# Patient Record
Sex: Male | Born: 1935 | Race: White | Hispanic: No | Marital: Single | Smoking: Former smoker
Health system: Southern US, Community
[De-identification: ages and names within clinical notes are randomized; demographics above are authoritative.]

## PROBLEM LIST (undated history)

## (undated) DIAGNOSIS — C801 Malignant (primary) neoplasm, unspecified: Secondary | ICD-10-CM

## (undated) DIAGNOSIS — C649 Malignant neoplasm of unspecified kidney, except renal pelvis: Secondary | ICD-10-CM

## (undated) DIAGNOSIS — Z87442 Personal history of urinary calculi: Secondary | ICD-10-CM

## (undated) DIAGNOSIS — I639 Cerebral infarction, unspecified: Secondary | ICD-10-CM

## (undated) DIAGNOSIS — M1990 Unspecified osteoarthritis, unspecified site: Secondary | ICD-10-CM

## (undated) DIAGNOSIS — N400 Enlarged prostate without lower urinary tract symptoms: Secondary | ICD-10-CM

## (undated) DIAGNOSIS — K219 Gastro-esophageal reflux disease without esophagitis: Secondary | ICD-10-CM

## (undated) DIAGNOSIS — I10 Essential (primary) hypertension: Secondary | ICD-10-CM

## (undated) HISTORY — DX: Malignant (primary) neoplasm, unspecified: C80.1

## (undated) HISTORY — PX: JOINT REPLACEMENT: SHX530

## (undated) HISTORY — PX: CHOLECYSTECTOMY: SHX55

## (undated) HISTORY — DX: Benign prostatic hyperplasia without lower urinary tract symptoms: N40.0

## (undated) HISTORY — DX: Malignant neoplasm of unspecified kidney, except renal pelvis: C64.9

## (undated) HISTORY — DX: Essential (primary) hypertension: I10

## (undated) HISTORY — DX: Cerebral infarction, unspecified: I63.9

---

## 1971-07-21 HISTORY — PX: SKIN CANCER EXCISION: SHX779

## 2002-07-20 HISTORY — PX: LAPAROTOMY: SHX154

## 2007-08-15 ENCOUNTER — Inpatient Hospital Stay (HOSPITAL_COMMUNITY): Admission: RE | Admit: 2007-08-15 | Discharge: 2007-08-18 | Payer: Self-pay | Admitting: Orthopedic Surgery

## 2007-09-13 ENCOUNTER — Encounter: Payer: Self-pay | Admitting: Orthopedic Surgery

## 2007-09-18 ENCOUNTER — Encounter: Payer: Self-pay | Admitting: Orthopedic Surgery

## 2007-10-19 ENCOUNTER — Encounter: Payer: Self-pay | Admitting: Orthopedic Surgery

## 2010-12-02 NOTE — H&P (Signed)
NAMEYENG, PERZ NO.:  [id number]   MEDICAL RECORD NO.:  [id number]         PATIENT TYPE:  LINP   LOCATION:                               FACILITY:  [facility]   PHYSICIAN:  Madlyn Frankel. Charlann Boxer, M.D.  DATE OF BIRTH:  06-17-1932   DATE OF ADMISSION:  08/15/2007  DATE OF DISCHARGE:                              HISTORY & PHYSICAL   PROCEDURE:  Right total knee arthroplasty.   CHIEF COMPLAINT:  Right knee pain.   HISTORY OF PRESENT ILLNESS:  A 77 year old male with a history of right  knee pain secondary to osteoarthritis that has been refractory to all  conservative treatment.  He has been presurgically assessed by Dr. Diona Fanti.   PAST MEDICAL HISTORY:  Significant for:  1. Osteoarthritis.  2. Hypertension.  3. Tinnitus.  4. Hemorrhoids.  5. Kaposi's sarcoma.   PAST SURGICAL HISTORY:  Cancer, Kaposi's sarcoma surgery in 1972, right  knee surgery in 1953, cholecystectomy in 2005, right knee scope  approximately 15 years ago in 1993.   FAMILY MEDICAL HISTORY:  Coronary artery disease, kidney disease,  cancer.   SOCIAL HISTORY:  He is divorced, retired.  Primary caregiver after  surgery will be his sister, who is a retired Engineer, civil (consulting).   DRUG ALLERGIES:  None.   MEDICATIONS:  1. HCTZ 25 mg 1 p.o. daily.  2. Terazosin 10 mg p.o. daily.  3. Potassium chloride 20 mEq 2 tablets once daily.  4. Metoprolol 40 mg 1 tablet twice daily.  5. Ranitidine 150 mg p.r.n.  6. Lisinopril 40 mg 1 tablet daily.  7. Baby aspirin 81 mg p.o. daily.  8. Tylenol Extra Strength 2 daily p.r.n.  9. Beta carotene 25,000 units 1 a day.  10.Zinc 50 mg 1 daily.  11.Multivitamin 1 daily.   REVIEW OF SYSTEMS:  None other than in HPI.   PHYSICAL EXAMINATION:  VITAL SIGNS:  Pulse 72, respirations 18, blood  pressure 136/84.  GENERAL:  Awake, alert and oriented, well-developed, well-nourished, in  no acute distress.  NECK:  Supple.  No carotid bruits.  CHEST/LUNGS:  Clear to  auscultation bilaterally.  BREASTS:  Deferred.  HEART:  Regular rate and rhythm without gallops, clicks, rubs or  murmurs.  ABDOMEN:  Soft, nontender, nondistended.  Bowel sounds present.  GENITOURINARY:  Deferred.  EXTREMITIES:  Dorsalis pedis pulse positive of right lower extremity.  He had varus deformity.  He does flex back to 110 degrees.  SKIN:  No cellulitis.  NEUROLOGIC:  Intact distal sensibilities.   LABORATORIES:  UA negative for leukocyte esterase, rare squamous  epithelial with few bacteria.  Basic metabolic showed calcium 8.4,  creatinine 0.8 with GFR greater than 60.  PSA 1.26.  Others pending.   EKG normal sinus rhythm.   Chest x-ray pending.   IMPRESSION:  Right knee osteoarthritis.   PLAN OF ACTION:  Right total knee replacement August 15, 2007, at  [facility] by surgeon Dr. Durene Romans.  Risks and  complications were discussed.   Postoperative medications including Lovenox, Robaxin, iron, aspirin,  Colace, MiraLax provided  at time of history and physical.  Postoperative  pain medication will be provided at time of surgery.     ______________________________  Yetta Glassman [address], [place]      Madlyn Frankel. Charlann Boxer, M.D.  Electronically Signed    BLM/MEDQ  D:  08/03/2007  T:  08/03/2007  Job:  [id number]   cc:   Diona Fanti  Fax: [phone]

## 2010-12-02 NOTE — Op Note (Signed)
NAMENORWOOD, QUEZADA NO.:  [id number]   MEDICAL RECORD NO.:  [id number]          PATIENT TYPE:  INP   LOCATION:  0006                         FACILITY:  [facility]   PHYSICIAN:  Madlyn Frankel. Charlann Boxer, M.D.  DATE OF BIRTH:  07-01-1932   DATE OF PROCEDURE:  08/15/2007  DATE OF DISCHARGE:                               OPERATIVE REPORT   PREOPERATIVE DIAGNOSIS:  Right knee osteoarthritis.   POSTOPERATIVE DIAGNOSIS:  Right knee osteoarthritis.   PROCEDURE:  Right total knee replacement.   COMPONENTS USED:  DePuy rotating platform posterior stabilized knee  system size 5 femur, 4 tibia, 20 mm insert and 41 patellar button.   SURGEON:  Madlyn Frankel. Charlann Boxer, M.D.   ASSISTANT:  Yetta Glassman. Mann, PA.   ANESTHESIA:  General plus regional femoral block.   COMPLICATIONS:  None.   DRAINS:  One.   TOURNIQUET TIME:  62 minutes at 250 mmHg.   INDICATIONS FOR PROCEDURE:  Mr. Knight is a 77 year old gentleman who  presented with bilateral severe knee arthritis, right more symptomatic  that the left. He had failed conservative measures and wished to discuss  knee replacement surgery. Risks of infection, DVT, component failure,  need for revision, postop stiffness, limited preoperative range of  motion were all discussed and reviewed in hopes of anticipation of pain  relief.   PROCEDURE IN DETAIL:  The patient was brought to the operative theater.  Once adequate anesthesia, preoperative antibiotics, Ancef, administered,  the patient was positioned supine, proximal thigh tourniquet was placed.  The patient noted to have at least 10 degree flexion contracture on  examination with varus deformity due to his severe knee arthritis.  The  leg was prescrubbed then prepped and draped in sterile fashion.  Midline  incision was made, followed by median arthrotomy revealing severe  degenerative changes patellofemoral and tricompartmentally.  Following  initial exposure, I did attend to the  patella.  First precut measurement  was approximately 28 mm.  I resected down to about 15 mm and used a 41  patellar button.  I took time at this point to expose and  debride  synovium.  Attention was now directed to the femur which was noted to be  severely arthritic.  I used an osteotome to debride the notch  osteophytes, remove cruciate ligaments.   I used the starting drill to open up the femoral canal, irrigated to  prevent fat emboli and then passed the intramedullary rod.  At  5  degrees valgus, I resected 12 mm of bone due to the flexion contractures  present. At this point, I sized the femur to be a size 5.  I placed the  cutting block based off posterior condylar axis which matched  perpendicular to [employer].  At this point, the anterior, posterior  and chamfer cuts were all made.  There is no notching.  At this point, I  made the box cut based off the lateral aspect of the distal femur,  removed the remaining osteophytes off the distal, medial and lateral  aspects of the femur.  Once  finished with the femur, I attended to the  tibia.  Tibial exposure obtained.  The patient was noted to have severe  posterior medial wear.  Based on the deformity that was present, I made  a choice at this point and resected bone based the medial side.  I set  the cutting block at 2 off the posterior medial aspect of the knee base  off the central portion of medial side.  When I made this resection it  was evident that the extension gap would then easily meet 10 mm.   At this point, I removed the medial osteophytes from the medial and  posterior medial aspect of the tibia.  I removed osteophytes off the  backside of the knee.  I used an osteotome to debride posterior medial  and posterior lateral osteophytes off the femoral condyles.   I finished off the tibial preparation using a size 4 tray.  The cut was  perpendicular in both planes, checked with an alignment rod.  I drilled  a keel  punch and did a trial reduction.  In doing this trial reduction,  then I felt that I was going to need at least 17.5 versus 20 mm poly.  At this point, all trial components were removed, the synovial capsule  layer was injected with 60 mL of 25% Marcaine with epinephrine and 1 mL  of Toradol.  I irrigated the knee out copiously with pulse lavage.  Further debridements carried out as necessary as final components were  opened.  At this point, cement was mixed.  The components were cemented  in position with the tibia first, then femur and patella.  I brought the  knee out to extension with 17.5, made the decision at this point that I  would use a 20 mm poly.   Once cement had cured, then I debrided the remaining cement from the  posterior aspect of the knee and medially and laterally, the final 20 mm  insert was placed.   The knee was very stable from extension to flexion, came out to  extension removing the patient's previous flexion gap.  At this point,  the knee was reirrigated, a medium Hemovac drain was placed deep.  I  reapproximated the extensor mechanism using #1 Vicryl with the knee in  flexion.  The remainder of the wound was closed in layers with 2-0  Vicryl and 4-0 running Monocryl.  The knee was cleaned, dried and  dressed sterilely with Steri-Strips and a bulky sterile wrap.  He was  brought to the recovery room extubated in stable condition.      Madlyn Frankel Charlann Boxer, M.D.  Electronically Signed     MDO/MEDQ  D:  08/15/2007  T:  08/15/2007  Job:  [id number]

## 2010-12-05 NOTE — Discharge Summary (Signed)
[name], [name] NO.:  [id number]   MEDICAL RECORD NO.:  [id number]          PATIENT TYPE:  INP   LOCATION:  1611                         FACILITY:  [facility]   PHYSICIAN:  Madlyn Frankel. Charlann Boxer, M.D.  DATE OF BIRTH:  06-Dec-1931   DATE OF ADMISSION:  08/15/2007  DATE OF DISCHARGE:  08/18/2007                               DISCHARGE SUMMARY   ADMISSION DIAGNOSES:  1. Osteoarthritis.  2. Hypertension.  3. Tinnitus.  4. Hemorrhoids.  5. Kaposi's sarcoma.   DISCHARGE DIAGNOSES:  1. Osteoarthritis.  2. Hypertension.  3. Tinnitus.  4. Hemorrhoids.  5. Kaposi's sarcoma.  6. Postoperative hypokalemia.   HISTORY OF PRESENT ILLNESS:  A 77 year old male with a history of right  knee pain secondary to osteoarthritis that has been refractory to all  conservative treatment.   CONSULTATIONS:  None.   PROCEDURE:  Right total knee replacement by Madlyn Frankel. Charlann Boxer, M.D. and  assistant Yetta Glassman. Mann, PA.   LABORATORY DATA:  Admission CBC; hematocrit 45.3 and at discharge was  stable at 28.5.  White cell differential was normal.  Coags; PTT 33, INR  1.  Routine chemistry preadmission; sodium 141, potassium 3.4, glucose  97, creatinine 0.79.  He did have a slight bump up in his glucose to 147  at discharge.  Sodium 138, potassium 3.2, glucose 128, creatinine 0.92.  Kidney function; GFR remained greater than 60, his calcium was 7.6 at  discharge.  UA was negative.   RADIOLOGY:  Chest two view; no active disease.   CARDIOLOGY:  EKG; normal sinus rhythm.   HOSPITAL COURSE:  The patient underwent a right total knee replacement,  tolerated the procedure well, and was admitted to the orthopedic floor.  His pain was well controlled throughout his course of stay.  He was  afebrile.  On day #1 hematocrit was 32.1, Hemovac was discontinued.  He  was neurovascularly intact.  Quads would fire but no straight leg raise.  Weightbearing as tolerated.  Evaluation showed that he would  do well  with home health care.   Postoperative activity no complaints with good progress.  A little dip  down in the potassium was replenished with K-Dur.  Dressing was changed  with no active drainage.  Quads were firing well.  Still was unable to  do a straight leg raise.  Weightbearing as tolerated with plans for  discharge the next day with excellent progress.   Postoperative day #3 doing well, no events, dressing was changed.  No  active drainage.  Neurovascularly intact in the right lower extremity.  Good quad function.   DISPOSITION:  Discharged home with home health care PT.   DISCHARGE PHYSICAL THERAPY:  Weightbearing as tolerated with the use of  rolling walker.  Goals of range of motion; 0 to 100 at two weeks, 0 to  120 at six weeks.   DIET:  Regular as tolerated by the patient.   WOUND CARE:  Keep wound dry.   DISCHARGE MEDICATIONS:  1. Lovenox.  2. Robaxin.  3. Aspirin.  4. Iron.  5. Colace.  6. MiraLax.  7. Vicodin.   HOME MEDICATIONS:  1. Metoprolol 50 mg one p.o. b.i.d.  2. Lisinopril 40 mg one p.o. q.a.m.  3. Hydrochlorothiazide 25 mg one p.o. daily.  4. Terazosin 10 mg one p.o. q.h.s.  5. Potassium 20 mEq two p.o. q.a.m.  6. Betacarotene 25,000 one p.o. q.a.m.  7. Zinc 50 mg one p.o. q.a.m.  8. Vitamin B6 100 mg one p.o. q.a.m.  9. Vitamin A 200 units one p.o. q.a.m.  10.Multivitamin daily.  11.Celebrex 200 mg one p.o. q.a.m. and one p.o. q.p.m. x2 weeks.   FOLLOWUP:  Follow up with Dr. Charlann Boxer, phone number [phone] in two weeks.     ______________________________  Yetta Glassman [address], [place]      Madlyn Frankel. Charlann Boxer, M.D.  Electronically Signed    BLM/MEDQ  D:  09/20/2007  T:  09/20/2007  Job:  [id number]   cc:   Diona Fanti  Fax: [phone]

## 2011-04-10 LAB — CBC
HCT: 28.5 — ABNORMAL LOW
HCT: 32.1 — ABNORMAL LOW
Hemoglobin: 11.1 — ABNORMAL LOW
Hemoglobin: 15.6
MCHC: 34.6
MCV: 85.8
Platelets: 132 — ABNORMAL LOW
Platelets: 162
RBC: 5.28
RDW: 12.7
RDW: 12.9
WBC: 6.7

## 2011-04-10 LAB — BASIC METABOLIC PANEL
BUN: 19
BUN: 20
CO2: 33 — ABNORMAL HIGH
Calcium: 7.6 — ABNORMAL LOW
Calcium: 8.7
Chloride: 102
Creatinine, Ser: 0.79
Creatinine, Ser: 0.92
GFR calc Af Amer: >60
GFR calc non Af Amer: >60
GFR calc non Af Amer: >60
GFR calc non Af Amer: >60
Glucose, Bld: 128 — ABNORMAL HIGH
Glucose, Bld: 147 — ABNORMAL HIGH
Potassium: 3.6
Sodium: 139
Sodium: 141

## 2011-04-10 LAB — URINALYSIS, ROUTINE W REFLEX MICROSCOPIC
Bilirubin Urine: NEGATIVE
Hgb urine dipstick: NEGATIVE
Ketones, ur: NEGATIVE
Nitrite: NEGATIVE
Specific Gravity, Urine: 1.019
pH: 6

## 2011-04-10 LAB — DIFFERENTIAL
Basophils Absolute: 0.1
Lymphocytes Relative: 13
Monocytes Absolute: 0.7
Monocytes Relative: 8
Neutro Abs: 5.9
Neutrophils Relative %: 73

## 2011-04-10 LAB — TYPE AND SCREEN: ABO/RH(D): AB POS

## 2011-04-10 LAB — ABO/RH: ABO/RH(D): AB POS

## 2011-04-10 LAB — PROTIME-INR: INR: 1

## 2011-04-10 LAB — APTT: aPTT: 33

## 2011-09-11 ENCOUNTER — Emergency Department: Payer: Self-pay | Admitting: Emergency Medicine

## 2014-07-09 ENCOUNTER — Inpatient Hospital Stay: Payer: Self-pay | Admitting: Internal Medicine

## 2014-07-09 LAB — COMPREHENSIVE METABOLIC PANEL
ALK PHOS: 109 U/L
AST: 28 U/L (ref 15–37)
Albumin: 3.3 g/dL — ABNORMAL LOW (ref 3.4–5.0)
Anion Gap: 7 (ref 7–16)
BUN: 24 mg/dL — ABNORMAL HIGH (ref 7–18)
Bilirubin,Total: 0.4 mg/dL (ref 0.2–1.0)
CREATININE: 0.89 mg/dL (ref 0.60–1.30)
Calcium, Total: 8.2 mg/dL — ABNORMAL LOW (ref 8.5–10.1)
Chloride: 106 mmol/L (ref 98–107)
Co2: 29 mmol/L (ref 21–32)
EGFR (Non-African Amer.): >60
Glucose: 108 mg/dL — ABNORMAL HIGH (ref 65–99)
OSMOLALITY: 288 (ref 275–301)
POTASSIUM: 3.2 mmol/L — AB (ref 3.5–5.1)
SGPT (ALT): 25 U/L
Sodium: 142 mmol/L (ref 136–145)
TOTAL PROTEIN: 6.6 g/dL (ref 6.4–8.2)

## 2014-07-09 LAB — CBC WITH DIFFERENTIAL/PLATELET
BASOS PCT: 1.4 %
Basophil #: 0.1 10*3/uL (ref 0.0–0.1)
Eosinophil #: 0.4 10*3/uL (ref 0.0–0.7)
Eosinophil %: 5.1 %
HCT: 46.8 % (ref 40.0–52.0)
HGB: 15.6 g/dL (ref 13.0–18.0)
Lymphocyte #: 1.5 10*3/uL (ref 1.0–3.6)
Lymphocyte %: 20.6 %
MCH: 28.4 pg (ref 26.0–34.0)
MCHC: 33.4 g/dL (ref 32.0–36.0)
MCV: 85 fL (ref 80–100)
MONO ABS: 0.7 x10 3/mm (ref 0.2–1.0)
Monocyte %: 9 %
NEUTROS ABS: 4.7 10*3/uL (ref 1.4–6.5)
Neutrophil %: 63.9 %
Platelet: 211 10*3/uL (ref 150–440)
RBC: 5.51 10*6/uL (ref 4.40–5.90)
RDW: 13.4 % (ref 11.5–14.5)
WBC: 7.3 10*3/uL (ref 3.8–10.6)

## 2014-07-09 LAB — URINALYSIS, COMPLETE
Bacteria: NONE SEEN
Bilirubin,UR: NEGATIVE
Glucose,UR: NEGATIVE mg/dL (ref 0–75)
Ketone: NEGATIVE
Leukocyte Esterase: NEGATIVE
NITRITE: NEGATIVE
PH: 6 (ref 4.5–8.0)
Protein: 30
RBC,UR: 92 /HPF (ref 0–5)
SQUAMOUS EPITHELIAL: NONE SEEN
Specific Gravity: 1.021 (ref 1.003–1.030)
WBC UR: 2 /HPF (ref 0–5)

## 2014-07-09 LAB — PROTIME-INR
INR: 1
PROTHROMBIN TIME: 12.8 s (ref 11.5–14.7)

## 2014-07-09 LAB — APTT: Activated PTT: 27.7 secs (ref 23.6–35.9)

## 2014-07-09 LAB — TROPONIN I

## 2014-07-09 LAB — MAGNESIUM: MAGNESIUM: 2.2 mg/dL

## 2014-07-10 LAB — CBC WITH DIFFERENTIAL/PLATELET
BASOS ABS: 0.1 10*3/uL (ref 0.0–0.1)
Basophil %: 1.7 %
EOS PCT: 4.5 %
Eosinophil #: 0.3 10*3/uL (ref 0.0–0.7)
HCT: 42.7 % (ref 40.0–52.0)
HGB: 14.3 g/dL (ref 13.0–18.0)
LYMPHS PCT: 15.4 %
Lymphocyte #: 1.1 10*3/uL (ref 1.0–3.6)
MCH: 28.5 pg (ref 26.0–34.0)
MCHC: 33.4 g/dL (ref 32.0–36.0)
MCV: 85 fL (ref 80–100)
MONO ABS: 0.7 x10 3/mm (ref 0.2–1.0)
Monocyte %: 10.1 %
NEUTROS ABS: 5.1 10*3/uL (ref 1.4–6.5)
Neutrophil %: 68.3 %
Platelet: 170 10*3/uL (ref 150–440)
RBC: 5.01 10*6/uL (ref 4.40–5.90)
RDW: 13.7 % (ref 11.5–14.5)
WBC: 7.4 10*3/uL (ref 3.8–10.6)

## 2014-07-10 LAB — LIPID PANEL
Cholesterol: 85 mg/dL (ref 0–200)
HDL Cholesterol: 23 mg/dL — ABNORMAL LOW (ref 40–60)
Ldl Cholesterol, Calc: 34 mg/dL (ref 0–100)
Triglycerides: 141 mg/dL (ref 0–200)
VLDL CHOLESTEROL, CALC: 28 mg/dL (ref 5–40)

## 2014-07-10 LAB — BASIC METABOLIC PANEL
ANION GAP: 7 (ref 7–16)
BUN: 21 mg/dL — ABNORMAL HIGH (ref 7–18)
CO2: 28 mmol/L (ref 21–32)
Calcium, Total: 8 mg/dL — ABNORMAL LOW (ref 8.5–10.1)
Chloride: 106 mmol/L (ref 98–107)
Creatinine: 0.71 mg/dL (ref 0.60–1.30)
EGFR (African American): >60
EGFR (Non-African Amer.): >60
Glucose: 85 mg/dL (ref 65–99)
Osmolality: 283 (ref 275–301)
Potassium: 2.7 mmol/L — ABNORMAL LOW (ref 3.5–5.1)
SODIUM: 141 mmol/L (ref 136–145)

## 2014-07-11 LAB — BASIC METABOLIC PANEL
Anion Gap: 6 — ABNORMAL LOW (ref 7–16)
BUN: 14 mg/dL (ref 7–18)
CALCIUM: 8 mg/dL — AB (ref 8.5–10.1)
CHLORIDE: 109 mmol/L — AB (ref 98–107)
Co2: 27 mmol/L (ref 21–32)
Creatinine: 0.74 mg/dL (ref 0.60–1.30)
EGFR (African American): >60
EGFR (Non-African Amer.): >60
GLUCOSE: 107 mg/dL — AB (ref 65–99)
OSMOLALITY: 284 (ref 275–301)
Potassium: 3 mmol/L — ABNORMAL LOW (ref 3.5–5.1)
Sodium: 142 mmol/L (ref 136–145)

## 2014-07-26 DIAGNOSIS — R531 Weakness: Secondary | ICD-10-CM | POA: Insufficient documentation

## 2014-07-26 DIAGNOSIS — R4701 Aphasia: Secondary | ICD-10-CM | POA: Insufficient documentation

## 2014-07-26 DIAGNOSIS — Z8673 Personal history of transient ischemic attack (TIA), and cerebral infarction without residual deficits: Secondary | ICD-10-CM | POA: Insufficient documentation

## 2014-08-20 DIAGNOSIS — E7800 Pure hypercholesterolemia, unspecified: Secondary | ICD-10-CM | POA: Insufficient documentation

## 2014-09-13 ENCOUNTER — Encounter: Payer: Self-pay | Admitting: Family Medicine

## 2014-10-08 ENCOUNTER — Ambulatory Visit: Payer: Self-pay | Admitting: Urology

## 2014-10-08 ENCOUNTER — Encounter
Admit: 2014-10-08 | Disposition: A | Discharge status: Home / Self Care | Payer: Self-pay | Attending: Family Medicine | Admitting: Family Medicine

## 2014-10-19 ENCOUNTER — Encounter
Admit: 2014-10-19 | Disposition: A | Discharge status: Home / Self Care | Payer: Self-pay | Attending: Family Medicine | Admitting: Family Medicine

## 2014-11-10 NOTE — H&P (Signed)
PATIENT NAME:  [name] [name], [name] [name] MR#:  [id number] DATE OF BIRTH:  Sep 08, 1931  DATE OF ADMISSION:  07/09/2014  REFERRING PHYSICIAN: Brunilda Payor A. Edd Fabian, MD    PRIMARY CARE PHYSICIAN: Dr. [facility].   CHIEF COMPLAINT: Slurred speech.    HISTORY OF PRESENT ILLNESS: An 81 year old Caucasian gentleman with a history of essential hypertension presenting with acute onset of slurred speech, which occurred about 24 hours prior to arrival to the Emergency Department. It occurred while he was at church.  He noted no further symptoms including paresthesias or weakness, numbness.  His speech was still abnormal and at the assistance of his family he decided to present to the hospital for further workup and evaluation. On initial workup was found to have the stroke of the right caudate nucleus on a head CT. Denies of any further symptoms. States speech is still slurred but somewhat improved from original onset. No further complaints at this time.   REVIEW OF SYSTEMS:   CONSTITUTIONAL: Denies any fevers, chills, fatigue, weakness.  EYES: Denies blurred vision, double vision, or eye pain.  EARS, NOSE, THROAT: Denies tinnitus, ear pain, hearing loss.  RESPIRATORY:  Denies cough, wheeze, shortness of breath.    CARDIOVASCULAR: Denies chest pain, palpitation, edema.  GASTROINTESTINAL: Denies nausea, vomiting, diarrhea, abdominal pain.  GENITOURINARY: Denies any diarrhea or hematuria.  ENDOCRINE: Denies nocturia, thyroid problems. HEMATOLOGIC AND LYMPHATIC:  Denies easy bruising or bleeding.  SKIN: Denies rash or lesions.  MUSCULOSKELETAL: Denies pain in neck, back, shoulder, knees, hips or arthritic symptoms.  NEUROLOGIC: Denies paralysis, paresthesias.  PSYCHIATRIC: Denies any anxiety or depressive symptoms.  Otherwise full review of systems performed by me is negative.    PAST MEDICAL HISTORY: Indigestion for which she takes as needed medication as well as essential hypertension.   SOCIAL HISTORY:  Denies any alcohol, tobacco, or drug usage.   FAMILY HISTORY: Positive for coronary artery disease.   ALLERGIES: PROPOXYPHENE.   HOME MEDICATIONS: Include aspirin 81 mg p.o. daily, Tylenol 500 mg p.o. b.i.d., lisinopril 40 mg p.o. daily, Flomax 0.4 mg p.o. daily, metoprolol 100 mg p.o. b.i.d., hydrochlorothiazide 25 mg p.o. daily, ranitidine 150 mg p.o. b.i.d. as needed for indigestion, potassium 20 mEq p.o. b.i.d., gluconate 50 mg p.o. daily, beta carotene 25,000 unit capsules once daily.   PHYSICAL EXAMINATION: Sign  VITAL SIGNS:  Temperature 98.1, heart rate 62, respirations 22, blood pressure 194/93, saturating 96% on room air, weight 96.3 kg, BMI of 31.4.  GENERAL: Well-nourished, well-developed Caucasian gentleman currently in no acute distress.  HEAD: Normocephalic, atraumatic.  EYES: Pupils equal, round, reactive to light. Extraocular muscles intact. No scleral icterus.  MOUTH: Moist mucosal membrane. Dentition intact. No abscess noted.  EAR, NOSE, THROAT: Clear without exudates. No external lesions.  NECK: Supple. No thyromegaly. No nodules. No JVD.  PULMONARY: Clear to auscultation bilaterally without wheeze, rales, rhonchi. No use of accessory muscles. Good respiratory effort.  CHEST: Nontender to palpation.  CARDIOVASCULAR: S1, S2, regular rate, rhythm. No murmurs, rubs, gallops. No edema. Pedal pulses 2+ bilaterally. GASTROINTESTINAL: Soft, nontender, nondistended. No masses. Positive bowel sounds. No hepatosplenomegaly.  MUSCULOSKELETAL: No swelling, clubbing, or edema. Range of motion full in all extremities.  NEUROLOGIC: Cranial nerves II through XII are intact. Sensation intact. Reflexes intact. pronator drift within normal limits.  Strength 5/5 in all extremities including proximal and distal flexion and extension. He does however have dysarthria with slurred speech.   SKIN: No ulceration, lesions, rash, cyanosis. Skin warm and dry. Turgor intact.  PSYCHIATRIC: Mood,  affect  within normal limits.  alert and oriented x 3. Insight, judgment intact.   LABORATORY DATA: Sodium 142, potassium 3.2, chloride 106, bicarbonate 29, BUN 24, creatinine 0.89, glucose 108,LFTs: Albumin 3.3, otherwise, within normal limits. Troponin less than 0.02. WBC 7.3, hemoglobin is 15.6, platelets of 211. CT head performed, which reveals atrophy with supratentorial small vessel disease, age uncertain, focal infarct in the head of the caudate nucleus on the right. No appreciable hemorrhage or mass effect.   ASSESSMENT AND PLAN:  An 81 year old gentleman with a history of hypertension essential presenting with slurred speech. He was found to have a stroke on head CT.  1.  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 Initiate aspirin and statin therapy. Place on telemetry. Neuro checks q. 4 hours. For risk factor modification and searching for etiology, check a transthoracic echocardiogram, a lipid panel, carotid Doppler. We will get physical therapy and speech therapy involved.  2.  Essential hypertension. We will allow permissive hypertension in the first 24 to 48 hours.  Treat him only if blood pressure greater than 220/120 or symptomatic at that time. Hydralazine 10 mg IV.  3.  Hypokalemia. Replace to goal of 4. We will also check a magnesium level with goal magnesium being around 2. Ex  4.  Venous thromboembolism prophylaxis with sequential compression devices.  We will avoid heparin in the first 24 to 48 hours.    CODE STATUS: The patient is full code.   TIME SPENT: 45 minutes.   ____________________________ Aaron Mose. Hower, MD dkh:AT D: 07/09/2014 22:26:07 ET T: 07/09/2014 23:04:03 ET JOB#: [id number]  cc: Aaron Mose. Hower, MD, <Dictator> DAVID Woodfin Ganja MD ELECTRONICALLY SIGNED 07/10/2014 1:50

## 2014-11-10 NOTE — Consult Note (Signed)
Referring Physician:  Lytle Butte :   Reason for Consult: Admit Date: 09-Jul-2014  Chief Complaint: "I was at church and noticed that my voice was all slurred"  Reason for Consult: CVA   History of Present Illness: History of Present Illness:   Mr. Tigges is an 81 yo RIGHT-handed man with PMH significant only for hypertension who presented for evaluation of sudden onset dysarthria two days ago. He was in church and after the service noticed that his voice was slurred as though he had been drinking. He did not notice any problems with balance or coordination, vertigo, diplopia, weakness, or numbness. He has moderate baseline tinnitus that did not significantly change. He disregarded these symptoms until he spoke with his son on the phone the next day, who noticed the patient's dysarthria and insisted that his father present for medical evaluation. He has never before had any similar symptoms.  ROS:  General denies complaints   HEENT no complaints   Lungs no complaints   Cardiac no complaints   GI no complaints   GU no complaints   Musculoskeletal joint pain   Extremities no complaints   Skin no complaints   Neuro dysarthria   Endocrine no complaints   Psych no complaints   Past Medical/Surgical Hx:  hypertension:   Cholecystectomy:   knee surgery:   Home Medications: Medication Instructions Last Modified Date/Time  hydrochlorothiazide 25 mg oral tablet 1 tab(s) orally once a day (in the morning) 21-Dec-15 20:34  potassium chloride 20 mEq oral tablet, extended release 1 tab(s) orally 2 times a day 21-Dec-15 20:34  Metoprolol Tartrate 100 mg oral tablet 1 tab(s) orally 2 times a day 21-Dec-15 20:34  lisinopril 40 mg oral tablet 1 tab(s) orally once a day (in the morning) 21-Dec-15 20:34  ranitidine 150 mg oral tablet 1 tab(s) orally 2 times a day, As Needed - for Indigestion, Heartburn 21-Dec-15 20:34  aspirin 81 mg oral tablet 1 tab(s) orally once a day (in the  morning) 21-Dec-15 20:34  Tylenol 500 mg oral tablet 1 tab(s) orally 2 times a day 21-Dec-15 20:34  beta-carotene 25000 units oral capsule 1 cap(s) orally once a day (in the morning) 21-Dec-15 20:34  zinc gluconate 50 mg oral tablet 1 tab(s) orally once a day (in the morning) 21-Dec-15 20:34  tamsulosin 0.4 mg oral capsule 1 cap(s) orally once a day (in the evening) 21-Dec-15 20:34   Allergies:  Propoxyphene: GI Distress  Social/Family History: Social History: Denies smoking, EtOH, or illicit drug use.  Family History: No FH of stroke or early MI.   Vital Signs: **Vital Signs.:   22-Dec-15 12:00  Vital Signs Type Routine  Temperature Temperature (F) 97.3  Celsius 36.2  Temperature Source oral  Pulse Pulse 73  Respirations Respirations 18  Systolic BP Systolic BP 099  Diastolic BP (mmHg) Diastolic BP (mmHg) 83  Mean BP 110  Pulse Ox % Pulse Ox % 94  Pulse Ox Activity Level  At rest  Oxygen Delivery Room Air/ 21 %   EXAM: Well-developed, well-nourished, in NAD. No conjunctival injection or scleral edema. Oropharynx clear. No carotid bruits auscultated. Normal S1, S2 and regular cardiac rhythm on exam. Lungs clear to auscultation bilaterally. Abdomen soft and nontender. Peripheral pulses palpated. No clubbing, cyanosis, or edema in extremities.  MENTAL STATUS: Alert and oriented to person, place, and time. Language fluent and appropriate. Cognition and memory conversationally intact. CRANIAL NERVES: Visual fields full to confrontation. PERRL. EOMI. Facial sensation intact. Facial muscles full and  symmetric. Hearing intact to finger rub. Uvula midline with symmetric palatal elevation. Tongue midline without fasciculations. Speech is notable for scanning dysarthria. MOTOR: Normal bulk and tone. Strength 5/5 in deltoids, biceps, triceps, wrist flexors and extensors, and hand intrinsics bilaterally. Strength 5/5 in iliopsoas, glutes, hamstrings, quads, and tib ant bilaterally. REFLEXES:  1+ in biceps, triceps, absent patella, and 1+ achilles bilaterally. Flexor plantar responses bilaterally. SENSORY: Intact to vibration and pinprick throughout. COORDINATION: Mild dysmetria and ataxia on right finger-nose and heel-shin maneuvers, normal coordination on the left. GAIT: Not evaluated.  NIH Stroke Scale Score: 3 (2 for ataxia in 2 limbs, 1 for dysarthria).  Lab Results: LabObservation:  22-Dec-15 08:52   OBSERVATION Reason for Test  Hepatic:  21-Dec-15 17:36   Bilirubin, Total 0.4  Alkaline Phosphatase 109 (46-116 NOTE: New Reference Range 02/06/14)  SGPT (ALT) 25 (14-63 NOTE: New Reference Range 02/06/14)  SGOT (AST) 28  Total Protein, Serum 6.6  Albumin, Serum  3.3  Routine Chem:  21-Dec-15 17:36   Magnesium, Serum 2.2 (1.8-2.4 THERAPEUTIC RANGE: 4-7 mg/dL TOXIC: > 10 mg/dL  -----------------------)  22-Dec-15 04:45   Cholesterol, Serum 85  Triglycerides, Serum 141  HDL (INHOUSE)  23  VLDL Cholesterol Calculated 28  LDL Cholesterol Calculated 34 (Result(s) reported on 10 Jul 2014 at 05:33AM.)  Glucose, Serum 85  BUN  21  Creatinine (comp) 0.71  Sodium, Serum 141  Potassium, Serum  2.7  Chloride, Serum 106  CO2, Serum 28  Calcium (Total), Serum  8.0  Anion Gap 7  Osmolality (calc) 283  eGFR (African American) >60  eGFR (Non-African American) >60 (eGFR values <20m/min/1.73 m2 may be an indication of chronic kidney disease (CKD). Calculated eGFR, using the MRDR Study equation, is useful in  patients with stable renal function. The eGFR calculation will not be reliable in acutely ill patients when serum creatinine is changing rapidly. It is not useful in patients on dialysis. The eGFR calculation may not be applicable to patients at the low and high extremes of body sizes, pregnant women, and vegetarians.)  Cardiac:  21-Dec-15 17:36   Troponin I < 0.02 (0.00-0.05 0.05 ng/mL or less: NEGATIVE  Repeat testing in 3-6 hrs  if clinically  indicated. >0.05 ng/mL: POTENTIAL  MYOCARDIAL INJURY. Repeat  testing in 3-6 hrs if  clinically indicated. NOTE: An increase or decrease  of 30% or more on serial  testing suggests a  clinically important change)  Routine UA:  21-Dec-15 20:02   Color (UA) Yellow  Clarity (UA) Clear  Glucose (UA) Negative  Bilirubin (UA) Negative  Ketones (UA) Negative  Specific Gravity (UA) 1.021  Blood (UA) 2+  pH (UA) 6.0  Protein (UA) 30 mg/dL  Nitrite (UA) Negative  Leukocyte Esterase (UA) Negative (Result(s) reported on 09 Jul 2014 at 09:28PM.)  RBC (UA) 92 /HPF  WBC (UA) 2 /HPF  Bacteria (UA) NONE SEEN  Epithelial Cells (UA) NONE SEEN  Mucous (UA) PRESENT (Result(s) reported on 09 Jul 2014 at 09:28PM.)  Routine Coag:  21-Dec-15 17:36   Prothrombin 12.8  INR 1.0 (INR reference interval applies to patients on anticoagulant therapy. A single INR therapeutic range for coumarins is not optimal for all indications; however, the suggested range for most indications is 2.0 - 3.0. Exceptions to the INR Reference Range may include: Prosthetic heart valves, acute myocardial infarction, prevention of myocardial infarction, and combinations of aspirin and anticoagulant. The need for a higher or lower target INR must be assessed individually. Reference: The Pharmacology and Management of  the Vitamin K  antagonists: the seventh ACCP Conference on Antithrombotic and Thrombolytic Therapy. OEVOJ.5009 Sept:126 (3suppl): [id number]. A HCT value >55% may artifactually increase the PT.  In one study,  the increase was an average of 25%. Reference:  "Effect on Routine and Special Coagulation Testing Values of Citrate Anticoagulant Adjustment in Patients with High HCT Values." American Journal of Clinical Pathology 2006;126:400-405.)  Activated PTT (APTT) 27.7 (A HCT value >55% may artifactually increase the APTT. In one study, the increase was an average of 19%. Reference: "Effect on Routine and Special  Coagulation Testing Values of Citrate Anticoagulant Adjustment in Patients with High HCT Values." American Journal of Clinical Pathology 2006;126:400-405.)  Routine Hem:  22-Dec-15 04:45   WBC (CBC) 7.4  RBC (CBC) 5.01  Hemoglobin (CBC) 14.3  Hematocrit (CBC) 42.7  Platelet Count (CBC) 170  MCV 85  MCH 28.5  MCHC 33.4  RDW 13.7  Neutrophil % 68.3  Lymphocyte % 15.4  Monocyte % 10.1  Eosinophil % 4.5  Basophil % 1.7  Neutrophil # 5.1  Lymphocyte # 1.1  Monocyte # 0.7  Eosinophil # 0.3  Basophil # 0.1 (Result(s) reported on 10 Jul 2014 at 05:36AM.)   Radiology Results: [place]:    22-Dec-15 09:40, US Carotid Doppler Bilateral  US Carotid Doppler Bilateral   REASON FOR EXAM:    cva  COMMENTS:       PROCEDURE: [place]  - US CAROTID DOPPLER BILATERAL  - Jul 10 2014  9:40AM     CLINICAL DATA:  Hypertension, stroke symptoms, slurred speech    EXAM:  BILATERAL CAROTID DUPLEX ULTRASOUND    TECHNIQUE:  Pearline Cables scale imaging, color Doppler and duplex ultrasound were  performed of bilateral carotid and vertebral arteries in the neck.    COMPARISON:  None.  FINDINGS:  Criteria: Quantification of carotid stenosis is based on velocity  parameters that correlate the residual internal carotid diameter  with NASCET-based stenosis levels, using the diameter of the distal  internal carotid lumen as the denominator for stenosis measurement.    The following velocity measurements were obtained:    RIGHT    ICA:  88/24 cm/sec    CCA:  38/18 cm/sec    SYSTOLIC ICA/CCA RATIO:  1.0  DIASTOLIC ICA/CCA RATIO:  1.4    ECA:  81 cm/sec    LEFT    ICA:  68/19 cm/sec    CCA:  29/93 cm/sec    SYSTOLIC ICA/CCA RATIO:  0.8    DIASTOLIC ICA/CCA RATIO:  1.0    ECA:  79 cm/sec  RIGHT CAROTID ARTERY: Minor echogenic shadowing plaque formation. No  hemodynamically significant right ICA stenosis, velocity elevation,  or turbulent flow. Degree of narrowing less than 50%.    RIGHT VERTEBRAL  ARTERY:  Antegrade    LEFT CAROTID ARTERY: Similar scattered minor echogenic plaque  formation. No hemodynamically significant left ICA stenosis,  velocity elevation, or turbulent flow.    LEFT VERTEBRAL ARTERY:  Antegrade     IMPRESSION:  Minor carotid atherosclerosis. No hemodynamically significant ICA  stenosis by ultrasound. Degree of narrowing less than 50%  bilaterally.      Electronically Signed    By: Daryll Brod M.D.    On: 07/10/2014 09:52         Verified By: Earl Gala, M.D.,  CT:    21-Dec-15 17:48, CT Head Without Contrast  CT Head Without Contrast   REASON FOR EXAM:    slurred speech  COMMENTS:  PROCEDURE: CT  - CT HEAD WITHOUT CONTRAST  - Jul 09 2014  5:48PM     CLINICAL DATA:  Slurred speech and altered mental status    EXAM:  CT HEAD WITHOUT CONTRAST    TECHNIQUE:  Contiguous axial images were obtained from the base of the skull  through the vertex without intravenous contrast.    COMPARISON:  None.  FINDINGS:  There is moderate diffuse atrophy. There is no intracranial mass,  hemorrhage, extra-axial fluid collection, or midline shift. There is  patchy small vessel disease throughout the centra semiovale  bilaterally. There is decreased attenuation in the head of the  caudate nucleus on the right, age uncertain. Elsewhere gray-white  compartments are normal. The bony calvarium appears intact. The  mastoid air cells are clear.     IMPRESSION:  Atrophy with supratentorial small vessel disease. Age uncertain  focal infarct in the head of the caudate nucleus on the right. No  appreciable hemorrhage or mass effect.    Electronically Signed    By: Lowella Grip M.D.    On: 07/09/2014 18:18         Verified By: Leafy Kindle. WOODRUFF, M.D.,   Impression/Recommendations: Recommendations:   Mr. Nordahl is an 81 yo man with a history of HTN who presents for sudden onset dysarthria. His neurologic exam shows cerebellar dysarthria and  right arm and leg ataxia concerning for a right cerebellar infarct. Brain CT did not show an acute process, though I suspect he truly does have an acute right cerebellar stroke. He was not a candidate for IV thrombolytics due to minor symptoms and delayed time from onset at presentation. Brain MRI without contrast to evaluate for presence and pattern of ischemic strokesIntracranial vascular imaging with brain MRA or CTA to evaluate for vascular stenosis or occlusionTTE to evaluate for cardiac sources of emboliFasting lipid panel and HbA1cStart high-potency high-dose statin for secondary stroke risk reduction; recommend atorvastatin 71m dailyASA 867mfor secondary stroke risk reductionConsider prolonged cardiac monitoring to evaluate for paroxysmal cardiac dysrhythmiaPT, OT, and Speech swallowing evaluations you for the opportunity to participate in Mr. WaSiresare. Please page Neurology with further questions. HeMar DaringMD   Electronic Signatures: HeCarmin RichmondMD)  (Signed 22-Dec-15 13:16)  Authored: REFERRING PHYSICIAN, Consult, History of Present Illness, Review of Systems, PAST MEDICAL/SURGICAL HISTORY, HOME MEDICATIONS, ALLERGIES, Social/Family History, NURSING VITAL SIGNS, Physical Exam-, LAB RESULTS, RADIOLOGY RESULTS, Recommendations   Last Updated: 22-Dec-15 13:16 by HeCarmin RichmondMD)

## 2014-11-14 NOTE — Discharge Summary (Signed)
PATIENT NAME:  [name] [name], [name] [name] MR#:  [id number] DATE OF BIRTH:  09/16/1931  DATE OF ADMISSION:  07/09/2014 DATE OF DISCHARGE:  07/11/2014  DISCHARGE DIAGNOSES: 1.  Acute cerebrovascular accident with residual dysarthria.  2.  Hypokalemia, on potassium 3 on discharge. 3.  Hypertension.   DISCHARGE MEDICATIONS: 1.  Potassium chloride 20 mEq p.o. b.i.d.  2.  Metoprolol tartrate 100 mg p.o. b.i.d.  3.  Lisinopril 40 mg p.o. daily.  4.  Ranitidine 150 mg p.o. b.i.d. as needed for indigestion.  5.  Aspirin 81 mg p.o. daily.  6.  Tamsulosin 0.4 milligrams daily.  7.  Atorvastatin 20 mg p.o. at bedtime. This is a new medication.   CONSULTS: Neurology and speech therapy.   PERTINENT LABORATORIES ON DAY OF DISCHARGE: Sodium 142, potassium 3, creatinine 0.74.   PERTINENT STUDIES: MRI showed acute left pontine infarct, LDL of 34.   OTHER STUDIES: Carotid Dopplers showed no stenosis. Echo showed no acute abnormalities.   BRIEF HOSPITAL COURSE: Acute cerebral vascular accident. The patient initially came in with symptoms of acute cerebrovascular accident with dysarthria and mild facial droop. The initial CT was inconclusive. It did show a focal infarct of an indeterminate time frame of the caudate nucleus on the right.  MRI of the brain did show acute left pontine infarct consistent with his symptoms. He was evaluated by speech therapy because of dysarthria and was recommended to be placed on nectar-thick diet, low-cholesterol. He was placed on a statin and continued with aspirin therapy.  We slowly decrease his blood pressure. Resume his metoprolol and his lisinopril, but will hold off on  hydrochlorothiazide due to them low potassium and the hesitation to lower his blood pressure too quickly.   PLAN: To follow up as an outpatient to control blood pressure within 10 days. He will see speech therapy as an outpatient as well. The patient is in stable condition to be discharged to home with speech therapy.     ____________________________ Dion Body, MD kl:[id number] D: 07/11/2014 08:20:47 ET T: 07/11/2014 11:18:20 ET JOB#: [id number]  cc: Dion Body, MD, <Dictator> Dion Body MD ELECTRONICALLY SIGNED 08/02/2014 12:01

## 2014-11-18 NOTE — Discharge Summary (Signed)
PATIENT NAME:  [name] [name], [name] [name] MR#:  [id number] DATE OF BIRTH:  1931-12-11  DATE OF ADMISSION:  07/09/2014 DATE OF DISCHARGE:  07/12/2014  ADDENDUM:   Please see the discharge summary by Dr. Dion Body from 07/11/2014.  The patient was found to have acute stroke by MRI, as well as accelerated hypertension. Due to his stroke and difficulty with mobility, the decision was made to have him go to a skilled nursing facility. He was transferred to a skilled nursing facility in stable condition on 07/12/2014. Physical therapy and occupational therapy were to see the patient. He will follow up with the skilled nursing physician as needed. His diet was to be no added salt.   DISCHARGE MEDICATIONS:  1. Potassium 20 mEq p.o. b.i.d.  2. Metoprolol tartrate 100 mg p.o. b.i.d.  3. Lisinopril 40 mg p.o. daily.  4. Ranitidine 150 mg p.o. b.i.d.  5. Aspirin 81 mg p.o. daily.  6. Tamsulosin 0.4 mg p.o. daily.  7. Atorvastatin 20 mg p.o. at bedtime.     ____________________________ Adin Hector, MD bjk:jh D: 07/22/2014 11:34:43 ET T: 07/22/2014 16:14:36 ET JOB#: [id number]  cc: Adin Hector, MD, <Dictator> Ramonita Lab MD ELECTRONICALLY SIGNED 07/24/2014 13:17

## 2014-11-20 ENCOUNTER — Encounter: Payer: Self-pay | Admitting: Occupational Therapy

## 2014-11-20 ENCOUNTER — Ambulatory Visit: Payer: Self-pay | Admitting: Physical Therapy

## 2014-11-21 ENCOUNTER — Ambulatory Visit: Payer: Medicare Other | Admitting: Occupational Therapy

## 2014-11-21 ENCOUNTER — Ambulatory Visit: Payer: Medicare Other | Attending: Family Medicine

## 2014-11-21 ENCOUNTER — Encounter: Payer: Self-pay | Admitting: Occupational Therapy

## 2014-11-21 VITALS — BP 155/78 | HR 66

## 2014-11-21 DIAGNOSIS — R279 Unspecified lack of coordination: Secondary | ICD-10-CM | POA: Diagnosis not present

## 2014-11-21 DIAGNOSIS — N400 Enlarged prostate without lower urinary tract symptoms: Secondary | ICD-10-CM

## 2014-11-21 DIAGNOSIS — I69398 Other sequelae of cerebral infarction: Secondary | ICD-10-CM | POA: Insufficient documentation

## 2014-11-21 DIAGNOSIS — IMO0002 Reserved for concepts with insufficient information to code with codable children: Secondary | ICD-10-CM

## 2014-11-21 DIAGNOSIS — R531 Weakness: Secondary | ICD-10-CM

## 2014-11-21 HISTORY — DX: Benign prostatic hyperplasia without lower urinary tract symptoms: N40.0

## 2014-11-21 NOTE — Therapy (Signed)
[name] [name] [facility] SERVICES [address] [place], [place], [address] Phone: [phone]   Fax:  [phone]  Physical Therapy Treatment  Patient Details  Name: [name] [name] MRN: [id number] Date of Birth: 04-28-1932 Referring Provider:  Dion Body, MD  Encounter Date: 11/21/2014      PT End of Session - 11/21/14 1400    Visit Number 11   Number of Visits 16   Date for PT Re-Evaluation 12/12/14   Authorization Type visit Sep 25, 2022 of G codes   PT Start Time 9728  pt arrived 15 min late for apt   PT Stop Time 1350   PT Time Calculation (min) 35 min   Equipment Utilized During Treatment Gait belt   Behavior During Therapy [facility] for tasks assessed/performed      Past Medical History  Diagnosis Date  . Stroke [id number]  . Hypertension   . Cancer 45 years ago melanoma  . Enlarged prostate 11/21/14    Past Surgical History  Procedure Laterality Date  . Cholecystectomy N/A   . Joint replacement Bilateral 4-5 years ago    Filed Vitals:   11/21/14 1324  BP: 155/78  Pulse: 66  SpO2: 96%    Visit Diagnosis:  Weakness      Subjective Assessment - 11/21/14 1355    Subjective pt reports he is undergoing testing for enlarged prostate. pt reports no new fall history, however care giver claimed he fell today when PT arrived pt today. pt reports no pain.    Patient Stated Goals improve balance    Currently in Pain? No/denies            [facility] PT Assessment - 11/21/14 0001    Assessment   Medical Diagnosis PT- CVA   Onset Date 07/08/14   Precautions   Precautions Fall   Balance Screen   Has the patient fallen in the past 6 months No  denies   Has the patient had a decrease in activity level because of a fear of falling?  No   Is the patient reluctant to leave their home because of a fear of falling?  No   Home Environment   Living Enviornment Private residence   Prior Function   Level of Independence Independent with basic  ADLs;Independent with homemaking with ambulation   Cognition   Overall Cognitive Status Within Functional Limits for tasks assessed   6 Minute Walk- Baseline   6 Minute Walk- Baseline yes  89f                     [facility] Adult PT Treatment/Exercise - 11/21/14 0001    High Level Balance   High Level Balance Activities Backward walking and forward walking heel/toe no HHA       Standing resisted hip flexion, abduction ,adduction and extension with    red    Band x 10 each way 3 step SLS hold with min A for weight shifting x 3558m Toe taps on box- cues for weight shifting and CGA x 25 fwd and x 15 sideways bilaterally Large rocker board - L/R weight shifting no UE 5s hold x 20 Holding large rocker board static with horiz. Head turns x 58m36mHolding large rocker board static with eyes open/closed 15s x 10 with min A to CGA for safety             PT Education - 11/21/14 1359    Education provided Yes   Education Details Fall  precautions, side stepping at counter top   Person(s) Educated Patient   Methods Explanation   Comprehension Verbalized understanding       Outcome Measures from progress note preformed 11/05/14 Berg balance test: 49/56 5x sit to stand: 12s wihtout HHA Six min walk test: 984f       PT Long Term Goals - 11/21/14 1404    PT LONG TERM GOAL #1   Title Patient (> 648years old) will complete five times sit to stand test in < 15 seconds indicating an increased LE strength and improved balance    Time 8   Period Weeks   Status Achieved   PT LONG TERM GOAL #2   Title . Patient will increase six minute walk test distance to >1000 for progression to community ambulator and improve gait ability    Time 8   Period Weeks   Status Partially Met   PT LONG TERM GOAL #3   Title . Patient will increase Berg Balance score by > 6 points to demonstrate decreased fall risk during functional activities    Time 8   Period Weeks   Status Achieved   PT  LONG TERM GOAL #4   Title . Patient will be independent in gait tasks without assistive device to improve independence in home and community tasks   Time 8   Period Weeks   Status Achieved   PT LONG TERM GOAL #5   Title Pt. will tolerate 5 seconds of single leg stance without loss of balance to improve ability to get in and out of shower safely    Time 8   Period Weeks   Status Partially Met   Additional Long Term Goals   Additional Long Term Goals Yes   PT LONG TERM GOAL #6   Title . Patient will be independent in home exercise program to improve strength/mobility for better functional independence with ADLs    Time 8   Period Weeks   Status Achieved   PT LONG TERM GOAL #7   Title . Patient will increase BLE gross strength to 4+/5 as to improve functional strength for independent gait, increased standing tolerance and increased ADL ability   Time 8   Period Weeks   Status Partially Met               Plan - 11/21/14 1401    Clinical Impression Statement Conflicting subjective information today, pts cargiver/neighbor claimed he fell today, however pt denies. There were no signs of injury. focused session on higher level balance tasks, SLS and weight shifting, especially to the R side. pt demonstrates need for contact guard to min A needed for stability during balance exercises. pt demonstrates good motivation and endurnace thorugh treatment today.    Pt will benefit from skilled therapeutic intervention in order to improve on the following deficits Difficulty walking;Decreased activity tolerance;Decreased balance;Decreased strength;Decreased mobility;Decreased endurance;Decreased coordination   Rehab Potential Good   PT Frequency 2x / week   PT Duration 8 weeks   PT Treatment/Interventions Gait training;Neuromuscular re-education;Therapeutic exercise;Therapeutic activities;Balance training   Consulted and Agree with Plan of Care Patient        Problem List There are no  active problems to display for this patient.  AGorden [name] [name] [name], PT, DPT #[id number] [name] [name] 11/21/2014, 2:09 PM  CSt. PaulMAIN RAd[facility] ValleySERVICES [address] [address]Phone: [phone]  Fax:  [phone]

## 2014-11-21 NOTE — Therapy (Signed)
[place] MAIN [facility] SERVICES [address] [place], [place], [address] Phone: [phone]   Fax:  [phone]  Occupational Therapy Treatment  Patient Details  Name: [name] [name] MRN: [id number] Date of Birth: 04-18-1932 Referring Provider:  No ref. provider found  Encounter Date: 11/21/2014      OT End of Session - 11/21/14 1428    Visit Number 8   Number of Visits 24   Date for OT Re-Evaluation 12/14/14   Authorization Type 8   Authorization Time Period 2   Authorization - Visit Number 8   OT Start Time 1103   OT Stop Time 1436   OT Time Calculation (min) 44 min   Activity Tolerance Patient tolerated treatment well   Behavior During Therapy WFL for tasks assessed/performed      Past Medical History  Diagnosis Date  . Stroke [id number]  . Hypertension   . Cancer 45 years ago melanoma  . Enlarged prostate 11/21/14    Past Surgical History  Procedure Laterality Date  . Cholecystectomy N/A   . Joint replacement Bilateral 4-5 years ago    There were no vitals filed for this visit.  Visit Diagnosis:  Lack of coordination due to stroke      Subjective Assessment - 11/21/14 1419    Subjective  I am getting in the shower by myself now.                              OT Education - 11/21/14 1424    Education provided Yes   Person(s) Educated Patient   Methods Explanation;Demonstration   Comprehension Returned demonstration     For fine motor; Used Purdue peg board all three components and removed alternating fingers for pegs. Completed card flipping with fingers only. Picked up coins largest to smallest and placed in coin counter. Practiced hand writing with built up handle. Writing was very sloppy, but 60% legible. Cues needed for proper technique.        OT Long Term Goals - 11/21/14 1446    OT LONG TERM GOAL #1   Title Will reduce right hand edema to be with 20 mm of left hand as measured the way it  was in evaluation in 12 weeks.    Status Partially Met   OT LONG TERM GOAL #2   Title Will improve shoulder flextion and external rotation to resume chopping wood in 12 weeks   Status On-going   OT LONG TERM GOAL #3   Title Will improve grip strength to be able to wipe self after a BM with his right hand in 12 weeks   Status Partially Met   OT LONG TERM GOAL #4   Title Will improve grip in his hand to be able to eat with his right hand in 12 weeks.   Status Partially Met   OT LONG TERM GOAL #5   Title Will impove right hand function to be able to right legibley with his right hand with or with out assitive devices in 12 weeks    Status Partially Met   Long Term Additional Goals   Additional Long Term Goals Yes   OT LONG TERM GOAL #6   Title Will imporve right hand grip to be able to brush teeth with right hand in 12 weeks   Status Partially Met   OT LONG TERM GOAL #7   Title Patient will improve hand writing to  be able to write checks in 12 weeks   Status On-going               Plan - 11/21/14 1443    Clinical Impression Statement Patient continues to benefit from Occupational Therapy for fine motor and higher level ADL training.   Pt will benefit from skilled therapeutic intervention in order to improve on the following deficits (Retired) Decreased coordination   Rehab Potential Good   OT Frequency 2x / week   OT Duration 12 weeks   OT Treatment/Interventions Self-care/ADL training;Neuromuscular education   Plan Continue with fine motor tasks to aid in ADL independentc   Consulted and Agree with Plan of Care Patient        Problem List There are no active problems to display for this patient.   Sharon Mt 11/21/2014, 2:57 PM  [place] MAIN [facility] SERVICES [address] [place], [place], [address] Phone: [phone]   Fax:  [phone]

## 2014-11-21 NOTE — Patient Instructions (Signed)
Patient instructed in proper technique for fine motor.

## 2014-11-26 ENCOUNTER — Ambulatory Visit: Payer: Medicare Other | Admitting: Occupational Therapy

## 2014-11-26 ENCOUNTER — Ambulatory Visit: Payer: Medicare Other

## 2014-11-26 ENCOUNTER — Encounter: Payer: Self-pay | Admitting: Occupational Therapy

## 2014-11-26 DIAGNOSIS — R531 Weakness: Secondary | ICD-10-CM

## 2014-11-26 DIAGNOSIS — IMO0002 Reserved for concepts with insufficient information to code with codable children: Secondary | ICD-10-CM

## 2014-11-26 DIAGNOSIS — I69398 Other sequelae of cerebral infarction: Secondary | ICD-10-CM | POA: Diagnosis not present

## 2014-11-26 NOTE — Therapy (Signed)
[place] MAIN [facility] SERVICES [address] [place], [place], [address] Phone: [phone]   Fax:  [phone]  Physical Therapy Treatment  Patient Details  Name: NORMAL RECINOS MRN: [id number] Date of Birth: 04-05-1932 Referring Provider:  No ref. provider found  Encounter Date: 11/26/2014      PT End of Session - 11/26/14 1418    Visit Number 11   Number of Visits 16   Date for PT Re-Evaluation 12/12/14   Authorization Type visit 30-Aug-2022 of G codes   PT Start Time 2841   PT Stop Time 1410   PT Time Calculation (min) 25 min      Past Medical History  Diagnosis Date  . Stroke [id number]  . Hypertension   . Cancer 45 years ago melanoma  . Enlarged prostate 11/21/14    Past Surgical History  Procedure Laterality Date  . Cholecystectomy N/A   . Joint replacement Bilateral 4-5 years ago    There were no vitals filed for this visit.  Visit Diagnosis:  Lack of coordination due to stroke      Subjective Assessment - 11/26/14 1347    Subjective pt reports he thinks he is doing better. reports he can walk easier    Patient Stated Goals improve balance    Currently in Pain? No/denies                                      PT Long Term Goals - 11/21/14 1404    PT LONG TERM GOAL #1   Title Patient (> 79 years old) will complete five times sit to stand test in < 15 seconds indicating an increased LE strength and improved balance    Time 8   Period Weeks   Status Achieved   PT LONG TERM GOAL #2   Title . Patient will increase six minute walk test distance to >1000 for progression to community ambulator and improve gait ability    Time 8   Period Weeks   Status Partially Met   PT LONG TERM GOAL #3   Title . Patient will increase Berg Balance score by > 6 points to demonstrate decreased fall risk during functional activities    Time 8   Period Weeks   Status Achieved   PT LONG TERM GOAL #4   Title . Patient  will be independent in gait tasks without assistive device to improve independence in home and community tasks   Time 8   Period Weeks   Status Achieved   PT LONG TERM GOAL #5   Title Pt. will tolerate 5 seconds of single leg stance without loss of balance to improve ability to get in and out of shower safely    Time 8   Period Weeks   Status Partially Met   Additional Long Term Goals   Additional Long Term Goals Yes   PT LONG TERM GOAL #6   Title . Patient will be independent in home exercise program to improve strength/mobility for better functional independence with ADLs    Time 8   Period Weeks   Status Achieved   PT LONG TERM GOAL #7   Title . Patient will increase BLE gross strength to 4+/5 as to improve functional strength for independent gait, increased standing tolerance and increased ADL ability   Time 8   Period Weeks   Status Partially Met  Pt was seen- did a 5 min warm up- no charge on the Nustep L3. Pt Then asked to use the restroom, where he soiled himself. Pt was aided with cleaning with male associate PT. Pt decided to end session at that time and confirmed apt for next week.          Problem List There are no active problems to display for this patient. Gorden Harms. Halei Hanover, PT, DPT [phone]   Patirica Longshore 11/26/2014, 2:19 PM  [place] MAIN [facility] SERVICES [address] [place], [place], [address] Phone: [phone]   Fax:  [phone]

## 2014-11-26 NOTE — Therapy (Signed)
[place] MAIN [facility] SERVICES [address] [place], [place], [address] Phone: [phone]   Fax:  [phone]  Occupational Therapy Treatment  Patient Details  Name: [name] [name] MRN: [id number] Date of Birth: [birth year] Referring Provider:  No ref. provider found  Encounter Date: 11/26/2014      OT End of Session - 11/26/14 1433    OT Stop Time 1345   Activity Tolerance Patient tolerated treatment well   Behavior During Therapy [facility] for tasks assessed/performed      Past Medical History  Diagnosis Date  . Stroke [id number]  . Hypertension   . Cancer 45 years ago melanoma  . Enlarged prostate 11/21/14    Past Surgical History  Procedure Laterality Date  . Cholecystectomy N/A   . Joint replacement Bilateral 4-5 years ago    There were no vitals filed for this visit.  Visit Diagnosis:  Lack of coordination due to stroke  Weakness      Subjective Assessment - 11/26/14 1318    Subjective  I am eating with my right hand with out any assistive device all the time.   Currently in Pain? No/denies     For fine motor preparing for fine motor ADL:  Completed card flipping with fingers only, Used velcro checkers for opposition and pinch, Used Purdue peg board with pegs only, then removed with alternating fingers, Picked up coins largest to smallest and placed in coin counter. Patient completed hand witting with built up handle with 50% legibility (not neat at all) using built up handle. Is now eating with right hand exclusively, with out assistive device.                         OT Education - 11/26/14 1320    Education provided Yes   Education Details Instructed in safety when cooking. (patient burned his right small finger.   Person(s) Educated Patient   Methods Explanation   Comprehension Verbalized understanding             OT Long Term Goals - 11/21/14 1446    OT LONG TERM GOAL #1   Title Will reduce right  hand edema to be with 20 mm of left hand as measured the way it was in evaluation in 12 weeks.    Status Partially Met   OT LONG TERM GOAL #2   Title Will improve shoulder flextion and external rotation to resume chopping wood in 12 weeks   Status On-going   OT LONG TERM GOAL #3   Title Will improve grip strength to be able to wipe self after a BM with his right hand in 12 weeks   Status Partially Met   OT LONG TERM GOAL #4   Title Will improve grip in his hand to be able to eat with his right hand in 12 weeks.   Status Partially Met   OT LONG TERM GOAL #5   Title Will impove right hand function to be able to right legibley with his right hand with or with out assitive devices in 12 weeks    Status Partially Met   Long Term Additional Goals   Additional Long Term Goals Yes   OT LONG TERM GOAL #6   Title Will imporve right hand grip to be able to brush teeth with right hand in 12 weeks   Status Partially Met   OT LONG TERM GOAL #7   Title Patient will improve hand  writing to be able to write checks in 12 weeks   Status On-going               Plan - 11/26/14 1433    Clinical Impression Statement Patient doing better and is doing all basic ADL by himself and may higher level ADL. Son still needs to write bills secondary to not good handwritting.   Pt will benefit from skilled therapeutic intervention in order to improve on the following deficits (Retired) Decreased coordination   Rehab Potential Good   OT Frequency 2x / week   OT Duration 12 weeks   OT Treatment/Interventions Self-care/ADL training;Neuromuscular education        Problem List There are no active problems to display for this patient.   Sharon Mt 11/26/2014, 2:48 PM  [place] MAIN [facility] SERVICES [address] [place], [place], [address] Phone: [phone]   Fax:  [phone]

## 2014-12-03 ENCOUNTER — Encounter: Payer: Self-pay | Admitting: Physical Therapy

## 2014-12-03 ENCOUNTER — Ambulatory Visit: Payer: Medicare Other | Admitting: Physical Therapy

## 2014-12-03 ENCOUNTER — Ambulatory Visit: Payer: Medicare Other | Admitting: Occupational Therapy

## 2014-12-03 DIAGNOSIS — IMO0002 Reserved for concepts with insufficient information to code with codable children: Secondary | ICD-10-CM

## 2014-12-03 DIAGNOSIS — R531 Weakness: Secondary | ICD-10-CM

## 2014-12-03 DIAGNOSIS — I69398 Other sequelae of cerebral infarction: Secondary | ICD-10-CM | POA: Diagnosis not present

## 2014-12-03 NOTE — Therapy (Signed)
[place] MAIN [facility] SERVICES [address] [place], [place], [address] Phone: [phone]   Fax:  [phone]  Occupational Therapy Treatment  Patient Details  Name: [name] [name] MRN: [id number] Date of Birth: Oct 18, 1931 Referring Provider:  Dion Body, MD  Encounter Date: 12/03/2014      OT End of Session - 12/03/14 1345    Visit Number 10   Number of Visits 24   Date for OT Re-Evaluation 12/14/14   OT Start Time 1302   OT Stop Time 1345   OT Time Calculation (min) 43 min   Activity Tolerance Patient tolerated treatment well   Behavior During Therapy [facility] for tasks assessed/performed      Past Medical History  Diagnosis Date  . Stroke [id number]  . Hypertension   . Cancer 45 years ago melanoma  . Enlarged prostate 11/21/14    Past Surgical History  Procedure Laterality Date  . Cholecystectomy N/A   . Joint replacement Bilateral 4-5 years ago    There were no vitals filed for this visit.  Visit Diagnosis:  Lack of coordination due to stroke      Subjective Assessment - 12/03/14 1340    Subjective  I am still eating only with my right hand    Completed fine motor to aid in ADL : Used Judy/instructo board to flip pegs. Picked up coins largest to smallest and placed in coin counter. Used grooved peg board placing pegs in order and removed alternating fingers. Completed card flipping with wrist.                         OT Education - 12/03/14 1343    Education provided Yes   Education Details On the importance of using right hand.   Person(s) Educated Patient   Methods Explanation   Comprehension Verbalized understanding             OT Long Term Goals - 11/21/14 1446    OT LONG TERM GOAL #1   Title Will reduce right hand edema to be with 20 mm of left hand as measured the way it was in evaluation in 12 weeks.    Status Partially Met   OT LONG TERM GOAL #2   Title Will improve shoulder  flextion and external rotation to resume chopping wood in 12 weeks   Status On-going   OT LONG TERM GOAL #3   Title Will improve grip strength to be able to wipe self after a BM with his right hand in 12 weeks   Status Partially Met   OT LONG TERM GOAL #4   Title Will improve grip in his hand to be able to eat with his right hand in 12 weeks.   Status Partially Met   OT LONG TERM GOAL #5   Title Will impove right hand function to be able to right legibley with his right hand with or with out assitive devices in 12 weeks    Status Partially Met   Long Term Additional Goals   Additional Long Term Goals Yes   OT LONG TERM GOAL #6   Title Will imporve right hand grip to be able to brush teeth with right hand in 12 weeks   Status Partially Met   OT LONG TERM GOAL #7   Title Patient will improve hand writing to be able to write checks in 12 weeks   Status On-going  Plan - 12/04/14 1353    Pt will benefit from skilled therapeutic intervention in order to improve on the following deficits (Retired) Decreased coordination   Rehab Potential Good   OT Frequency 2x / week   OT Duration 12 weeks   OT Treatment/Interventions Self-care/ADL training;Neuromuscular education          G-Codes - Dec 04, 2014 1333    Functional Assessment Tool Used ADL status   Functional Limitation Self care   Self Care Current Status (M4037) At least 20 percent but less than 40 percent impaired, limited or restricted   Self Care Goal Status (V4360) At least 1 percent but less than 20 percent impaired, limited or restricted      Problem List There are no active problems to display for this patient.  Sharon Mt, MS/OTR/L Sharon Mt 04-Dec-2014, 2:02 PM  [place] MAIN [facility] SERVICES [address] [place], [place], [address] Phone: [phone]   Fax:  [phone]

## 2014-12-03 NOTE — Therapy (Signed)
[place] MAIN [facility] SERVICES [address] [place], [place], [address] Phone: [phone]   Fax:  [phone]  Physical Therapy Treatment/Discharge Summary  Patient Details  Name: [name] [name] MRN: [id number] Date of Birth: 1932-05-26 Referring Provider:  Dion Body, MD  Encounter Date: 12/03/2014      PT End of Session - 12/03/14 1442    Visit Number 12   Number of Visits 16   Date for PT Re-Evaluation 12/03/14   Authorization Type visit 10-04-22 of G codes   PT Start Time 23-Sep-1344   PT Stop Time 1429   PT Time Calculation (min) 43 min   Equipment Utilized During Treatment Gait belt   Behavior During Therapy [facility] for tasks assessed/performed      Past Medical History  Diagnosis Date  . Stroke [id number]  . Hypertension   . Cancer 45 years ago melanoma  . Enlarged prostate 11/21/14    Past Surgical History  Procedure Laterality Date  . Cholecystectomy N/A   . Joint replacement Bilateral 4-5 years ago    There were no vitals filed for this visit.  Visit Diagnosis:  Weakness      Subjective Assessment - 12/03/14 1353    Subjective Patient reports that he is doing well. He is still using SPC (all the time); He is not using walker anymore. Patient reports that he keeps walker by his bed at night just in case. Pt reports that he is driving some. He was able to drive to church. Reports no difficulty walking around church and was able to use the steps   Patient Stated Goals improve balance    Currently in Pain? No/denies            [facility] PT Assessment - 12/03/14 0001    Sit to Stand   Comments 5 times sit<>stand, 12 sec without HHA; no change from last reassessment on 11/07/14 which was 12 sec;   6 Minute Walk- Baseline   6 Minute Walk- Baseline yes  Ambulated 1040 feet without AD; improved from 935 ft on 4/20   Berg Balance Test   Sit to Stand Able to stand without using hands and stabilize independently   Standing Unsupported  Able to stand safely 2 minutes   Sitting with Back Unsupported but Feet Supported on Floor or Stool Able to sit safely and securely 2 minutes   Stand to Sit Sits safely with minimal use of hands   Transfers Able to transfer safely, minor use of hands   Standing Unsupported with Eyes Closed Able to stand 10 seconds safely   Standing Ubsupported with Feet Together Able to place feet together independently and stand 1 minute safely   From Standing, Reach Forward with Outstretched Arm Can reach confidently >25 cm (10")   From Standing Position, Pick up Object from Floor Able to pick up shoe safely and easily   From Standing Position, Turn to Look Behind Over each Shoulder Looks behind from both sides and weight shifts well   Turn 360 Degrees Able to turn 360 degrees safely but slowly   Standing Unsupported, Alternately Place Feet on Step/Stool Able to stand independently and safely and complete 8 steps in 20 seconds   Standing Unsupported, One Foot in Front Able to take small step independently and hold 30 seconds   Standing on One Leg Tries to lift leg/unable to hold 3 seconds but remains standing independently   Total Score 49       TREATMENT:  Warm up on Nustep level 3 x4 min (unbilled); Instructed patient in 5 times sit<>stand, 6 min walk, Berg Balance assessment; please see above outcome measures;  Reeducated patient in HEP with instruction to increase repetition of LE strengthening for increased endurance/strength in RLE;  Patient was also educated in gait safety with and without cane with instruction to use SPC when outside home walking by himself or when walking on uneven surfaces; instructed patient when his son is nearby to try walking outside without AD to return to PLOF. Patient does demonstrate increased RLE toe drag with prolonged ambulation.                       PT Education - 12/03/14 1442    Education provided Yes   Education Details HEP and safety with cane  use   Person(s) Educated Patient   Methods Explanation;Demonstration   Comprehension Verbalized understanding;Returned demonstration;Verbal cues required             PT Long Term Goals - 12/03/14 1418    PT LONG TERM GOAL #1   Title Patient (> 83 years old) will complete five times sit to stand test in < 15 seconds indicating an increased LE strength and improved balance    Time 8   Period Weeks   Status Achieved   PT LONG TERM GOAL #2   Title . Patient will increase six minute walk test distance to >1000 for progression to community ambulator and improve gait ability    Time 8   Period Weeks   Status Achieved   PT LONG TERM GOAL #3   Title . Patient will increase Berg Balance score by > 6 points to demonstrate decreased fall risk during functional activities    Time 8   Period Weeks   Status Achieved   PT LONG TERM GOAL #4   Title . Patient will be independent in gait tasks without assistive device to improve independence in home and community tasks   Time 8   Period Weeks   Status Achieved   PT LONG TERM GOAL #5   Title Pt. will tolerate 5 seconds of single leg stance without loss of balance to improve ability to get in and out of shower safely    Time 8   Period Weeks   Status Partially Met   PT LONG TERM GOAL #6   Title . Patient will be independent in home exercise program to improve strength/mobility for better functional independence with ADLs    Time 8   Period Weeks   Status Achieved   PT LONG TERM GOAL #7   Title . Patient will increase BLE gross strength to 4+/5 as to improve functional strength for independent gait, increased standing tolerance and increased ADL ability   Time 8   Period Weeks   Status Achieved               Plan - 12/03/14 1443    Clinical Impression Statement Instructed patient in Fraser Balance assessment, 10 meter walk and other outcome measures; please see attached objective measures. Patient demonstrates no significant change  with sit<>stand and Berg Balance test. He does however demonstrate improved 6 min walk distance. He is modified independent in gait tasks with [facility]; Reeducated patient in safety awareness with gait tasks with and without AD for return to independence in ADLs. Patient is independent in HEP; Recommend discharge from PT at this time. Patient agreeable   Pt will benefit from  skilled therapeutic intervention in order to improve on the following deficits Difficulty walking;Decreased activity tolerance;Decreased balance;Decreased strength;Decreased mobility;Decreased endurance;Decreased coordination   Rehab Potential Good   PT Frequency 2x / week   PT Duration 8 weeks   PT Treatment/Interventions Gait training;Neuromuscular re-education;Therapeutic exercise;Therapeutic activities;Balance training   Consulted and Agree with Plan of Care Patient          G-Codes - 2014/12/30 1458    Functional Assessment Tool Used Merrilee Jansky Balance assessment, 6 min walk, 5 times sit<>stand   Functional Limitation Mobility: Walking and moving around   Mobility: Walking and Moving Around Goal Status [phone]) At least 1 percent but less than 20 percent impaired, limited or restricted   Mobility: Walking and Moving Around Discharge Status [phone]) At least 1 percent but less than 20 percent impaired, limited or restricted      Problem List There are no active problems to display for this patient.  PHYSICAL THERAPY DISCHARGE SUMMARY  Visits from Start of Care: 12  Current functional level related to goals / functional outcomes: See above in assessment. Able to ambulate modified independent with SPC   Remaining deficits: RLE foot drag with fatigue, impaired balance, uses cane with prolonged ambulation   Education / Equipment: See above under treatment  Plan: Patient agrees to discharge.  Patient goals were met. Patient is being discharged due to meeting the stated rehab goals.  ?????       Alessandra Grout, PT,  DPT, [phone] December 30, 2014 3:08 PM   [place] MAIN [facility] SERVICES [address] [place], [place], [address] Phone: [phone]   Fax:  [phone]

## 2014-12-05 ENCOUNTER — Encounter: Payer: Self-pay | Admitting: Occupational Therapy

## 2014-12-05 ENCOUNTER — Ambulatory Visit: Payer: Medicare Other | Admitting: Physical Therapy

## 2014-12-05 ENCOUNTER — Ambulatory Visit: Payer: Medicare Other | Admitting: Occupational Therapy

## 2014-12-05 DIAGNOSIS — IMO0002 Reserved for concepts with insufficient information to code with codable children: Secondary | ICD-10-CM

## 2014-12-05 DIAGNOSIS — I69398 Other sequelae of cerebral infarction: Secondary | ICD-10-CM | POA: Diagnosis not present

## 2014-12-05 NOTE — Therapy (Signed)
[place] MAIN [facility] SERVICES [address] [place], [place], [address] Phone: [phone]   Fax:  [phone]  Occupational Therapy Treatment  Patient Details  Name: [name] [name] MRN: [id number] Date of Birth: 16-Dec-1931 Referring Provider:  Dion Body, MD  Encounter Date: 12/05/2014      OT End of Session - 12/05/14 1502    Visit Number 11   Number of Visits 24   Date for OT Re-Evaluation 12/14/14      Past Medical History  Diagnosis Date  . Stroke [id number]  . Hypertension   . Cancer 45 years ago melanoma  . Enlarged prostate 11/21/14    Past Surgical History  Procedure Laterality Date  . Cholecystectomy N/A   . Joint replacement Bilateral 4-5 years ago    There were no vitals filed for this visit.  Visit Diagnosis:  Lack of coordination due to stroke      Subjective Assessment - 12/05/14 1453    Subjective  I wish I could write my own checks.   Currently in Pain? No/denies    Mobilization of wrist and fingers to prepare for fine motor. Performed fine motor to prepare for hand writing with Completed card flipping with fingers only.         Picked up coins largest to smallest and placed in coin counter.  Removed nuts of various sizes from bolts and replaced back on bolt. Practiced handwriting using built up handle and wrote checks. Still not legible wether writing or printing.                        OT Long Term Goals - 11/21/14 1446    OT LONG TERM GOAL #1   Title Will reduce right hand edema to be with 20 mm of left hand as measured the way it was in evaluation in 12 weeks.    Status Partially Met   OT LONG TERM GOAL #2   Title Will improve shoulder flextion and external rotation to resume chopping wood in 12 weeks   Status On-going   OT LONG TERM GOAL #3   Title Will improve grip strength to be able to wipe self after a BM with his right hand in 12 weeks   Status Partially Met   OT LONG  TERM GOAL #4   Title Will improve grip in his hand to be able to eat with his right hand in 12 weeks.   Status Partially Met   OT LONG TERM GOAL #5   Title Will impove right hand function to be able to right legibley with his right hand with or with out assitive devices in 12 weeks    Status Partially Met   Long Term Additional Goals   Additional Long Term Goals Yes   OT LONG TERM GOAL #6   Title Will imporve right hand grip to be able to brush teeth with right hand in 12 weeks   Status Partially Met   OT LONG TERM GOAL #7   Title Patient will improve hand writing to be able to write checks in 12 weeks   Status On-going               Plan - 12/05/14 1504    Clinical Impression Statement Patient continues to improve. Only check writing needing assist with   Pt will benefit from skilled therapeutic intervention in order to improve on the following deficits (Retired) Decreased coordination   OT  Treatment/Interventions Self-care/ADL training;Neuromuscular education        Problem List There are no active problems to display for this patient.  Sharon Mt, MS/OTR/L Sharon Mt 12/05/2014, 4:08 PM  [place] MAIN [facility] SERVICES [address] [place], [place], [address] Phone: [phone]   Fax:  [phone]

## 2014-12-07 ENCOUNTER — Other Ambulatory Visit: Payer: Self-pay | Admitting: Urology

## 2014-12-07 DIAGNOSIS — N2889 Other specified disorders of kidney and ureter: Secondary | ICD-10-CM

## 2014-12-10 ENCOUNTER — Encounter: Payer: Self-pay | Admitting: Occupational Therapy

## 2014-12-10 ENCOUNTER — Ambulatory Visit: Payer: Medicare Other | Admitting: Occupational Therapy

## 2014-12-10 ENCOUNTER — Ambulatory Visit: Payer: Medicare Other | Admitting: Physical Therapy

## 2014-12-10 DIAGNOSIS — IMO0002 Reserved for concepts with insufficient information to code with codable children: Secondary | ICD-10-CM

## 2014-12-10 DIAGNOSIS — I69398 Other sequelae of cerebral infarction: Secondary | ICD-10-CM | POA: Diagnosis not present

## 2014-12-10 NOTE — Therapy (Signed)
[place] MAIN [facility] SERVICES [address] [place], [place], [address] Phone: [phone]   Fax:  [phone]  Occupational Therapy Treatment  Patient Details  Name: [name] [name] MRN: [id number] Date of Birth: May 17, 1932 Referring Provider:  Dion Body, MD  Encounter Date: 12/10/2014      OT End of Session - 12/10/14 1722    OT Start Time 1430   OT Stop Time 1515   OT Time Calculation (min) 45 min   Activity Tolerance Patient tolerated treatment well   Behavior During Therapy [facility] for tasks assessed/performed      Past Medical History  Diagnosis Date  . Stroke [id number]  . Hypertension   . Cancer 45 years ago melanoma  . Enlarged prostate 11/21/14    Past Surgical History  Procedure Laterality Date  . Cholecystectomy N/A   . Joint replacement Bilateral 4-5 years ago    There were no vitals filed for this visit.  Visit Diagnosis:  Lack of coordination due to stroke      Subjective Assessment - 12/10/14 1443    Subjective  I think we are making some progress.   Currently in Pain? No/denies    Fine motor to aid in ADL: Used velcro checkers for opposition and pinch, Used grooved peg board placing pegs in order and removed alternating fingers. Picked up coins largest to smallest and placed in coin counter.  Practiced writing checks with built up handle, mostly not readable, then with weight on wrist which improved some, then used pen stabilizer, a large improvement. Gave patient written material on ordering one.                                OT Long Term Goals - 11/21/14 1446    OT LONG TERM GOAL #1   Title Will reduce right hand edema to be with 20 mm of left hand as measured the way it was in evaluation in 12 weeks.    Status Partially Met   OT LONG TERM GOAL #2   Title Will improve shoulder flextion and external rotation to resume chopping wood in 12 weeks   Status On-going   OT LONG TERM GOAL  #3   Title Will improve grip strength to be able to wipe self after a BM with his right hand in 12 weeks   Status Partially Met   OT LONG TERM GOAL #4   Title Will improve grip in his hand to be able to eat with his right hand in 12 weeks.   Status Partially Met   OT LONG TERM GOAL #5   Title Will impove right hand function to be able to right legibley with his right hand with or with out assitive devices in 12 weeks    Status Partially Met   Long Term Additional Goals   Additional Long Term Goals Yes   OT LONG TERM GOAL #6   Title Will imporve right hand grip to be able to brush teeth with right hand in 12 weeks   Status Partially Met   OT LONG TERM GOAL #7   Title Patient will improve hand writing to be able to write checks in 12 weeks   Status On-going               Plan - 12/10/14 1724    Clinical Impression Statement Hand writing starting to improve some with    Pt  will benefit from skilled therapeutic intervention in order to improve on the following deficits (Retired) Decreased coordination   Rehab Potential Good   OT Treatment/Interventions Self-care/ADL training;Neuromuscular education   Consulted and Agree with Plan of Care Patient        Problem List There are no active problems to display for this patient.  Sharon Mt, MS/OTR/L Sharon Mt 12/10/2014, 5:31 PM  [place] MAIN [facility] SERVICES [address] [place], [place], [address] Phone: [phone]   Fax:  [phone]

## 2014-12-12 ENCOUNTER — Ambulatory Visit: Payer: Medicare Other | Admitting: Physical Therapy

## 2014-12-12 ENCOUNTER — Ambulatory Visit: Payer: Medicare Other | Admitting: Occupational Therapy

## 2014-12-12 ENCOUNTER — Encounter: Payer: Self-pay | Admitting: Occupational Therapy

## 2014-12-12 DIAGNOSIS — I69398 Other sequelae of cerebral infarction: Secondary | ICD-10-CM | POA: Diagnosis not present

## 2014-12-12 DIAGNOSIS — IMO0002 Reserved for concepts with insufficient information to code with codable children: Secondary | ICD-10-CM

## 2014-12-12 NOTE — Therapy (Signed)
[place] MAIN [facility] SERVICES [address] [place], [place], [address] Phone: [phone]   Fax:  [phone]  Occupational Therapy Treatment  Patient Details  Name: [name] [name] MRN: [id number] Date of Birth: 1932-04-17 Referring Provider:  Dion Body, MD  Encounter Date: 12/12/2014      OT End of Session - 12/12/14 1447    Visit Number 13   Number of Visits 24   Date for OT Re-Evaluation 01/14/15   OT Start Time 21      Past Medical History  Diagnosis Date  . Stroke [id number]  . Hypertension   . Cancer 45 years ago melanoma  . Enlarged prostate 11/21/14    Past Surgical History  Procedure Laterality Date  . Cholecystectomy N/A   . Joint replacement Bilateral 4-5 years ago    There were no vitals filed for this visit.  Visit Diagnosis:  No diagnosis found.      Subjective Assessment - 12/12/14 1445    Subjective  Agreed that the writing is now a priority.    Fine motor completed to aid in ADL: Used Purdue peg board all three components and removed alternating fingers for pegs. Picked up coins largest to smallest and placed in coin counter, Completed card flipping with fingers only,  Completed hand writing with pen stabilizer then added a weight to wrist which did improve legibility some.                          OT Education - 12/12/14 1446    Education provided Yes   Education Details fall prevention    Person(s) Educated Patient   Methods Explanation   Comprehension Verbalized understanding             OT Long Term Goals - 11/21/14 1446    OT LONG TERM GOAL #1   Title Will reduce right hand edema to be with 20 mm of left hand as measured the way it was in evaluation in 12 weeks.    Status Partially Met   OT LONG TERM GOAL #2   Title Will improve shoulder flextion and external rotation to resume chopping wood in 12 weeks   Status On-going   OT LONG TERM GOAL #3   Title Will  improve grip strength to be able to wipe self after a BM with his right hand in 12 weeks   Status Partially Met   OT LONG TERM GOAL #4   Title Will improve grip in his hand to be able to eat with his right hand in 12 weeks.   Status Partially Met   OT LONG TERM GOAL #5   Title Will impove right hand function to be able to right legibley with his right hand with or with out assitive devices in 12 weeks    Status Partially Met   Long Term Additional Goals   Additional Long Term Goals Yes   OT LONG TERM GOAL #6   Title Will imporve right hand grip to be able to brush teeth with right hand in 12 weeks   Status Partially Met   OT LONG TERM GOAL #7   Title Patient will improve hand writing to be able to write checks in 12 weeks   Status On-going               Plan - 12/12/14 1500    Clinical Impression Statement Patient making progress tward goals. Writting is still  a problem.   Pt will benefit from skilled therapeutic intervention in order to improve on the following deficits (Retired) Decreased coordination   Rehab Potential Good   Clinical Impairments Affecting Rehab Potential Coordination interfers with check writing.   OT Treatment/Interventions Self-care/ADL training;Neuromuscular education   OT Home Exercise Plan Sent home with pen stabilizer to practice until his arrives.   Consulted and Agree with Plan of Care Patient        Problem List There are no active problems to display for this patient.  Sharon Mt, MS/OTR/L Sharon Mt 12/12/2014, 3:06 PM  [place] MAIN [facility] SERVICES [address] [place], [place], [address] Phone: [phone]   Fax:  [phone]

## 2014-12-18 ENCOUNTER — Ambulatory Visit: Payer: Self-pay | Admitting: Physical Therapy

## 2014-12-18 ENCOUNTER — Encounter: Payer: Self-pay | Admitting: Occupational Therapy

## 2014-12-19 ENCOUNTER — Ambulatory Visit: Payer: Self-pay | Admitting: Physical Therapy

## 2014-12-19 ENCOUNTER — Ambulatory Visit: Payer: Medicare Other | Attending: Orthopedic Surgery | Admitting: Occupational Therapy

## 2014-12-19 DIAGNOSIS — I629 Nontraumatic intracranial hemorrhage, unspecified: Secondary | ICD-10-CM | POA: Diagnosis present

## 2014-12-19 DIAGNOSIS — R531 Weakness: Secondary | ICD-10-CM

## 2014-12-19 DIAGNOSIS — R279 Unspecified lack of coordination: Secondary | ICD-10-CM | POA: Insufficient documentation

## 2014-12-19 DIAGNOSIS — IMO0002 Reserved for concepts with insufficient information to code with codable children: Secondary | ICD-10-CM

## 2014-12-19 NOTE — Therapy (Signed)
[place] MAIN [facility] SERVICES [address] [place], [place], [address] Phone: [phone]   Fax:  [phone]  Occupational Therapy Treatment  Patient Details  Name: [name] [name] MRN: [id number] Date of Birth: 08/24/1931 Referring Provider:  No ref. provider found  Encounter Date: 12/19/2014 Late entry due to lack of closing encounter on the date of service.     OT End of Session - 12/19/14 1341    OT Stop Time 26      Past Medical History  Diagnosis Date  . Stroke [id number]  . Hypertension   . Cancer 45 years ago melanoma  . Enlarged prostate 11/21/14    Past Surgical History  Procedure Laterality Date  . Cholecystectomy N/A   . Joint replacement Bilateral 4-5 years ago    There were no vitals filed for this visit.  Visit Diagnosis:  Lack of coordination due to stroke  Weakness Completed fine motor tasks to aid in ADL: Used Purdue peg board with pegs only, then removed with alternating fingers. Used grooved peg board placing pegs in order and removed alternating fingers. Completed card flipping with fingers only Completed writing tasks with out use of adapted device. Much more legible.                                OT Long Term Goals - 11/21/14 1446    OT LONG TERM GOAL #1   Title Will reduce right hand edema to be with 20 mm of left hand as measured the way it was in evaluation in 12 weeks.    Status Partially Met   OT LONG TERM GOAL #2   Title Will improve shoulder flextion and external rotation to resume chopping wood in 12 weeks   Status On-going   OT LONG TERM GOAL #3   Title Will improve grip strength to be able to wipe self after a BM with his right hand in 12 weeks   Status Partially Met   OT LONG TERM GOAL #4   Title Will improve grip in his hand to be able to eat with his right hand in 12 weeks.   Status Partially Met   OT LONG TERM GOAL #5   Title Will impove right hand function to be  able to right legibley with his right hand with or with out assitive devices in 12 weeks    Status Partially Met   Long Term Additional Goals   Additional Long Term Goals Yes   OT LONG TERM GOAL #6   Title Will imporve right hand grip to be able to brush teeth with right hand in 12 weeks   Status Partially Met   OT LONG TERM GOAL #7   Title Patient will improve hand writing to be able to write checks in 12 weeks   Status On-going               Plan - 12/19/14 1347    Clinical Impression Statement Patient contiues to improve hand writing.        Problem List There are no active problems to display for this patient. Sharon Mt, MS/OTR/L  Sharon Mt 12/19/2014, 1:56 PM  [place] MAIN [facility] SERVICES [address] [place], [place], [address] Phone: [phone]   Fax:  [phone]

## 2014-12-24 ENCOUNTER — Other Ambulatory Visit: Payer: Medicare Other

## 2014-12-25 ENCOUNTER — Ambulatory Visit: Payer: Medicare Other | Attending: Family Medicine | Admitting: Occupational Therapy

## 2014-12-25 DIAGNOSIS — IMO0002 Reserved for concepts with insufficient information to code with codable children: Secondary | ICD-10-CM

## 2014-12-25 DIAGNOSIS — R279 Unspecified lack of coordination: Secondary | ICD-10-CM | POA: Insufficient documentation

## 2014-12-25 DIAGNOSIS — R531 Weakness: Secondary | ICD-10-CM

## 2014-12-25 DIAGNOSIS — I6980 Unspecified sequelae of other cerebrovascular disease: Secondary | ICD-10-CM | POA: Diagnosis present

## 2014-12-25 NOTE — Patient Instructions (Signed)
Instructed patient on how to use dycem for bottles and jars. Issued dycem to patient.

## 2014-12-25 NOTE — Therapy (Signed)
[place] MAIN [facility] SERVICES [address] [place], [place], [address] Phone: [phone]   Fax:  [phone]  Occupational Therapy Treatment  Patient Details  Name: [name] [name] MRN: [id number] Date of Birth: [birth year] Referring Provider:  Dion Body, MD  Encounter Date: 12/25/2014      OT End of Session - 12/25/14 1509    Visit Number 15   Number of Visits 24   Date for OT Re-Evaluation 01/14/15   OT Start Time 1432   OT Stop Time 1520   OT Time Calculation (min) 48 min   Equipment Utilized During Treatment Putty and gripper   Activity Tolerance Patient tolerated treatment well   Behavior During Therapy [facility] for tasks assessed/performed      Past Medical History  Diagnosis Date  . Stroke [id number]  . Hypertension   . Cancer 45 years ago melanoma  . Enlarged prostate 11/21/14    Past Surgical History  Procedure Laterality Date  . Cholecystectomy N/A   . Joint replacement Bilateral 4-5 years ago    There were no vitals filed for this visit.  Visit Diagnosis:  Lack of coordination due to stroke  Weakness      Subjective Assessment - 12/25/14 1507    Subjective  I can clip my toe nails now.    Fine motor to aid in ADL: Used Purdue peg board all three components and removed alternating fingers for pegs.  Used grooved peg board placing pegs in order and removed alternating fingers. Used color coded clips from hardest to easiest for pinch. Used gripper set on 35, 25, 20, and 10 lbs x 15 times. Used green putty for grip and pinch. Issued green putty.  Completed hand writing today with out assistive devices with 80% legibility (not neat).                          OT Education - 12/25/14 1529    Education provided Yes   Education Details Issued green medium putty and educated patient in home exerceses    Person(s) Educated Patient   Methods Explanation;Demonstration   Comprehension Verbalized  understanding;Returned demonstration             OT Long Term Goals - 11/21/14 1446    OT LONG TERM GOAL #1   Title Will reduce right hand edema to be with 20 mm of left hand as measured the way it was in evaluation in 12 weeks.    Status Partially Met   OT LONG TERM GOAL #2   Title Will improve shoulder flextion and external rotation to resume chopping wood in 12 weeks   Status On-going   OT LONG TERM GOAL #3   Title Will improve grip strength to be able to wipe self after a BM with his right hand in 12 weeks   Status Partially Met   OT LONG TERM GOAL #4   Title Will improve grip in his hand to be able to eat with his right hand in 12 weeks.   Status Partially Met   OT LONG TERM GOAL #5   Title Will impove right hand function to be able to right legibley with his right hand with or with out assitive devices in 12 weeks    Status Partially Met   Long Term Additional Goals   Additional Long Term Goals Yes   OT LONG TERM GOAL #6   Title Will imporve right hand grip  to be able to brush teeth with right hand in 12 weeks   Status Partially Met   OT LONG TERM GOAL #7   Title Patient will improve hand writing to be able to write checks in 12 weeks   Status On-going               Plan - 12/25/14 1530    Clinical Impression Statement Patinet wrote words with 80% legibility (was sloppy but legible   Pt will benefit from skilled therapeutic intervention in order to improve on the following deficits (Retired) Decreased coordination;Decreased strength   Rehab Potential Good   Clinical Impairments Affecting Rehab Potential Coordination interfers with check writing.   OT Treatment/Interventions Self-care/ADL training;Neuromuscular education   OT Home Exercise Plan Putty for grip and pinch.   Consulted and Agree with Plan of Care Patient        Problem List There are no active problems to display for this patient. Sharon Mt, MS/OTR/L   Sharon Mt 12/25/2014, 3:36  PM  [place] MAIN [facility] SERVICES [address] [place], [place], [address] Phone: [phone]   Fax:  [phone]

## 2014-12-26 ENCOUNTER — Other Ambulatory Visit: Payer: Self-pay | Admitting: Urology

## 2014-12-26 DIAGNOSIS — N2889 Other specified disorders of kidney and ureter: Secondary | ICD-10-CM

## 2014-12-27 ENCOUNTER — Ambulatory Visit
Admission: RE | Admit: 2014-12-27 | Discharge: 2014-12-27 | Disposition: A | Discharge status: Home / Self Care | Payer: Medicare Other | Source: Ambulatory Visit | Attending: Urology | Admitting: Urology

## 2014-12-27 DIAGNOSIS — N2889 Other specified disorders of kidney and ureter: Secondary | ICD-10-CM | POA: Insufficient documentation

## 2014-12-27 NOTE — Consult Note (Signed)
Chief Complaint: Chief Complaint  Patient presents with  . Advice Only    Consult for Cryoablation of Left Renal Mass    Referring Physician(s): Brandon,Ashley  History of Present Illness: [name] [name] is a 81 y.o. male with a history of nephrolithiasis and urinary tract infection. CT imaging at [facility] for gross hematuria demonstrated an incidentally found 1.5 cm left upper pole renal mass. This is concerning for a small incidental renal cell carcinoma versus a hemorrhagic or proteinaceous debris filled cyst. He presents to review treatment options and discuss image guided cryoablation. He remains asymptomatic. No current abdominal pain, flank pain, dysuria, hematuria. He is recovering from a recent stroke and doing well with rehabilitation. He is accompanied by his son today.  Past Medical History  Diagnosis Date  . Stroke [id number]  . Hypertension   . Cancer 45 years ago melanoma  . Enlarged prostate 11/21/14    Past Surgical History  Procedure Laterality Date  . Cholecystectomy N/A   . Joint replacement Bilateral 4-5 years ago    Allergies: Review of patient's allergies indicates no known allergies.  Medications: Prior to Admission medications   Medication Sig Start Date End Date Taking? Authorizing Provider  amLODipine (NORVASC) 5 MG tablet Take 5 mg by mouth daily.   Yes Historical Provider, MD  atorvastatin (LIPITOR) 40 MG tablet Take 40 mg by mouth daily.   Yes Historical Provider, MD  beta carotene 25000 UNIT capsule Take 25,000 Units by mouth daily.   Yes Historical Provider, MD  finasteride (PROSCAR) 5 MG tablet Take 5 mg by mouth daily.   Yes Historical Provider, MD  hydrochlorothiazide (HYDRODIURIL) 25 MG tablet Take 25 mg by mouth daily.   Yes Historical Provider, MD  lisinopril (PRINIVIL,ZESTRIL) 40 MG tablet Take 40 mg by mouth daily.   Yes Historical Provider, MD  metoprolol (LOPRESSOR) 50 MG tablet Take 50 mg by mouth 2 (two) times daily.   Yes Historical  Provider, MD  potassium chloride SA (K-DUR,KLOR-CON) 20 MEQ tablet Take 20 mEq by mouth 2 (two) times daily.   Yes Historical Provider, MD  saw palmetto 500 MG capsule Take 500 mg by mouth daily.   Yes Historical Provider, MD  tamsulosin (FLOMAX) 0.4 MG CAPS capsule Take 0.4 mg by mouth.   Yes Historical Provider, MD  zinc gluconate 50 MG tablet Take 50 mg by mouth daily.   Yes Historical Provider, MD  nitrofurantoin (MACRODANTIN) 100 MG capsule Take 100 mg by mouth 2 (two) times daily.    Historical Provider, MD     No family history on file.  History   Social History  . Marital Status: Single    Spouse Name: N/A  . Number of Children: N/A  . Years of Education: N/A   Social History Main Topics  . Smoking status: Former Smoker    Types: Cigarettes    Quit date: 11/21/1974  . Smokeless tobacco: Not on file  . Alcohol Use: Not on file  . Drug Use: Not on file  . Sexual Activity: Not on file   Other Topics Concern  . Not on file   Social History Narrative      Review of Systems: A 12 point ROS discussed and pertinent positives are indicated in the HPI above.  All other systems are negative.  Review of Systems  Constitutional: Negative for fever, diaphoresis, activity change, appetite change and fatigue.  Respiratory: Negative for shortness of breath.   Cardiovascular: Negative for chest pain.  Gastrointestinal: Negative for abdominal distention.  Genitourinary: Negative for dysuria and flank pain.    Vital Signs: BP 130/71 mmHg  Pulse 60  Temp(Src) 98.2 F (36.8 C) (Oral)  Resp 14  Ht '5\' 8"'$  (1.727 m)  Wt 202 lb (91.627 kg)  BMI 30.72 kg/m2  SpO2 96%  Physical Exam  Constitutional: He appears well-developed and well-nourished. No distress.  Moderately obese male in no acute distress.  Cardiovascular: Normal rate and regular rhythm.   No murmur heard. Pulmonary/Chest: Effort normal and breath sounds normal. No respiratory distress. He has no rales. He exhibits  no tenderness.  Abdominal: Soft. Bowel sounds are normal. He exhibits no distension.  Skin: He is not diaphoretic.  Psychiatric: He has a normal mood and affect. His behavior is normal. Judgment and thought content normal.    Imaging: March 2016 CT exam was reviewed. Imaging was reviewed with the patient and his son. This demonstratese a 1.5 cm left upper pole indeterminate renal mass concerning for a small renal cell carcinoma versus a hemorrhagic or proteinaceous cyst.  Labs:  CBC:  Recent Labs  07/09/14 1736 07/10/14 0445  WBC 7.3 7.4  HGB 15.6 14.3  HCT 46.8 42.7  PLT 211 170    COAGS:  Recent Labs  07/09/14 1736  INR 1.0  APTT 27.7    BMP:  Recent Labs  07/09/14 1736 07/10/14 0445 07/11/14 0421  NA 142 141 142  K 3.2* 2.7* 3.0*  CL 106 106 109*  CO2 '29 28 27  '$ GLUCOSE 108* 85 107*  BUN 24* 21* 14  CALCIUM 8.2* 8.0* 8.0*  CREATININE 0.89 0.71 0.74    LIVER FUNCTION TESTS:  Recent Labs  07/09/14 1736  AST 28  ALT 25  ALKPHOS 109  PROT 6.6  ALBUMIN 3.3*    TUMOR MARKERS: No results for input(s): AFPTM, CEA, CA199, CHROMGRNA in the last 8760 hours.  Assessment and Plan:  81 year old male with an incidentally found 1.5 cm left renal upper pole indeterminate mass concerning for small renal cell carcinoma versus a proteinaceous or hemorrhagic cyst. He remains asymptomatic. Recent hematuria and urinary tract infection related to nephrolithiasis has resolved. He is recovering from a recent stroke and doing very well with rehabilitation. Treatment options for the incidental 1.5 cm left renal mass were reviewed. For further characterization, I would recommend abdominal MRI without and with contrast. This will be scheduled in the next few weeks at [facility]. Further recommendations based on MRI scan.  Thank you for this interesting consult.  I greatly enjoyed meeting AUDRA BELLARD and look forward to participating in their  care.  SignedGreggory Keen 12/27/2014, 2:51 PM   I spent a total of  30 Minutes   in face to face in clinical consultation, greater than 50% of which was counseling/coordinating care for this patient with an indeterminant 1.5 cm left renal mass.

## 2014-12-28 ENCOUNTER — Other Ambulatory Visit: Payer: Self-pay | Admitting: Urology

## 2014-12-28 ENCOUNTER — Other Ambulatory Visit (HOSPITAL_COMMUNITY): Payer: Self-pay | Admitting: Interventional Radiology

## 2014-12-28 DIAGNOSIS — N2889 Other specified disorders of kidney and ureter: Secondary | ICD-10-CM

## 2014-12-31 ENCOUNTER — Other Ambulatory Visit: Payer: Self-pay | Admitting: *Deleted

## 2014-12-31 DIAGNOSIS — N2889 Other specified disorders of kidney and ureter: Secondary | ICD-10-CM

## 2015-01-01 ENCOUNTER — Encounter: Payer: Self-pay | Admitting: Occupational Therapy

## 2015-01-01 ENCOUNTER — Ambulatory Visit: Payer: Medicare Other | Admitting: Occupational Therapy

## 2015-01-01 DIAGNOSIS — R531 Weakness: Secondary | ICD-10-CM

## 2015-01-01 DIAGNOSIS — IMO0002 Reserved for concepts with insufficient information to code with codable children: Secondary | ICD-10-CM

## 2015-01-01 DIAGNOSIS — I629 Nontraumatic intracranial hemorrhage, unspecified: Secondary | ICD-10-CM | POA: Diagnosis not present

## 2015-01-01 NOTE — Therapy (Signed)
[place] MAIN [facility] SERVICES [address] [place], [place], [address] Phone: [phone]   Fax:  [phone]  Occupational Therapy Treatment  Patient Details  Name: [name] [name] MRN: [id number] Date of Birth: 1932-06-15 Referring Provider:  No ref. provider found  Encounter Date: 01/01/2015      OT End of Session - 01/01/15 1546    OT Start Time 1430   OT Stop Time 1515   OT Time Calculation (min) 45 min      Past Medical History  Diagnosis Date  . Stroke [id number]  . Hypertension   . Cancer 45 years ago melanoma  . Enlarged prostate 11/21/14    Past Surgical History  Procedure Laterality Date  . Cholecystectomy N/A   . Joint replacement Bilateral 4-5 years ago    There were no vitals filed for this visit.  Visit Diagnosis:  Lack of coordination due to stroke  Weakness      Subjective Assessment - 01/01/15 1505    Subjective  I still wipe with my left hand but eat all the time with my left   Currently in Pain? No/denies    Fine motor to aid in ADL: Picked up coins largest to smallest and placed in coin counter Completed card flipping with fingers only Used grooved peg board placing pegs in order and removed alternating fingers. Twisty grip on 5 settings from hardest to easiest x 25, gripper set on 35, 25, 20, and 10 lbs, x 25 reps.                               OT Long Term Goals - 11/21/14 1446    OT LONG TERM GOAL #1   Title Will reduce right hand edema to be with 20 mm of left hand as measured the way it was in evaluation in 12 weeks.    Status Partially Met   OT LONG TERM GOAL #2   Title Will improve shoulder flextion and external rotation to resume chopping wood in 12 weeks   Status On-going   OT LONG TERM GOAL #3   Title Will improve grip strength to be able to wipe self after a BM with his right hand in 12 weeks   Status Partially Met   OT LONG TERM GOAL #4   Title Will improve grip in  his hand to be able to eat with his right hand in 12 weeks.   Status Partially Met   OT LONG TERM GOAL #5   Title Will impove right hand function to be able to right legibley with his right hand with or with out assitive devices in 12 weeks    Status Partially Met   Long Term Additional Goals   Additional Long Term Goals Yes   OT LONG TERM GOAL #6   Title Will imporve right hand grip to be able to brush teeth with right hand in 12 weeks   Status Partially Met   OT LONG TERM GOAL #7   Title Patient will improve hand writing to be able to write checks in 12 weeks   Status On-going               Plan - 01/01/15 1544    Pt will benefit from skilled therapeutic intervention in order to improve on the following deficits (Retired) Decreased coordination;Decreased strength   Rehab Potential Good   Clinical Impairments Affecting Rehab Potential Coordination  interfers with check writing.   OT Treatment/Interventions Self-care/ADL training;Neuromuscular education   OT Home Exercise Plan Putty for grip and pinch.        Problem List Patient Active Problem List   Diagnosis Date Noted  . Renal mass, left   Sharon Mt, MS/OTR/L   Sharon Mt 01/01/2015, 3:48 PM  [place] MAIN [facility] SERVICES [address] [place], [place], [address] Phone: [phone]   Fax:  [phone]

## 2015-01-08 ENCOUNTER — Encounter: Payer: Self-pay | Admitting: Occupational Therapy

## 2015-01-08 ENCOUNTER — Ambulatory Visit: Payer: Medicare Other | Admitting: Occupational Therapy

## 2015-01-08 DIAGNOSIS — I629 Nontraumatic intracranial hemorrhage, unspecified: Secondary | ICD-10-CM | POA: Diagnosis not present

## 2015-01-08 DIAGNOSIS — R531 Weakness: Secondary | ICD-10-CM

## 2015-01-08 DIAGNOSIS — IMO0002 Reserved for concepts with insufficient information to code with codable children: Secondary | ICD-10-CM

## 2015-01-08 NOTE — Therapy (Signed)
[place] MAIN [facility] SERVICES [address] [place], [place], [address] Phone: [phone]   Fax:  [phone]  Occupational Therapy Treatment/Discharge summary  Patient Details  Name: [name] [name] MRN: [id number] Date of Birth: 22-May-1932 Referring Provider:  No ref. provider found  Encounter Date: 2015/01/13      OT End of Session - 01/13/15 1520    Activity Tolerance Patient tolerated treatment well   Behavior During Therapy [facility] for tasks assessed/performed      Past Medical History  Diagnosis Date  . Stroke [id number]  . Hypertension   . Cancer 45 years ago melanoma  . Enlarged prostate 11/21/14    Past Surgical History  Procedure Laterality Date  . Cholecystectomy N/A   . Joint replacement Bilateral 4-5 years ago    There were no vitals filed for this visit.  Visit Diagnosis:  Lack of coordination due to stroke  Weakness      Subjective Assessment - Jan 13, 2015 1413    Subjective  They are going to do an MRI    Currently in Pain? No/denies    Patient has achieved 7 of 8 goals and made progress in the remaining goal. Patient has improved with 9 hole peg test from 60 seconds to 40 seconds. Patient's edema has reduced so he can now close his hand all the way. Patient has not chopped wood (it is summer) but feels he now could chop wood. Patient still is unable to wipe self after BM with right hand (still uses left). Patient grip has improved to 24 lbs from 10 lbs and is now able to eat with his right hand with no assistive devices. Patient can now write with 100% legibility (not pretty at all) and well enough to write checks (son does still assist). He now brushes teeth with right hand with out assistive devices. He no longer needs assist to get in and out of the shower.                               OT Long Term Goals - 01-13-15 1437    OT LONG TERM GOAL #1   Status Achieved   OT LONG TERM GOAL #5   Status  Achieved               Plan - Jan 13, 2015 1521    Clinical Impression Statement Patient has done well and achieved 7 of 8 goals total and made progress on the remaining goal. No further Occupational Therapy needed at this time.   OT Treatment/Interventions Self-care/ADL training;Neuromuscular education   OT Home Exercise Plan Putty for grip and pinch.   Consulted and Agree with Plan of Care Patient  For discharge          G-Codes - January 13, 2015 1446    Self Care Goal Status [phone]) At least 1 percent but less than 20 percent impaired, limited or restricted      Problem List Patient Active Problem List   Diagnosis Date Noted  . Renal mass, left      Myrene Galas, MS/OTR/L  January 13, 2015, 3:30 PM  [place] MAIN [facility] SERVICES [address] [place], [place], [address] Phone: [phone]   Fax:  [phone]

## 2015-01-10 ENCOUNTER — Ambulatory Visit
Admission: RE | Admit: 2015-01-10 | Discharge: 2015-01-10 | Disposition: A | Discharge status: Home / Self Care | Payer: Medicare Other | Source: Ambulatory Visit | Attending: Interventional Radiology | Admitting: Interventional Radiology

## 2015-01-10 DIAGNOSIS — N289 Disorder of kidney and ureter, unspecified: Secondary | ICD-10-CM | POA: Diagnosis not present

## 2015-01-10 DIAGNOSIS — N2889 Other specified disorders of kidney and ureter: Secondary | ICD-10-CM | POA: Diagnosis present

## 2015-01-10 MED ORDER — GADOBENATE DIMEGLUMINE 529 MG/ML IV SOLN
20.0000 mL | Freq: Once | INTRAVENOUS | Status: AC | PRN
Start: 2015-01-10 — End: 2015-01-10
  Administered 2015-01-10: 20 mL via INTRAVENOUS

## 2015-01-22 ENCOUNTER — Other Ambulatory Visit (HOSPITAL_COMMUNITY): Payer: Self-pay | Admitting: Interventional Radiology

## 2015-01-22 DIAGNOSIS — N2889 Other specified disorders of kidney and ureter: Secondary | ICD-10-CM

## 2015-01-25 ENCOUNTER — Encounter: Payer: Self-pay | Admitting: Urology

## 2015-01-25 ENCOUNTER — Ambulatory Visit (INDEPENDENT_AMBULATORY_CARE_PROVIDER_SITE_OTHER): Payer: Self-pay | Admitting: Urology

## 2015-01-25 VITALS — BP 153/89 | HR 67 | Ht 68.0 in | Wt 215.2 lb

## 2015-01-25 DIAGNOSIS — N138 Other obstructive and reflux uropathy: Secondary | ICD-10-CM

## 2015-01-25 DIAGNOSIS — N2889 Other specified disorders of kidney and ureter: Secondary | ICD-10-CM

## 2015-01-25 DIAGNOSIS — R351 Nocturia: Secondary | ICD-10-CM

## 2015-01-25 DIAGNOSIS — N401 Enlarged prostate with lower urinary tract symptoms: Secondary | ICD-10-CM

## 2015-01-25 NOTE — Progress Notes (Signed)
01/25/2015 5:34 PM   [name] [name] 1931-07-30 [id number]  Referring provider: Dion Body, MD [place], [place] [address]  Chief Complaint  Patient presents with  . Follow-up    TRUS.   I do think it is reasonable considering outlet procedure briefly discuss his options including open prostatectomy, TURP, laser ablation of the prostate, laser enucleation of the prostate. The pt wasstarted on Finisteride to help shrink the prostate   . diagnostic procedure    The pt had a MRI and was diagnose w/ Renal cancer    HPI: 81 year old male with gross hematuria s/p work up hematuria workup with cystoscopy 11/2014 and CT Urogram, BPH with LUTS, incidental 1.4 cm left enhancing upper pole renal mass and history of nephrolithiasis.  He has been seen and evaluated by IR with plan for percutaneous intervention for his renal mass scheduled for later this month.     He currently takes Flomax and has for many years and was started on Finasteride at last visit. Since starting finasteride, he is doing much better in terms of his urinary symptoms.  He does report significant urinary symptoms primarily urinary urgency, frequency, and episodes of urge incontinence. He also has severe nocturia which has improved to x4-5 more recently.  He does report a weak urinary stream but does feel as though he is able to empty his bladder completely.  He does not drink much water (6 oz) after 5 pm.     He does have a history of urinary retention many years ago following bile duct surgery requiring Foley x 3 months.    Cysto 11/2014 showed trabeculated bladder with significantly enlarged prostate, median lobe.  He presents today for follow up and TRUS volume for possible consideration of outlet procedure.    Patient presents with his son.    PMH: Past Medical History  Diagnosis Date  . Stroke [id number]  . Hypertension   . Cancer 45 years ago melanoma  . Enlarged prostate 11/21/14  . Renal  cancer     Surgical History: Past Surgical History  Procedure Laterality Date  . Cholecystectomy N/A   . Joint replacement Bilateral 4-5 years ago    Home Medications:    Medication List       This list is accurate as of: 01/25/15  5:34 PM.  Always use your most recent med list.               amLODipine 5 MG tablet  Commonly known as:  NORVASC  Take 5 mg by mouth daily.     atorvastatin 40 MG tablet  Commonly known as:  LIPITOR  Take 40 mg by mouth daily.     beta carotene 25000 UNIT capsule  Take 25,000 Units by mouth daily.     finasteride 5 MG tablet  Commonly known as:  PROSCAR  Take 5 mg by mouth daily.     hydrochlorothiazide 25 MG tablet  Commonly known as:  HYDRODIURIL  Take 25 mg by mouth daily.     lisinopril 40 MG tablet  Commonly known as:  PRINIVIL,ZESTRIL  Take 40 mg by mouth daily.     metoprolol 50 MG tablet  Commonly known as:  LOPRESSOR  Take 50 mg by mouth 2 (two) times daily.     nitrofurantoin 100 MG capsule  Commonly known as:  MACRODANTIN  Take 100 mg by mouth 2 (two) times daily.     potassium chloride SA 20 MEQ tablet  Commonly known  as:  K-DUR,KLOR-CON  Take 20 mEq by mouth 2 (two) times daily.     saw palmetto 500 MG capsule  Take 500 mg by mouth daily.     tamsulosin 0.4 MG Caps capsule  Commonly known as:  FLOMAX  Take 0.4 mg by mouth.     zinc gluconate 50 MG tablet  Take 50 mg by mouth daily.        Allergies: No Known Allergies  Family History: Family History  Problem Relation Age of Onset  . Cancer Brother   . Cancer Sister   . Cancer Sister   . Cancer - Prostate Brother     Social History:  reports that he quit smoking about 40 years ago. His smoking use included Cigarettes. He does not have any smokeless tobacco history on file. He reports that he does not drink alcohol or use illicit drugs.   Physical Exam: BP 153/89 mmHg  Pulse 67  Ht '5\' 8"'$  (1.727 m)  Wt 215 lb 3.2 oz (97.614 kg)  BMI 32.73  kg/m2  Constitutional:  Alert and oriented, No acute distress. HEENT: [place] AT, moist mucus membranes.  Trachea midline, no masses. Cardiovascular: No clubbing, cyanosis, or edema. Respiratory: Normal respiratory effort, no increased work of breathing. GI: Abdomen is soft, nontender, nondistended, no abdominal masses GU: No CVA tenderness.  Rectal: Normal external sphincter.   Skin: No rashes, bruises or suspicious lesions. Psychiatric: Normal mood and affect.  Laboratory Data: Lab Results  Component Value Date   WBC 7.4 07/10/2014   HGB 14.3 07/10/2014   HCT 42.7 07/10/2014   MCV 85 07/10/2014   PLT 170 07/10/2014    Lab Results  Component Value Date   CREATININE 0.74 07/11/2014    Pertinent Imaging: CT Urogram 10/08/14 IMPRESSION: 1. 3 mm calculus at the right ureteropelvic junction. This does not appear to be obstructive at this time, as there is no proximal hydronephrosis. 2. Multiple bladder calculi, largest of which measures up to 15 mm. 3. In addition, in the upper pole of the left kidney there is a 1.4 cm enhancing neoplasm, concerning for small renal cell carcinoma. Urologic consultation is recommended. 4. Severe median lobe hypertrophy in the prostate gland. This is associated with multiple bladder wall diverticulae, which could be the result of chronic bladder outlet obstruction. 5. Atherosclerosis, including at least 2 vessel coronary artery disease. 6. Small hiatal hernia. 7. Additional incidental findings, as above.   MRI 01/10/15  CLINICAL DATA: Follow-up left renal lesion on CT  EXAM: MRI ABDOMEN WITHOUT AND WITH CONTRAST  TECHNIQUE: Multiplanar multisequence MR imaging of the abdomen was performed both before and after the administration of intravenous contrast.  CONTRAST: 80m MULTIHANCE GADOBENATE DIMEGLUMINE 529 MG/ML IV SOLN  COMPARISON: CT abdomen pelvis dated 10/08/2014  FINDINGS: Lower chest: Lung bases are  clear.  Hepatobiliary: Scattered hepatic cysts, including a 2.3 cm mildly irregular cyst in the central liver (series 4/image 8), benign. No hepatic steatosis.  Status post cholecystectomy. No intrahepatic or extrahepatic ductal dilatation.  Pancreas: Within normal limits.  Spleen: 12 mm enhancing T2 hyperintense lesion in the lateral spleen (series 4/ image 9), possibly reflecting a splenic hemangioma.  Adrenals/Urinary Tract: Adrenal glands are within normal limits.  12 mm enhancing lesion in the anterior left upper kidney (series 12/ image 48), compatible with solid renal neoplasm.  14 mm lesion with layering hemorrhage in the posterior left lower kidney (series 10/ image 84), without definite enhancement, compatible with a benign hemorrhagic cyst (Bosniak II).  Additional scattered subcentimeter simple cysts bilaterally. No hydronephrosis.  Stomach/Bowel: Stomach is notable for a small hiatal hernia.  Visualized bowel is notable for mild hyperenhancement along the terminal ileum (series 16/image 90), nonspecific.  Vascular/Lymphatic: No evidence of abdominal aortic aneurysm.  Single left renal artery and vein. No renal vein invasion.  No suspicious abdominopelvic lymphadenopathy.  Other: No abdominal ascites.  Musculoskeletal: No focal osseous lesions.  IMPRESSION: 12 mm enhancing lesion in the anterior left upper kidney, compatible with solid renal neoplasm.  Single left renal artery and vein. No renal vein invasion.  No evidence of metastatic disease.  Additional ancillary findings as above.   Electronically Signed  By: Julian Hy M.D.  On: 01/10/2015 16:53   Prostate TRUS  Informed consent was obtained after discussing risks/benefits of the procedure.  A time out was performed to ensure correct patient identity.  Procedure: -Transrectal Ultrasound performed revealing a 76 gm prostate -Moderate median lobe  procedure -Multiple calcifications noted throughout procedure  Post-Procedure: - Patient tolerated the procedure well  Assessment & Plan:  81 yo M with BPH, left and nocturia as well as an incidental 1.4 cm left enhancing renal mass.  1. BPH with obstruction/lower urinary tract symptoms BPH with symptoms improving on Flomax and recently started finasteride. TRUS volume today 76 cc.  Since doing better, we'll defer surgical management at this time but considered down the road if his symptoms worsen. Plan to reassess in 6 months. -continue Flomax/ finasteride  2. Nocturia Discussed behavioral modification.  Symptoms improving.   3. Left renal mass Plan for upcoming percutaneous intervention by interventional radiology.   Return in about 6 months (around 07/28/2015) for IPSS.  Hollice Espy, MD  [facility] [address], [place], [place] [address] [phone]

## 2015-02-07 ENCOUNTER — Ambulatory Visit
Admission: RE | Admit: 2015-02-07 | Discharge: 2015-02-07 | Disposition: A | Discharge status: Home / Self Care | Payer: Medicare Other | Source: Ambulatory Visit | Attending: Interventional Radiology | Admitting: Interventional Radiology

## 2015-02-07 DIAGNOSIS — N2889 Other specified disorders of kidney and ureter: Secondary | ICD-10-CM | POA: Insufficient documentation

## 2015-02-07 NOTE — Progress Notes (Signed)
Patient ID: [name] [name], male   DOB: 18-Sep-1931, 81 y.o.   MRN: [id number]    Chief Complaint: Patient was seen in consultation today for  Chief Complaint  Patient presents with  . Follow-up    review MR result for possible Left Renal Cryoablation     l  Referring Physician(s): Hollice Espy  History of Present Illness: [name] [name] is a 81 y.o. male with an incidentally found left upper pole renal mass by CT. For further evaluation, MRI was performed. This confirmed a 1.2 cm solid enhancing left upper pole renal mass compatible with a small asymptomatic renal cell carcinoma. No current flank pain, abdominal pain, urinary tract infection or hematuria. He continues to recover from his recent stroke and is doing very well. He returns to review the MRI scan and treatment options today.  Past Medical History  Diagnosis Date  . Stroke [id number]  . Hypertension   . Cancer 45 years ago melanoma  . Enlarged prostate 11/21/14  . Renal cancer     Past Surgical History  Procedure Laterality Date  . Cholecystectomy N/A   . Joint replacement Bilateral 4-5 years ago    Allergies: Review of patient's allergies indicates no known allergies.  Medications: Prior to Admission medications   Medication Sig Start Date End Date Taking? Authorizing Provider  amLODipine (NORVASC) 5 MG tablet Take 5 mg by mouth daily.   Yes Historical Provider, MD  aspirin 81 MG tablet Take 81 mg by mouth daily.   Yes Historical Provider, MD  atorvastatin (LIPITOR) 40 MG tablet Take 40 mg by mouth daily.   Yes Historical Provider, MD  beta carotene 25000 UNIT capsule Take 25,000 Units by mouth daily.   Yes Historical Provider, MD  finasteride (PROSCAR) 5 MG tablet Take 5 mg by mouth daily.   Yes Historical Provider, MD  hydrochlorothiazide (HYDRODIURIL) 25 MG tablet Take 25 mg by mouth daily.   Yes Historical Provider, MD  lisinopril (PRINIVIL,ZESTRIL) 40 MG tablet Take 40 mg by mouth daily.   Yes Historical Provider,  MD  metoprolol (LOPRESSOR) 50 MG tablet Take 50 mg by mouth 2 (two) times daily.   Yes Historical Provider, MD  potassium chloride SA (K-DUR,KLOR-CON) 20 MEQ tablet Take 20 mEq by mouth 2 (two) times daily.   Yes Historical Provider, MD  saw palmetto 500 MG capsule Take 500 mg by mouth daily.   Yes Historical Provider, MD  tamsulosin (FLOMAX) 0.4 MG CAPS capsule Take 0.4 mg by mouth.   Yes Historical Provider, MD  zinc gluconate 50 MG tablet Take 50 mg by mouth daily.   Yes Historical Provider, MD  nitrofurantoin (MACRODANTIN) 100 MG capsule Take 100 mg by mouth 2 (two) times daily.    Historical Provider, MD     Family History  Problem Relation Age of Onset  . Cancer Brother   . Cancer Sister   . Cancer Sister   . Cancer - Prostate Brother     History   Social History  . Marital Status: Single    Spouse Name: N/A  . Number of Children: N/A  . Years of Education: N/A   Social History Main Topics  . Smoking status: Former Smoker    Types: Cigarettes    Quit date: 11/21/1974  . Smokeless tobacco: Not on file  . Alcohol Use: No  . Drug Use: No  . Sexual Activity: Not on file   Other Topics Concern  . Not on file   Social History Narrative  Review of Systems: A 12 point ROS discussed and pertinent positives are indicated in the HPI above.  All other systems are negative.  Review of Systems  Vital Signs: BP 152/81 mmHg  Pulse 65  Temp(Src) 98.2 F (36.8 C) (Oral)  Resp 15  SpO2 96%  Physical Exam  Constitutional: He appears well-developed and well-nourished. No distress.  Cardiovascular: Normal rate and regular rhythm.   No murmur heard. Pulmonary/Chest: Effort normal and breath sounds normal.  Abdominal: Soft. Bowel sounds are normal. He exhibits no distension.  Skin: Skin is warm and dry. He is not diaphoretic.    Imaging: Mr Abdomen W Wo Contrast  01/10/2015   CLINICAL DATA:  Follow-up left renal lesion on CT  EXAM: MRI ABDOMEN WITHOUT AND WITH CONTRAST   TECHNIQUE: Multiplanar multisequence MR imaging of the abdomen was performed both before and after the administration of intravenous contrast.  CONTRAST:  22m MULTIHANCE GADOBENATE DIMEGLUMINE 529 MG/ML IV SOLN  COMPARISON:  CT abdomen pelvis dated 10/08/2014  FINDINGS: Lower chest:  Lung bases are clear.  Hepatobiliary: Scattered hepatic cysts, including a 2.3 cm mildly irregular cyst in the central liver (series 4/image 8), benign. No hepatic steatosis.  Status post cholecystectomy. No intrahepatic or extrahepatic ductal dilatation.  Pancreas: Within normal limits.  Spleen: 12 mm enhancing T2 hyperintense lesion in the lateral spleen (series 4/ image 9), possibly reflecting a splenic hemangioma.  Adrenals/Urinary Tract: Adrenal glands are within normal limits.  12 mm enhancing lesion in the anterior left upper kidney (series 12/ image 48), compatible with solid renal neoplasm.  14 mm lesion with layering hemorrhage in the posterior left lower kidney (series 10/ image 84), without definite enhancement, compatible with a benign hemorrhagic cyst (Bosniak II).  Additional scattered subcentimeter simple cysts bilaterally. No hydronephrosis.  Stomach/Bowel: Stomach is notable for a small hiatal hernia.  Visualized bowel is notable for mild hyperenhancement along the terminal ileum (series 16/image 90), nonspecific.  Vascular/Lymphatic: No evidence of abdominal aortic aneurysm.  Single left renal artery and vein.  No renal vein invasion.  No suspicious abdominopelvic lymphadenopathy.  Other: No abdominal ascites.  Musculoskeletal: No focal osseous lesions.  IMPRESSION: 12 mm enhancing lesion in the anterior left upper kidney, compatible with solid renal neoplasm.  Single left renal artery and vein.  No renal vein invasion.  No evidence of metastatic disease.  Additional ancillary findings as above.   Electronically Signed   By: SJulian HyM.D.   On: 01/10/2015 16:53    Labs:  CBC:  Recent Labs   07/09/14 1736 07/10/14 0445  WBC 7.3 7.4  HGB 15.6 14.3  HCT 46.8 42.7  PLT 211 170    COAGS:  Recent Labs  07/09/14 1736  INR 1.0  APTT 27.7    BMP:  Recent Labs  07/09/14 1736 07/10/14 0445 07/11/14 0421  NA 142 141 142  K 3.2* 2.7* 3.0*  CL 106 106 109*  CO2 '29 28 27  '$ GLUCOSE 108* 85 107*  BUN 24* 21* 14  CALCIUM 8.2* 8.0* 8.0*  CREATININE 0.89 0.71 0.74    LIVER FUNCTION TESTS:  Recent Labs  07/09/14 1736  BILITOT 0.4  AST 28  ALT 25  ALKPHOS 109  PROT 6.6  ALBUMIN 3.3*    Assessment and Plan:  81year old male with a history of nephrolithiasis, urinary tract infections, prostate enlargement. CT imaging and MRI imaging confirms a 1.2 cm left upper pole enhancing solid renal mass compatible with a small renal cell carcinoma. MRI imaging  confirms this. Patient remains asymptomatic. Treatment options were reviewed again with the patient including cryoablation, surveillance, and partial nephrectomy. After discussion, he would like to proceed with image guided cryoablation. The procedure, risks, benefits and alternatives were reviewed again. All questions were addressed. He was accompanied by his son today.  Plan: CT-guided left renal cell carcinoma cryoablation will be scheduled in the next few weeks at [facility].  Thank you for this interesting consult.  I greatly enjoyed meeting [name] [name] and look forward to participating in their care.  A copy of this report was sent to the requesting provider on this date.  SignedGreggory Keen 02/07/2015, 4:12 PM   I spent a total of    15 Minutes in face to face in clinical consultation, greater than 50% of which was counseling/coordinating care for with a small 1.2 cm left renal cell carcinoma.

## 2015-02-15 ENCOUNTER — Other Ambulatory Visit: Payer: Self-pay | Admitting: Interventional Radiology

## 2015-02-15 DIAGNOSIS — C642 Malignant neoplasm of left kidney, except renal pelvis: Secondary | ICD-10-CM

## 2015-03-08 ENCOUNTER — Other Ambulatory Visit: Payer: Self-pay | Admitting: Radiology

## 2015-03-11 NOTE — Patient Instructions (Addendum)
YOUR PROCEDURE IS SCHEDULED ON : 03/15/15  REPORT TO [facility] MAIN ENTRANCE FOLLOW SIGNS TO EAST ELEVATOR - GO TO 3rd FLOOR CHECK IN AT 3 EAST NURSES STATION (SHORT STAY) AT:  6:30 AM  CALL THIS NUMBER IF YOU HAVE PROBLEMS THE MORNING OF SURGERY [phone]  REMEMBER:ONLY 1 PER PERSON MAY GO TO SHORT STAY WITH YOU TO GET READY THE MORNING OF YOUR SURGERY  DO NOT EAT FOOD OR DRINK LIQUIDS AFTER MIDNIGHT  TAKE THESE MEDICINES THE MORNING OF SURGERY: AMLODIPINE / LIPITOR / METOPROLOL  YOU MAY NOT HAVE ANY METAL ON YOUR BODY INCLUDING HAIR PINS AND PIERCING'S. DO NOT WEAR JEWELRY, MAKEUP, LOTIONS, POWDERS OR PERFUMES. DO NOT WEAR NAIL POLISH. DO NOT SHAVE 48 HRS PRIOR TO SURGERY. MEN MAY SHAVE FACE AND NECK.  DO NOT [place]. [facility] IS NOT RESPONSIBLE FOR VALUABLES.  CONTACTS, DENTURES OR PARTIALS MAY NOT BE WORN TO SURGERY. LEAVE SUITCASE IN CAR. CAN BE BROUGHT TO ROOM AFTER SURGERY.  PATIENTS DISCHARGED THE DAY OF SURGERY WILL NOT BE ALLOWED TO DRIVE HOME.  PLEASE READ OVER THE FOLLOWING INSTRUCTION SHEETS _________________________________________________________________________________                                          [place] - PREPARING FOR SURGERY  Before surgery, you can play an important role.  Because skin is not sterile, your skin needs to be as free of germs as possible.  You can reduce the number of germs on your skin by washing with CHG (chlorahexidine gluconate) soap before surgery.  CHG is an antiseptic cleaner which kills germs and bonds with the skin to continue killing germs even after washing. Please DO NOT use if you have an allergy to CHG or antibacterial soaps.  If your skin becomes reddened/irritated stop using the CHG and inform your nurse when you arrive at Short Stay. Do not shave (including legs and underarms) for at least 48 hours prior to the first CHG shower.  You may shave your face. Please follow  these instructions carefully:   1.  Shower with CHG Soap the night before surgery and the  morning of Surgery.   2.  If you choose to wash your hair, wash your hair first as usual with your  normal  Shampoo.   3.  After you shampoo, rinse your hair and body thoroughly to remove the  shampoo.                                         4.  Use CHG as you would any other liquid soap.  You can apply chg directly  to the skin and wash . Gently wash with scrungie or clean wascloth    5.  Apply the CHG Soap to your body ONLY FROM THE NECK DOWN.   Do not use on open                           Wound or open sores. Avoid contact with eyes, ears mouth and genitals (private parts).                        Genitals (private parts) with your normal  soap.              6.  Wash thoroughly, paying special attention to the area where your surgery  will be performed.   7.  Thoroughly rinse your body with warm water from the neck down.   8.  DO NOT shower/wash with your normal soap after using and rinsing off  the CHG Soap .                9.  Pat yourself dry with a clean towel.             10.  Wear clean night clothes to bed after shower             11.  Place clean sheets on your bed the night of your first shower and do not  sleep with pets.  Day of Surgery : Do not apply any lotions/deodorants the morning of surgery.  Please wear clean clothes to the hospital/surgery center.  FAILURE TO FOLLOW THESE INSTRUCTIONS MAY RESULT IN THE CANCELLATION OF YOUR SURGERY    PATIENT SIGNATURE_________________________________  ______________________________________________________________________

## 2015-03-11 NOTE — Progress Notes (Signed)
07/09/2014-EKG noted in EPIC.

## 2015-03-12 ENCOUNTER — Encounter (HOSPITAL_COMMUNITY)
Admission: RE | Admit: 2015-03-12 | Discharge: 2015-03-12 | Disposition: A | Discharge status: Home / Self Care | Payer: Medicare Other | Source: Ambulatory Visit | Attending: Interventional Radiology | Admitting: Interventional Radiology

## 2015-03-12 ENCOUNTER — Encounter (HOSPITAL_COMMUNITY): Payer: Self-pay

## 2015-03-12 DIAGNOSIS — Z87442 Personal history of urinary calculi: Secondary | ICD-10-CM | POA: Diagnosis not present

## 2015-03-12 DIAGNOSIS — Z8673 Personal history of transient ischemic attack (TIA), and cerebral infarction without residual deficits: Secondary | ICD-10-CM | POA: Diagnosis not present

## 2015-03-12 DIAGNOSIS — N400 Enlarged prostate without lower urinary tract symptoms: Secondary | ICD-10-CM | POA: Diagnosis not present

## 2015-03-12 DIAGNOSIS — C642 Malignant neoplasm of left kidney, except renal pelvis: Secondary | ICD-10-CM | POA: Diagnosis present

## 2015-03-12 DIAGNOSIS — Z79899 Other long term (current) drug therapy: Secondary | ICD-10-CM | POA: Diagnosis not present

## 2015-03-12 DIAGNOSIS — Z87891 Personal history of nicotine dependence: Secondary | ICD-10-CM | POA: Diagnosis not present

## 2015-03-12 DIAGNOSIS — I10 Essential (primary) hypertension: Secondary | ICD-10-CM | POA: Diagnosis not present

## 2015-03-12 DIAGNOSIS — K219 Gastro-esophageal reflux disease without esophagitis: Secondary | ICD-10-CM | POA: Diagnosis not present

## 2015-03-12 DIAGNOSIS — Z7982 Long term (current) use of aspirin: Secondary | ICD-10-CM | POA: Diagnosis not present

## 2015-03-12 HISTORY — DX: Personal history of urinary calculi: Z87.442

## 2015-03-12 HISTORY — DX: Unspecified osteoarthritis, unspecified site: M19.90

## 2015-03-12 HISTORY — DX: Gastro-esophageal reflux disease without esophagitis: K21.9

## 2015-03-12 LAB — CBC
HCT: 41.6 % (ref 39.0–52.0)
Hemoglobin: 13.5 g/dL (ref 13.0–17.0)
MCH: 27.4 pg (ref 26.0–34.0)
MCHC: 32.5 g/dL (ref 30.0–36.0)
MCV: 84.6 fL (ref 78.0–100.0)
Platelets: 196 10*3/uL (ref 150–400)
RBC: 4.92 MIL/uL (ref 4.22–5.81)
RDW: 13.1 % (ref 11.5–15.5)
WBC: 9 10*3/uL (ref 4.0–10.5)

## 2015-03-12 LAB — BASIC METABOLIC PANEL
Anion gap: 8 (ref 5–15)
BUN: 19 mg/dL (ref 6–20)
CALCIUM: 8.8 mg/dL — AB (ref 8.9–10.3)
CO2: 31 mmol/L (ref 22–32)
Chloride: 102 mmol/L (ref 101–111)
Creatinine, Ser: 0.87 mg/dL (ref 0.61–1.24)
GFR calc non Af Amer: >60 mL/min (ref >60)
Glucose, Bld: 113 mg/dL — ABNORMAL HIGH (ref 65–99)
Potassium: 3.1 mmol/L — ABNORMAL LOW (ref 3.5–5.1)
SODIUM: 141 mmol/L (ref 135–145)

## 2015-03-12 LAB — APTT: aPTT: 31 seconds (ref 24–37)

## 2015-03-12 LAB — PROTIME-INR
INR: 1.03 (ref 0.00–1.49)
PROTHROMBIN TIME: 13.7 s (ref 11.6–15.2)

## 2015-03-12 NOTE — Progress Notes (Signed)
Abnormal BMET faxed to [place]

## 2015-03-14 ENCOUNTER — Other Ambulatory Visit: Payer: Self-pay | Admitting: Radiology

## 2015-03-15 ENCOUNTER — Observation Stay (HOSPITAL_COMMUNITY)
Admission: RE | Admit: 2015-03-15 | Discharge: 2015-03-16 | Disposition: A | Discharge status: Home / Self Care | Payer: Medicare Other | Source: Ambulatory Visit | Attending: Interventional Radiology | Admitting: Interventional Radiology

## 2015-03-15 ENCOUNTER — Encounter (HOSPITAL_COMMUNITY): Payer: Self-pay

## 2015-03-15 ENCOUNTER — Encounter (HOSPITAL_COMMUNITY)
Admission: RE | Disposition: A | Discharge status: Home / Self Care | Payer: Self-pay | Source: Ambulatory Visit | Attending: Interventional Radiology

## 2015-03-15 ENCOUNTER — Ambulatory Visit (HOSPITAL_COMMUNITY)
Admission: RE | Admit: 2015-03-15 | Discharge: 2015-03-15 | Disposition: A | Discharge status: Home / Self Care | Payer: Medicare Other | Source: Ambulatory Visit | Attending: Interventional Radiology | Admitting: Interventional Radiology

## 2015-03-15 ENCOUNTER — Ambulatory Visit (HOSPITAL_COMMUNITY): Payer: Medicare Other | Admitting: Anesthesiology

## 2015-03-15 DIAGNOSIS — Z87891 Personal history of nicotine dependence: Secondary | ICD-10-CM | POA: Insufficient documentation

## 2015-03-15 DIAGNOSIS — Z8673 Personal history of transient ischemic attack (TIA), and cerebral infarction without residual deficits: Secondary | ICD-10-CM | POA: Insufficient documentation

## 2015-03-15 DIAGNOSIS — I10 Essential (primary) hypertension: Secondary | ICD-10-CM | POA: Diagnosis not present

## 2015-03-15 DIAGNOSIS — C642 Malignant neoplasm of left kidney, except renal pelvis: Secondary | ICD-10-CM | POA: Diagnosis not present

## 2015-03-15 DIAGNOSIS — C649 Malignant neoplasm of unspecified kidney, except renal pelvis: Secondary | ICD-10-CM | POA: Diagnosis present

## 2015-03-15 DIAGNOSIS — N400 Enlarged prostate without lower urinary tract symptoms: Secondary | ICD-10-CM | POA: Insufficient documentation

## 2015-03-15 DIAGNOSIS — Z79899 Other long term (current) drug therapy: Secondary | ICD-10-CM | POA: Insufficient documentation

## 2015-03-15 DIAGNOSIS — Z87442 Personal history of urinary calculi: Secondary | ICD-10-CM | POA: Insufficient documentation

## 2015-03-15 DIAGNOSIS — Z7982 Long term (current) use of aspirin: Secondary | ICD-10-CM | POA: Insufficient documentation

## 2015-03-15 DIAGNOSIS — K219 Gastro-esophageal reflux disease without esophagitis: Secondary | ICD-10-CM | POA: Insufficient documentation

## 2015-03-15 LAB — TYPE AND SCREEN
ABO/RH(D): AB POS
ANTIBODY SCREEN: NEGATIVE

## 2015-03-15 SURGERY — RADIOLOGY WITH ANESTHESIA
Anesthesia: General | Laterality: Left

## 2015-03-15 MED ORDER — ROCURONIUM BROMIDE 100 MG/10ML IV SOLN
INTRAVENOUS | Status: DC | PRN
  Administered 2015-03-15: 50 mg via INTRAVENOUS

## 2015-03-15 MED ORDER — ROCURONIUM BROMIDE 100 MG/10ML IV SOLN
INTRAVENOUS | Status: AC
  Filled 2015-03-15: qty 1

## 2015-03-15 MED ORDER — ZINC GLUCONATE 50 MG PO TABS: 50.0000 mg | ORAL_TABLET | Freq: Every day | ORAL | Status: DC

## 2015-03-15 MED ORDER — MEPERIDINE HCL 50 MG/ML IJ SOLN: 6.2500 mg | INTRAMUSCULAR | Status: DC | PRN

## 2015-03-15 MED ORDER — LACTATED RINGERS IV SOLN
Freq: Once | INTRAVENOUS | Status: AC
  Administered 2015-03-15 (×2): via INTRAVENOUS

## 2015-03-15 MED ORDER — PHENYLEPHRINE HCL 10 MG/ML IJ SOLN
INTRAMUSCULAR | Status: DC | PRN
  Administered 2015-03-15 (×3): 80 ug via INTRAVENOUS

## 2015-03-15 MED ORDER — ATORVASTATIN CALCIUM 40 MG PO TABS
40.0000 mg | ORAL_TABLET | Freq: Every day | ORAL | Status: DC
  Administered 2015-03-15 – 2015-03-16 (×2): 40 mg via ORAL
  Filled 2015-03-15 (×2): qty 1

## 2015-03-15 MED ORDER — TAMSULOSIN HCL 0.4 MG PO CAPS
0.4000 mg | ORAL_CAPSULE | Freq: Every day | ORAL | Status: DC
  Administered 2015-03-15 – 2015-03-16 (×2): 0.4 mg via ORAL
  Filled 2015-03-15 (×2): qty 1

## 2015-03-15 MED ORDER — ONDANSETRON HCL 4 MG/2ML IJ SOLN: 4.0000 mg | Freq: Four times a day (QID) | INTRAMUSCULAR | Status: DC | PRN

## 2015-03-15 MED ORDER — LIDOCAINE HCL (CARDIAC) 20 MG/ML IV SOLN
INTRAVENOUS | Status: AC
Start: 2015-03-15 — End: 2015-03-15
  Filled 2015-03-15: qty 5

## 2015-03-15 MED ORDER — GLYCOPYRROLATE 0.2 MG/ML IJ SOLN
INTRAMUSCULAR | Status: AC
  Filled 2015-03-15: qty 2

## 2015-03-15 MED ORDER — CEFAZOLIN SODIUM-DEXTROSE 2-3 GM-% IV SOLR
2.0000 g | Freq: Once | INTRAVENOUS | Status: AC
  Administered 2015-03-15: 2 g via INTRAVENOUS
  Filled 2015-03-15: qty 50

## 2015-03-15 MED ORDER — ONDANSETRON HCL 4 MG/2ML IJ SOLN
INTRAMUSCULAR | Status: AC
  Filled 2015-03-15: qty 2

## 2015-03-15 MED ORDER — METOPROLOL TARTRATE 50 MG PO TABS
50.0000 mg | ORAL_TABLET | Freq: Two times a day (BID) | ORAL | Status: DC
  Administered 2015-03-15 – 2015-03-16 (×2): 50 mg via ORAL
  Filled 2015-03-15 (×4): qty 1

## 2015-03-15 MED ORDER — CEFAZOLIN SODIUM-DEXTROSE 2-3 GM-% IV SOLR
INTRAVENOUS | Status: AC
  Filled 2015-03-15: qty 50

## 2015-03-15 MED ORDER — FENTANYL CITRATE (PF) 250 MCG/5ML IJ SOLN
INTRAMUSCULAR | Status: AC
  Filled 2015-03-15: qty 25

## 2015-03-15 MED ORDER — MIDAZOLAM HCL 2 MG/2ML IJ SOLN
INTRAMUSCULAR | Status: AC
  Filled 2015-03-15: qty 4

## 2015-03-15 MED ORDER — ZINC SULFATE 220 (50 ZN) MG PO CAPS
220.0000 mg | ORAL_CAPSULE | Freq: Every day | ORAL | Status: DC
  Administered 2015-03-15 – 2015-03-16 (×2): 220 mg via ORAL
  Filled 2015-03-15 (×2): qty 1

## 2015-03-15 MED ORDER — GLYCOPYRROLATE 0.2 MG/ML IJ SOLN
INTRAMUSCULAR | Status: DC | PRN
  Administered 2015-03-15: 0.4 mg via INTRAVENOUS

## 2015-03-15 MED ORDER — HYDROCHLOROTHIAZIDE 25 MG PO TABS
25.0000 mg | ORAL_TABLET | Freq: Every morning | ORAL | Status: DC
  Administered 2015-03-15 – 2015-03-16 (×2): 25 mg via ORAL
  Filled 2015-03-15 (×2): qty 1

## 2015-03-15 MED ORDER — LISINOPRIL 40 MG PO TABS
40.0000 mg | ORAL_TABLET | Freq: Every morning | ORAL | Status: DC
  Administered 2015-03-16: 40 mg via ORAL
  Filled 2015-03-15: qty 1

## 2015-03-15 MED ORDER — PHENYLEPHRINE HCL 10 MG/ML IJ SOLN
10.0000 mg | INTRAVENOUS | Status: DC | PRN
  Administered 2015-03-15: 50 ug/min via INTRAVENOUS

## 2015-03-15 MED ORDER — PHENYLEPHRINE HCL 10 MG/ML IJ SOLN
INTRAMUSCULAR | Status: AC
  Filled 2015-03-15: qty 1

## 2015-03-15 MED ORDER — ARTIFICIAL TEARS OP OINT
TOPICAL_OINTMENT | OPHTHALMIC | Status: AC
  Filled 2015-03-15: qty 3.5

## 2015-03-15 MED ORDER — HYDROMORPHONE HCL 1 MG/ML IJ SOLN: 0.2500 mg | INTRAMUSCULAR | Status: DC | PRN

## 2015-03-15 MED ORDER — PROPOFOL 10 MG/ML IV BOLUS
INTRAVENOUS | Status: DC | PRN
  Administered 2015-03-15: 40 mg via INTRAVENOUS
  Administered 2015-03-15: 160 mg via INTRAVENOUS

## 2015-03-15 MED ORDER — NEOSTIGMINE METHYLSULFATE 10 MG/10ML IV SOLN
INTRAVENOUS | Status: AC
  Filled 2015-03-15: qty 1

## 2015-03-15 MED ORDER — NEOSTIGMINE METHYLSULFATE 10 MG/10ML IV SOLN
INTRAVENOUS | Status: DC | PRN
  Administered 2015-03-15: 3 mg via INTRAVENOUS

## 2015-03-15 MED ORDER — SUCCINYLCHOLINE CHLORIDE 20 MG/ML IJ SOLN
INTRAMUSCULAR | Status: DC | PRN
  Administered 2015-03-15: 100 mg via INTRAVENOUS

## 2015-03-15 MED ORDER — BETA CAROTENE 25000 UNITS PO CAPS: 25000.0000 [IU] | ORAL_CAPSULE | Freq: Every day | ORAL | Status: DC

## 2015-03-15 MED ORDER — LACTATED RINGERS IV SOLN: INTRAVENOUS | Status: DC

## 2015-03-15 MED ORDER — DOCUSATE SODIUM 100 MG PO CAPS
100.0000 mg | ORAL_CAPSULE | Freq: Two times a day (BID) | ORAL | Status: DC
  Administered 2015-03-15 – 2015-03-16 (×2): 100 mg via ORAL
  Filled 2015-03-15 (×3): qty 1

## 2015-03-15 MED ORDER — DEXAMETHASONE SODIUM PHOSPHATE 10 MG/ML IJ SOLN
INTRAMUSCULAR | Status: AC
  Filled 2015-03-15: qty 1

## 2015-03-15 MED ORDER — AMLODIPINE BESYLATE 5 MG PO TABS
5.0000 mg | ORAL_TABLET | Freq: Every morning | ORAL | Status: DC
  Administered 2015-03-16: 5 mg via ORAL
  Filled 2015-03-15: qty 1

## 2015-03-15 MED ORDER — LIDOCAINE HCL (CARDIAC) 20 MG/ML IV SOLN
INTRAVENOUS | Status: DC | PRN
  Administered 2015-03-15: 75 mg via INTRAVENOUS
  Administered 2015-03-15: 25 mg via INTRATRACHEAL

## 2015-03-15 MED ORDER — SODIUM CHLORIDE 0.9 % IV SOLN
INTRAVENOUS | Status: DC
  Administered 2015-03-15 (×2): via INTRAVENOUS

## 2015-03-15 MED ORDER — ASPIRIN EC 81 MG PO TBEC
81.0000 mg | DELAYED_RELEASE_TABLET | ORAL | Status: DC
  Administered 2015-03-15: 81 mg via ORAL
  Filled 2015-03-15: qty 1

## 2015-03-15 MED ORDER — FINASTERIDE 5 MG PO TABS
5.0000 mg | ORAL_TABLET | Freq: Every day | ORAL | Status: DC
  Administered 2015-03-15 – 2015-03-16 (×2): 5 mg via ORAL
  Filled 2015-03-15 (×2): qty 1

## 2015-03-15 MED ORDER — EPHEDRINE SULFATE 50 MG/ML IJ SOLN
INTRAMUSCULAR | Status: AC
  Filled 2015-03-15: qty 1

## 2015-03-15 MED ORDER — PHENYLEPHRINE 40 MCG/ML (10ML) SYRINGE FOR IV PUSH (FOR BLOOD PRESSURE SUPPORT)
PREFILLED_SYRINGE | INTRAVENOUS | Status: AC
  Filled 2015-03-15: qty 10

## 2015-03-15 MED ORDER — POTASSIUM CHLORIDE CRYS ER 20 MEQ PO TBCR
20.0000 meq | EXTENDED_RELEASE_TABLET | Freq: Two times a day (BID) | ORAL | Status: DC
  Administered 2015-03-15 – 2015-03-16 (×2): 20 meq via ORAL
  Filled 2015-03-15 (×3): qty 1

## 2015-03-15 MED ORDER — PROPOFOL 10 MG/ML IV BOLUS
INTRAVENOUS | Status: AC
  Filled 2015-03-15: qty 20

## 2015-03-15 MED ORDER — PROMETHAZINE HCL 25 MG/ML IJ SOLN: 6.2500 mg | INTRAMUSCULAR | Status: DC | PRN

## 2015-03-15 MED ORDER — FENTANYL CITRATE (PF) 100 MCG/2ML IJ SOLN
INTRAMUSCULAR | Status: DC | PRN
  Administered 2015-03-15 (×2): 50 ug via INTRAVENOUS

## 2015-03-15 MED ORDER — EPHEDRINE SULFATE 50 MG/ML IJ SOLN
INTRAMUSCULAR | Status: DC | PRN
Start: 2015-03-15 — End: 2015-03-15
  Administered 2015-03-15: 10 mg via INTRAVENOUS

## 2015-03-15 MED ORDER — HYDROCODONE-ACETAMINOPHEN 5-325 MG PO TABS: 1.0000 | ORAL_TABLET | ORAL | Status: DC | PRN

## 2015-03-15 NOTE — Anesthesia Procedure Notes (Signed)
Procedure Name: Intubation Date/Time: 03/15/2015 8:37 AM Performed by: Lissa Morales Pre-anesthesia Checklist: Patient identified, Emergency Drugs available, Suction available and Patient being monitored Patient Re-evaluated:Patient Re-evaluated prior to inductionOxygen Delivery Method: Circle System Utilized Preoxygenation: Pre-oxygenation with 100% oxygen Intubation Type: IV induction Ventilation: Mask ventilation without difficulty Laryngoscope Size: Mac and 4 Grade View: Grade II Tube type: Oral Tube size: 8.0 mm Number of attempts: 1 Airway Equipment and Method: Stylet and Oral airway Placement Confirmation: ETT inserted through vocal cords under direct vision,  positive ETCO2 and breath sounds checked- equal and bilateral Secured at: 22 cm Tube secured with: Tape Dental Injury: Teeth and Oropharynx as per pre-operative assessment

## 2015-03-15 NOTE — Procedures (Signed)
Successful LEFT UPPER POLE RCC CRYOABLATION NO COMP STABLE FULL REPORT IN PACS OVERNIGHT RECOVERY

## 2015-03-15 NOTE — Progress Notes (Signed)
Subjective: S/p successful left upper pole RCC cryoablation today. He denies any abdominal or flank pain. He denies any nausea or vomiting.   Allergies: Review of patient's allergies indicates no known allergies.  Medications: Prior to Admission medications   Medication Sig Start Date End Date Taking? Authorizing Provider  amLODipine (NORVASC) 5 MG tablet Take 5 mg by mouth every morning.    Yes Historical Provider, MD  aspirin 81 MG tablet Take 81 mg by mouth every other day.    Yes Historical Provider, MD  atorvastatin (LIPITOR) 40 MG tablet Take 40 mg by mouth daily.   Yes Historical Provider, MD  beta carotene 25000 UNIT capsule Take 25,000 Units by mouth daily.   Yes Historical Provider, MD  finasteride (PROSCAR) 5 MG tablet Take 5 mg by mouth daily.   Yes Historical Provider, MD  hydrochlorothiazide (HYDRODIURIL) 25 MG tablet Take 25 mg by mouth every morning.    Yes Historical Provider, MD  lisinopril (PRINIVIL,ZESTRIL) 40 MG tablet Take 40 mg by mouth every morning.    Yes Historical Provider, MD  metoprolol (LOPRESSOR) 50 MG tablet Take 50 mg by mouth 2 (two) times daily.   Yes Historical Provider, MD  potassium chloride SA (K-DUR,KLOR-CON) 20 MEQ tablet Take 20 mEq by mouth 2 (two) times daily.   Yes Historical Provider, MD  tamsulosin (FLOMAX) 0.4 MG CAPS capsule Take 0.4 mg by mouth.   Yes Historical Provider, MD  zinc gluconate 50 MG tablet Take 50 mg by mouth daily.   Yes Historical Provider, MD   Vital Signs: BP 147/73 mmHg  Pulse 64  Temp(Src) 97.5 F (36.4 C) (Oral)  Resp 18  Ht '5\' 8"'$  (1.727 m)  Wt 219 lb 12.8 oz (99.7 kg)  BMI 33.43 kg/m2  SpO2 97%  Physical Exam General: A&Ox3, NAD, sitting up in bed Abd: Soft, NT, Left flank dressing C/D/I-NT, no signs of bleeding or hematoma  Foley: Clear yellow urine   Imaging: Ct Guide Tissue Ablation  03/15/2015   CLINICAL DATA:  12 mm left upper pole enhancing renal neoplasm compatible with a renal cell carcinoma   EXAM: CT-GUIDED PERCUTANEOUS CRYOABLATION OF LEFT UPPER POLE RENAL CELL CARCINOMA  ANESTHESIA/SEDATION: General  MEDICATIONS: 2 gram IV Ancef. The antibiotic was administered in an appropriate time interval prior to needle puncture of the skin.  CONTRAST:  None.  PROCEDURE: The procedure, risks, benefits, and alternatives were explained to the patient. Questions regarding the procedure were encouraged and answered. The patient understands and consents to the procedure.  The patient was placed under general anesthesia. Initial unenhanced CT was performed in a prone position to localize the left upper pole renal cell carcinoma.  The left posterior flank was prepped with Betadine in a sterile fashion, and a sterile drape was applied covering the operative field. A sterile gown and sterile gloves were used for the procedure.  Under CT guidance, an Ice Pearl 2.1 percutaneous cryoablation probe was advanced into the left upper pole renal mass. Probe positioning was confirmed by CT prior to cryoablation.  Cryoablation was performed through the [employer] probe. Initial 10 minute cycle of cryoablation was performed. This was followed by a 8 minute thaw cycle. A second 10 minute cycle of cryoablation was then performed. During ablation, periodic CT imaging was performed to monitor ice ball formation and morphology. After active thaw and cautery, the cryoablation probe was removed.  Post-procedural CT was performed.  COMPLICATIONS: None  FINDINGS: CT imaging confirms cryoablation probe insertion to the  left upper pole renal mass. Serial CT imaging during the procedure demonstrates adequate coverage by the visualized ice ball. Postprocedure imaging demonstrates no significant perinephric or subcapsular hemorrhage. No hydronephrosis.  IMPRESSION: CT guided percutaneous cryoablation of a left upper pole small renal cell carcinoma. The patient will be observed overnight. Initial follow-up will be performed in approximately 4  weeks.   Electronically Signed   By: Jerilynn Mages.  Shick M.D.   On: 03/15/2015 10:58    Labs:  CBC:  Recent Labs  07/09/14 1736 07/10/14 0445 03/12/15 1457  WBC 7.3 7.4 9.0  HGB 15.6 14.3 13.5  HCT 46.8 42.7 41.6  PLT 211 170 196    COAGS:  Recent Labs  07/09/14 1736 03/12/15 1457  INR 1.0 1.03  APTT 27.7 31    BMP:  Recent Labs  07/09/14 1736 07/10/14 0445 07/11/14 0421 03/12/15 1457  NA 142 141 142 141  K 3.2* 2.7* 3.0* 3.1*  CL 106 106 109* 102  CO2 '29 28 27 31  '$ GLUCOSE 108* 85 107* 113*  BUN 24* 21* 14 19  CALCIUM 8.2* 8.0* 8.0* 8.8*  CREATININE 0.89 0.71 0.74 0.87  GFRNONAA >60 >60 >60 >60  GFRAA >60 >60 >60 >60    LIVER FUNCTION TESTS:  Recent Labs  07/09/14 1736  BILITOT 0.4  AST 28  ALT 25  ALKPHOS 109  PROT 6.6  ALBUMIN 3.3*    Assessment and Plan: 1.2 cm solid enhancing left upper pole renal mass compatible with small renal cell carcinoma by imaging S/p successful left upper pole RCC cryoablation today Extubated, doing well without complaints Admit overnight for observation and labs, remove foley tonight, advance diet and ambulate as tolerated Discharge in am if stable, F/u 4 weeks with Dr. Annamaria Boots in IR clinic     Signed: Hedy Jacob 03/15/2015, 3:20 PM

## 2015-03-15 NOTE — Anesthesia Postprocedure Evaluation (Signed)
  Anesthesia Post-op Note  Patient: [name] [name]  Procedure(s) Performed: Procedure(s): LEFT CRYO RENAL ABLATION   (RADIOLOGY WITH ANESTHESIA) (Left)  Patient Location: PACU  Anesthesia Type:General  Level of Consciousness: awake, alert  and oriented  Airway and Oxygen Therapy: Patient Spontanous Breathing  Post-op Pain: minimal  Post-op Assessment: Post-op Vital signs reviewed and Patient's Cardiovascular Status Stable              Post-op Vital Signs: Reviewed and stable  Last Vitals:  Filed Vitals:   03/15/15 1140  BP: 123/65  Pulse: 57  Temp: 36.4 C  Resp: 15    Complications: No apparent anesthesia complications

## 2015-03-15 NOTE — Progress Notes (Signed)
PHARMACIST - PHYSICIAN ORDER COMMUNICATION  CONCERNING: P&T Medication Policy on Herbal Medications  DESCRIPTION:  This patient's order for: beta carotene  has been noted.  This product(s) is classified as an "herbal" or natural product. Due to a lack of definitive safety studies or FDA approval, nonstandard manufacturing practices, plus the potential risk of unknown drug-drug interactions while on inpatient medications, the Pharmacy and Therapeutics Committee does not permit the use of "herbal" or natural products of this type within [facility].   ACTION TAKEN: The pharmacy department is unable to verify this order at this time. Please reevaluate patient's clinical condition at discharge and address if the herbal or natural product(s) should be resumed at that time.  Lenice Koper Pharmd, BCPS 03/15/15 11:53 AM

## 2015-03-15 NOTE — H&P (Signed)
Referring Physician(s): Hollice Espy  History of Present Illness: [name] [name] is a 81 y.o. male with a 1.2 cm solid enhancing left upper pole renal mass compatible with small renal cell carcinoma by imaging. He has been seen in full consult on 02/07/15 and scheduled today for image guided left renal mass cryoablation with general anesthesia. He denies any chest pain, shortness of breath or palpitations. He denies any active signs of bleeding or excessive bruising. He denies any recent fever or chills. He denies any nausea or vomiting. He denies any hematuria or current left flank pain.     Past Medical History  Diagnosis Date  . Hypertension   . Cancer 45 years ago melanoma  . Enlarged prostate 11/21/14  . Renal cancer   . Stroke [id number]    slight weakness RT side  . Arthritis   . GERD (gastroesophageal reflux disease)   . History of kidney stones     Past Surgical History  Procedure Laterality Date  . Cholecystectomy N/A   . Joint replacement Bilateral 4-5 years ago    kness replaced   . Skin cancer excision  1973  . Laparotomy  2004    1 week after choley "kinicked bile duct"    Allergies: Review of patient's allergies indicates no known allergies.  Medications: Prior to Admission medications   Medication Sig Start Date End Date Taking? Authorizing Provider  amLODipine (NORVASC) 5 MG tablet Take 5 mg by mouth every morning.    Yes Historical Provider, MD  atorvastatin (LIPITOR) 40 MG tablet Take 40 mg by mouth daily.   Yes Historical Provider, MD  beta carotene 25000 UNIT capsule Take 25,000 Units by mouth daily.   Yes Historical Provider, MD  finasteride (PROSCAR) 5 MG tablet Take 5 mg by mouth daily.   Yes Historical Provider, MD  hydrochlorothiazide (HYDRODIURIL) 25 MG tablet Take 25 mg by mouth every morning.    Yes Historical Provider, MD  lisinopril (PRINIVIL,ZESTRIL) 40 MG tablet Take 40 mg by mouth every morning.    Yes Historical Provider, MD  metoprolol  (LOPRESSOR) 50 MG tablet Take 50 mg by mouth 2 (two) times daily.   Yes Historical Provider, MD  potassium chloride SA (K-DUR,KLOR-CON) 20 MEQ tablet Take 20 mEq by mouth 2 (two) times daily.   Yes Historical Provider, MD  tamsulosin (FLOMAX) 0.4 MG CAPS capsule Take 0.4 mg by mouth.   Yes Historical Provider, MD  zinc gluconate 50 MG tablet Take 50 mg by mouth daily.   Yes Historical Provider, MD  aspirin 81 MG tablet Take 81 mg by mouth every other day.     Historical Provider, MD     Family History  Problem Relation Age of Onset  . Cancer Brother   . Cancer Sister   . Cancer Sister   . Cancer - Prostate Brother     Social History   Social History  . Marital Status: Single    Spouse Name: N/A  . Number of Children: N/A  . Years of Education: N/A   Social History Main Topics  . Smoking status: Former Smoker    Types: Cigarettes    Quit date: 11/20/1968  . Smokeless tobacco: None  . Alcohol Use: No  . Drug Use: No  . Sexual Activity: Not Asked   Other Topics Concern  . None   Social History Narrative    Review of Systems: A 12 point ROS discussed and pertinent positives are indicated in the HPI  above.  All other systems are negative.  Review of Systems  Vital Signs: BP 162/84 mmHg  Pulse 66  Temp(Src) 98.1 F (36.7 C) (Oral)  Resp 16  Ht '5\' 8"'$  (1.727 m)  Wt 215 lb (97.523 kg)  BMI 32.70 kg/m2  SpO2 97%  Physical Exam  Constitutional: He is oriented to person, place, and time. No distress.  HENT:  Head: Normocephalic and atraumatic.  Cardiovascular: Normal rate and regular rhythm.  Exam reveals no gallop and no friction rub.   No murmur heard. Pulmonary/Chest: Effort normal. No respiratory distress. He has no wheezes. He has no rales.  Diminished BS throughout  Abdominal: Soft. Bowel sounds are normal. He exhibits distension. There is no tenderness.  Neurological: He is alert and oriented to person, place, and time.  Skin: He is not diaphoretic.     Mallampati Score:  MD Evaluation Airway: WNL Heart: WNL Abdomen: WNL Chest/ Lungs: WNL ASA  Classification: 3 Mallampati/Airway Score: Two  Imaging: No results found.  Labs:  CBC:  Recent Labs  07/09/14 1736 07/10/14 0445 03/12/15 1457  WBC 7.3 7.4 9.0  HGB 15.6 14.3 13.5  HCT 46.8 42.7 41.6  PLT 211 170 196    COAGS:  Recent Labs  07/09/14 1736 03/12/15 1457  INR 1.0 1.03  APTT 27.7 31    BMP:  Recent Labs  07/09/14 1736 07/10/14 0445 07/11/14 0421 03/12/15 1457  NA 142 141 142 141  K 3.2* 2.7* 3.0* 3.1*  CL 106 106 109* 102  CO2 '29 28 27 31  '$ GLUCOSE 108* 85 107* 113*  BUN 24* 21* 14 19  CALCIUM 8.2* 8.0* 8.0* 8.8*  CREATININE 0.89 0.71 0.74 0.87  GFRNONAA >60 >60 >60 >60  GFRAA >60 >60 >60 >60    LIVER FUNCTION TESTS:  Recent Labs  07/09/14 1736  BILITOT 0.4  AST 28  ALT 25  ALKPHOS 109  PROT 6.6  ALBUMIN 3.3*    Assessment and Plan: 1.2 cm solid enhancing left upper pole renal mass compatible with small renal cell carcinoma by imaging Seen in full consult on 02/07/15 Scheduled today for image guided left renal mass cryoablation with general anesthesia  History of CVA 2015 BPH HTN The patient has been NPO, no blood thinners taken, labs and vitals have been reviewed. Risks and Benefits discussed with the patient including, but not limited to bleeding, infection, damage to adjacent structures, decrease in renal function or post procedural neuropathy. All of the patient's questions were answered, patient is agreeable to proceed. Consent signed and in chart.    Thank you for this interesting consult.  I greatly enjoyed meeting [name] [name] and look forward to participating in their care.  A copy of this report was sent to the requesting provider on this date.  SignedHedy Jacob 03/15/2015, 8:40 AM

## 2015-03-15 NOTE — Anesthesia Preprocedure Evaluation (Addendum)
Anesthesia Evaluation  Patient identified by MRN, date of birth, ID band Patient awake    Reviewed: Allergy & Precautions, NPO status , Patient's Chart, lab work & pertinent test results, reviewed documented beta blocker date and time   Airway Mallampati: II  TM Distance: >3 FB Neck ROM: Full    Dental  (+) Teeth Intact   Pulmonary former smoker,  breath sounds clear to auscultation        Cardiovascular hypertension, Pt. on medications and Pt. on home beta blockers Rhythm:Regular Rate:Normal     Neuro/Psych CVA negative psych ROS   GI/Hepatic GERD-  ,  Endo/Other    Renal/GU Renal InsufficiencyRenal disease  negative genitourinary   Musculoskeletal  (+) Arthritis -,   Abdominal   Peds negative pediatric ROS (+)  Hematology negative hematology ROS (+)   Anesthesia Other Findings   Reproductive/Obstetrics                            Lab Results  Component Value Date   WBC 9.0 03/12/2015   HGB 13.5 03/12/2015   HCT 41.6 03/12/2015   MCV 84.6 03/12/2015   PLT 196 03/12/2015   Lab Results  Component Value Date   CREATININE 0.87 03/12/2015   BUN 19 03/12/2015   NA 141 03/12/2015   K 3.1* 03/12/2015   CL 102 03/12/2015   CO2 31 03/12/2015   Lab Results  Component Value Date   INR 1.03 03/12/2015   INR 1.0 07/09/2014   INR 1.0 08/10/2007   EKG: sinus bradycardia, 1st degree AV block.   Anesthesia Physical Anesthesia Plan  ASA: II  Anesthesia Plan: General   Post-op Pain Management:    Induction: Intravenous  Airway Management Planned: Oral ETT  Additional Equipment:   Intra-op Plan:   Post-operative Plan: Extubation in OR  Informed Consent: I have reviewed the patients History and Physical, chart, labs and discussed the procedure including the risks, benefits and alternatives for the proposed anesthesia with the patient or authorized representative who has indicated  his/her understanding and acceptance.   Dental advisory given  Plan Discussed with: CRNA  Anesthesia Plan Comments:         Anesthesia Quick Evaluation

## 2015-03-15 NOTE — Transfer of Care (Signed)
Immediate Anesthesia Transfer of Care Note  Patient: [name] [name]  Procedure(s) Performed: Procedure(s): LEFT CRYO RENAL ABLATION   (RADIOLOGY WITH ANESTHESIA) (Left)  Patient Location: PACU  Anesthesia Type:General  Level of Consciousness: awake, alert , oriented and patient cooperative  Airway & Oxygen Therapy: Patient Spontanous Breathing and Patient connected to face mask oxygen  Post-op Assessment: Report given to RN, Post -op Vital signs reviewed and stable and Patient moving all extremities X 4  Post vital signs: stable  Last Vitals: There were no vitals filed for this visit.  Complications: No apparent anesthesia complications  Right side weakness as preop

## 2015-03-16 ENCOUNTER — Other Ambulatory Visit: Payer: Self-pay | Admitting: Physician Assistant

## 2015-03-16 DIAGNOSIS — C642 Malignant neoplasm of left kidney, except renal pelvis: Secondary | ICD-10-CM | POA: Diagnosis not present

## 2015-03-16 DIAGNOSIS — N2889 Other specified disorders of kidney and ureter: Secondary | ICD-10-CM

## 2015-03-16 LAB — BASIC METABOLIC PANEL
Anion gap: 6 (ref 5–15)
BUN: 16 mg/dL (ref 6–20)
CALCIUM: 8.1 mg/dL — AB (ref 8.9–10.3)
CHLORIDE: 104 mmol/L (ref 101–111)
CO2: 28 mmol/L (ref 22–32)
CREATININE: 0.73 mg/dL (ref 0.61–1.24)
GFR calc non Af Amer: >60 mL/min (ref >60)
Glucose, Bld: 101 mg/dL — ABNORMAL HIGH (ref 65–99)
Potassium: 2.9 mmol/L — ABNORMAL LOW (ref 3.5–5.1)
SODIUM: 138 mmol/L (ref 135–145)

## 2015-03-16 LAB — CBC
HCT: 39.1 % (ref 39.0–52.0)
HEMOGLOBIN: 13 g/dL (ref 13.0–17.0)
MCH: 28.5 pg (ref 26.0–34.0)
MCHC: 33.2 g/dL (ref 30.0–36.0)
MCV: 85.7 fL (ref 78.0–100.0)
Platelets: 202 10*3/uL (ref 150–400)
RBC: 4.56 MIL/uL (ref 4.22–5.81)
RDW: 13.5 % (ref 11.5–15.5)
WBC: 8.9 10*3/uL (ref 4.0–10.5)

## 2015-03-16 NOTE — Discharge Summary (Signed)
Patient ID: [name] [name] MRN: [id number] DOB/AGE: 1931/09/02 81 y.o.  Admit date: 03/15/2015 Discharge date: 03/16/2015  Admission Diagnoses: 1.2 solid enhancing left upper pole renal mass  Discharge Diagnoses:  Active Problems:   Renal cell cancer   Discharged Condition: good  Hospital Course: [name] [name] is a 81 y.o. male with an incidentally found left upper pole renal mass by CT. For further evaluation, MRI was performed. This confirmed a 1.2 cm solid enhancing left upper pole renal mass compatible with a small asymptomatic renal cell carcinoma.    He underwent a CT-GUIDED percutaneous cryoblation of left upper pole renal cell carcinoma yesterday by Dr. Annamaria Boots.  He was admitted overnight for observation.  He did well last night.  He his having no pain at all.  He is doing very well this morning.  He denies N/V, denies Fever or chills  He has ambulated without difficulty.  He is voiding as per his normal.  He stable for discharge home today. He is resume all previous home medications.  He understands he is to increase activity slowly and refrain from any strenuous activity for at least 72 hours. (He does inform me that he had a CVA last December and his activity is minimally strenuous normally)  He will follow up with Dr. Annamaria Boots in 4 weeks, office will call with this appointment date and time.  Consults: None   Treatments:   CLINICAL DATA: 12 mm left upper pole enhancing renal neoplasm compatible with a renal cell carcinoma  EXAM: CT-GUIDED PERCUTANEOUS CRYOABLATION OF LEFT UPPER POLE RENAL CELL CARCINOMA  ANESTHESIA/SEDATION: General  MEDICATIONS: 2 gram IV Ancef. The antibiotic was administered in an appropriate time interval prior to needle puncture of the skin.  CONTRAST: None.  PROCEDURE: The procedure, risks, benefits, and alternatives were explained to the patient. Questions regarding the procedure were encouraged and answered. The patient  understands and consents to the procedure.  The patient was placed under general anesthesia. Initial unenhanced CT was performed in a prone position to localize the left upper pole renal cell carcinoma.  The left posterior flank was prepped with Betadine in a sterile fashion, and a sterile drape was applied covering the operative field. A sterile gown and sterile gloves were used for the procedure.  Under CT guidance, an Ice Pearl 2.1 percutaneous cryoablation probe was advanced into the left upper pole renal mass. Probe positioning was confirmed by CT prior to cryoablation.  Cryoablation was performed through the [employer] probe. Initial 10 minute cycle of cryoablation was performed. This was followed by a 8 minute thaw cycle. A second 10 minute cycle of cryoablation was then performed. During ablation, periodic CT imaging was performed to monitor ice ball formation and morphology. After active thaw and cautery, the cryoablation probe was removed.  Post-procedural CT was performed.  COMPLICATIONS: None  FINDINGS: CT imaging confirms cryoablation probe insertion to the left upper pole renal mass. Serial CT imaging during the procedure demonstrates adequate coverage by the visualized ice ball. Postprocedure imaging demonstrates no significant perinephric or subcapsular hemorrhage. No hydronephrosis.  IMPRESSION: CT guided percutaneous cryoablation of a left upper pole small renal cell carcinoma. The patient will be observed overnight. Initial follow-up will be performed in approximately 4 weeks.   Electronically Signed  By: Jerilynn Mages. Shick M.D.  On: 03/15/2015 10:58     Discharge Exam: Blood pressure 146/69, pulse 68, temperature 98.1 F (36.7 C), temperature source Oral, resp. rate 18, height '5\' 8"'$  (1.727 m), weight  219 lb 12.8 oz (99.7 kg), SpO2 93 %.  Awake and Alert Doing well Heart RRR Lungs Clear bilaterally Abdomen soft, NTND Entry site without  erythema, bruising, or hematoma.  Disposition:   Discharge Instructions    Call MD for:  difficulty breathing, headache or visual disturbances    Complete by:  As directed      Call MD for:  extreme fatigue    Complete by:  As directed      Call MD for:  hives    Complete by:  As directed      Call MD for:  persistant dizziness or light-headedness    Complete by:  As directed      Call MD for:  persistant nausea and vomiting    Complete by:  As directed      Call MD for:  redness, tenderness, or signs of infection (pain, swelling, redness, odor or green/yellow discharge around incision site)    Complete by:  As directed      Call MD for:  severe uncontrolled pain    Complete by:  As directed      Call MD for:  temperature >100.4    Complete by:  As directed      Diet - low sodium heart healthy    Complete by:  As directed      Discharge instructions    Complete by:  As directed   No strenuous activity x 72 hours. Office will call with follow up appointment. If you have any questions or concerns, call [phone]     Increase activity slowly    Complete by:  As directed             Medication List    TAKE these medications        amLODipine 5 MG tablet  Commonly known as:  NORVASC  Take 5 mg by mouth every morning.     aspirin 81 MG tablet  Take 81 mg by mouth every other day.     atorvastatin 40 MG tablet  Commonly known as:  LIPITOR  Take 40 mg by mouth daily.     beta carotene 25000 UNIT capsule  Take 25,000 Units by mouth daily.     finasteride 5 MG tablet  Commonly known as:  PROSCAR  Take 5 mg by mouth daily.     hydrochlorothiazide 25 MG tablet  Commonly known as:  HYDRODIURIL  Take 25 mg by mouth every morning.     lisinopril 40 MG tablet  Commonly known as:  PRINIVIL,ZESTRIL  Take 40 mg by mouth every morning.     metoprolol 50 MG tablet  Commonly known as:  LOPRESSOR  Take 50 mg by mouth 2 (two) times daily.     potassium chloride SA 20 MEQ  tablet  Commonly known as:  K-DUR,KLOR-CON  Take 20 mEq by mouth 2 (two) times daily.     tamsulosin 0.4 MG Caps capsule  Commonly known as:  FLOMAX  Take 0.4 mg by mouth.     zinc gluconate 50 MG tablet  Take 50 mg by mouth daily.          Signed: Murrell Redden PA-C 03/16/2015, 8:24 AM   I have spent Less Than 30 Minutes discharging [name] [name].

## 2015-03-18 ENCOUNTER — Other Ambulatory Visit (HOSPITAL_COMMUNITY): Payer: Self-pay | Admitting: Interventional Radiology

## 2015-03-18 ENCOUNTER — Other Ambulatory Visit: Payer: Self-pay | Admitting: *Deleted

## 2015-03-18 DIAGNOSIS — N2889 Other specified disorders of kidney and ureter: Secondary | ICD-10-CM

## 2015-04-16 ENCOUNTER — Telehealth: Payer: Self-pay | Admitting: Radiology

## 2015-04-16 ENCOUNTER — Inpatient Hospital Stay: Admission: RE | Admit: 2015-04-16 | Payer: Medicare Other | Source: Ambulatory Visit

## 2015-04-16 NOTE — Telephone Encounter (Signed)
No show for 2:30 pm follow up appointment with Dr Annamaria Boots.  Left message on son's voice mail (M#) requesting call fpr status update & to reschedule appointment.  Henri Riki Rusk, RN 04/16/2015 3:14 PM

## 2015-04-25 ENCOUNTER — Ambulatory Visit
Admission: RE | Admit: 2015-04-25 | Discharge: 2015-04-25 | Disposition: A | Discharge status: Home / Self Care | Payer: Medicare Other | Source: Ambulatory Visit | Attending: Interventional Radiology | Admitting: Interventional Radiology

## 2015-04-25 DIAGNOSIS — N2889 Other specified disorders of kidney and ureter: Secondary | ICD-10-CM

## 2015-04-25 NOTE — Progress Notes (Signed)
Patient ID: [name] [name], male   DOB: 09-Jul-1932, 81 y.o.   MRN: [id number]       Chief Complaint: Patient was seen in consultation today for  Chief Complaint  Patient presents with  . Follow-up    6 wk follow up Left Renal Cryoablation     at the request of Lus Kriegel  Referring Physician(s): Hollice Espy  History of Present Illness: PAYMON ROSENSTEEL is a 81 y.o. male with a 1.2 cm solid enhancing left upper pole renal mass compatible with a renal cell carcinoma. He underwent uneventful CT-guided cryoablation under general anesthesia performed 03/15/2015. He had an uneventful recovery overnight. He was discharged the following day. He has recovered at home. No significant abdominal pain, flank pain, fever, or hematuria. He is back to his baseline physically. He returns for routine postop follow-up.  Past Medical History  Diagnosis Date  . Hypertension   . Cancer 45 years ago melanoma  . Enlarged prostate 11/21/14  . Renal cancer   . Stroke [id number]    slight weakness RT side  . Arthritis   . GERD (gastroesophageal reflux disease)   . History of kidney stones     Past Surgical History  Procedure Laterality Date  . Cholecystectomy N/A   . Joint replacement Bilateral 4-5 years ago    kness replaced   . Skin cancer excision  1973  . Laparotomy  2004    1 week after choley "kinicked bile duct"    Allergies: Review of patient's allergies indicates no known allergies.  Medications: Prior to Admission medications   Medication Sig Start Date End Date Taking? Authorizing Provider  amLODipine (NORVASC) 5 MG tablet Take 5 mg by mouth every morning.    Yes Historical Provider, MD  aspirin 81 MG tablet Take 81 mg by mouth every other day.    Yes Historical Provider, MD  atorvastatin (LIPITOR) 40 MG tablet Take 40 mg by mouth daily.   Yes Historical Provider, MD  finasteride (PROSCAR) 5 MG tablet Take 5 mg by mouth daily.   Yes Historical Provider, MD  hydrochlorothiazide  (HYDRODIURIL) 25 MG tablet Take 25 mg by mouth every morning.    Yes Historical Provider, MD  lisinopril (PRINIVIL,ZESTRIL) 40 MG tablet Take 40 mg by mouth every morning.    Yes Historical Provider, MD  metoprolol (LOPRESSOR) 50 MG tablet Take 50 mg by mouth 2 (two) times daily.   Yes Historical Provider, MD  potassium chloride SA (K-DUR,KLOR-CON) 20 MEQ tablet Take 20 mEq by mouth 2 (two) times daily.   Yes Historical Provider, MD  tamsulosin (FLOMAX) 0.4 MG CAPS capsule Take 0.4 mg by mouth.   Yes Historical Provider, MD  zinc gluconate 50 MG tablet Take 50 mg by mouth daily.   Yes Historical Provider, MD  beta carotene 25000 UNIT capsule Take 25,000 Units by mouth daily.    Historical Provider, MD     Family History  Problem Relation Age of Onset  . Cancer Brother   . Cancer Sister   . Cancer Sister   . Cancer - Prostate Brother     Social History   Social History  . Marital Status: Single    Spouse Name: N/A  . Number of Children: N/A  . Years of Education: N/A   Social History Main Topics  . Smoking status: Former Smoker    Types: Cigarettes    Quit date: 11/20/1968  . Smokeless tobacco: Not on file  . Alcohol Use: No  . Drug  Use: No  . Sexual Activity: Not on file   Other Topics Concern  . Not on file   Social History Narrative    Review of Systems: A 12 point ROS discussed and pertinent positives are indicated in the HPI above.  All other systems are negative.  Review of Systems  Constitutional: Negative for fever, activity change, appetite change and fatigue.  Respiratory: Negative for chest tightness and shortness of breath.   Cardiovascular: Negative for chest pain.  Gastrointestinal: Negative for abdominal distention.  Genitourinary: Positive for frequency. Negative for hematuria and flank pain.    Vital Signs: BP 152/73 mmHg  Pulse 63  Temp(Src) 97.6 F (36.4 C) (Oral)  Resp 14  SpO2 95%  Physical Exam  Constitutional: He appears well-developed  and well-nourished. No distress.  Cardiovascular: Normal rate and regular rhythm.   No murmur heard. Pulmonary/Chest: Effort normal and breath sounds normal. No respiratory distress. He has no wheezes.  Abdominal: Soft. Bowel sounds are normal. He exhibits no distension.  Left flank puncture site is well-healed.  Skin: He is not diaphoretic.     Imaging: No results found.  Labs:  CBC:  Recent Labs  07/09/14 1736 07/10/14 0445 03/12/15 1457 03/16/15 0404  WBC 7.3 7.4 9.0 8.9  HGB 15.6 14.3 13.5 13.0  HCT 46.8 42.7 41.6 39.1  PLT 211 170 196 202    COAGS:  Recent Labs  07/09/14 1736 03/12/15 1457  INR 1.0 1.03  APTT 27.7 31    BMP:  Recent Labs  07/10/14 0445 07/11/14 0421 03/12/15 1457 03/16/15 0404  NA 141 142 141 138  K 2.7* 3.0* 3.1* 2.9*  CL 106 109* 102 104  CO2 '28 27 31 28  '$ GLUCOSE 85 107* 113* 101*  BUN 21* '14 19 16  '$ CALCIUM 8.0* 8.0* 8.8* 8.1*  CREATININE 0.71 0.74 0.87 0.73  GFRNONAA >60 >60 >60 >60  GFRAA >60 >60 >60 >60    LIVER FUNCTION TESTS:  Recent Labs  07/09/14 1736  BILITOT 0.4  AST 28  ALT 25  ALKPHOS 109  PROT 6.6  ALBUMIN 3.3*    Assessment and Plan:  1 month status post left upper pole renal cell carcinoma cryoablation. Procedure performed 03/15/2015. He had an uneventful recovery at a hospital overnight. He has recovered at home very well. He is back to his baseline physical status. No abdominal pain, flank pain or hematuria. No interval imaging since treatment. Overall he is doing very well.  Follow-up CT will be performed in 3 months at [facility] outpatient center to establish a new baseline. I'll see him back in the office after the scan.  Thank you for this interesting consult.  I greatly enjoyed meeting GILBERT MANOLIS and look forward to participating in their care.  A copy of this report was sent to the requesting provider on this date.  SignedGreggory Keen 04/25/2015, 9:37 AM   I spent a total of     25 Minutes in face to face in clinical consultation, greater than 50% of which was counseling/coordinating care for this patient with a left renal cell carcinoma.

## 2015-07-30 ENCOUNTER — Ambulatory Visit: Payer: Self-pay | Admitting: Urology

## 2015-07-31 ENCOUNTER — Other Ambulatory Visit (HOSPITAL_COMMUNITY): Payer: Self-pay | Admitting: Interventional Radiology

## 2015-07-31 DIAGNOSIS — N2889 Other specified disorders of kidney and ureter: Secondary | ICD-10-CM

## 2015-08-05 ENCOUNTER — Other Ambulatory Visit: Payer: Self-pay | Admitting: *Deleted

## 2015-08-05 DIAGNOSIS — N2889 Other specified disorders of kidney and ureter: Secondary | ICD-10-CM

## 2015-08-09 ENCOUNTER — Encounter: Payer: Self-pay | Admitting: Urology

## 2015-08-09 ENCOUNTER — Ambulatory Visit (INDEPENDENT_AMBULATORY_CARE_PROVIDER_SITE_OTHER): Payer: Medicare Other | Admitting: Urology

## 2015-08-09 VITALS — BP 170/83 | HR 62 | Ht 68.0 in | Wt 222.5 lb

## 2015-08-09 DIAGNOSIS — Z859 Personal history of malignant neoplasm, unspecified: Secondary | ICD-10-CM | POA: Insufficient documentation

## 2015-08-09 DIAGNOSIS — N138 Other obstructive and reflux uropathy: Secondary | ICD-10-CM

## 2015-08-09 DIAGNOSIS — M1990 Unspecified osteoarthritis, unspecified site: Secondary | ICD-10-CM | POA: Insufficient documentation

## 2015-08-09 DIAGNOSIS — I10 Essential (primary) hypertension: Secondary | ICD-10-CM | POA: Insufficient documentation

## 2015-08-09 DIAGNOSIS — K219 Gastro-esophageal reflux disease without esophagitis: Secondary | ICD-10-CM | POA: Insufficient documentation

## 2015-08-09 DIAGNOSIS — N401 Enlarged prostate with lower urinary tract symptoms: Secondary | ICD-10-CM | POA: Diagnosis not present

## 2015-08-09 DIAGNOSIS — N400 Enlarged prostate without lower urinary tract symptoms: Secondary | ICD-10-CM | POA: Insufficient documentation

## 2015-08-09 LAB — URINALYSIS, COMPLETE
Bilirubin, UA: NEGATIVE
Glucose, UA: NEGATIVE
Nitrite, UA: NEGATIVE
Specific Gravity, UA: 1.02 (ref 1.005–1.030)
Urobilinogen, Ur: 0.2 mg/dL (ref 0.2–1.0)
pH, UA: 6 (ref 5.0–7.5)

## 2015-08-09 LAB — MICROSCOPIC EXAMINATION

## 2015-08-09 LAB — BLADDER SCAN AMB NON-IMAGING

## 2015-08-09 NOTE — Patient Instructions (Signed)
Urodynamic Testing  WHAT IS URODYNAMIC TESTING?  Urodynamic tests are done to determine how well your lower urinary tract is working. The lower urinary tract includes your bladder and the tube that empties your bladder (urethra).   When your kidneys filter your blood, urine is stored in your bladder until you feel the urge to pass urine (urinate). Urination requires coordination between the nerves and muscles of your bladder and urethra. When your lower urinary tract is working well, you should be able to:   · Start urinating when your bladder is full.  · Empty your bladder completely.  · Control the flow of your urine.  WHY DO I NEED URODYNAMIC TESTING?  You may need urodynamic testing if you:  · Are leaking urine (incontinence).  · Have problems starting or stopping your urine flow.  · Have frequent or painful urination.  · Have frequent urinary tract infections.  · Cannot empty your bladder completely.  · Have strong urges to pass urine (urgency).  · Have a weak flow of urine.  HOW IS URODYNAMIC TESTING DONE?  Urodynamic tests may be done separately or all during one testing visit. These tests may be done at your health care provider's office, a clinic, or a hospital. You may be given an antibiotic medicine before or after testing to prevent infection. Ask your health care provider if you should:  · Stop taking any of your regular medicines.  · Arrive for the test with a full bladder.  The urodynamic tests you may have done include:  Uroflowmetry  This test measures how much urine you pass and how long it takes to pass.  · You sit on a special toilet to urinate.  · The toilet measures the volume and the time of your urine flow.  · These measurements are sent to a computer that creates a graph of your urine flow.  Postvoid residual measurement  This test measures how much urine is left in your bladder after you urinate.  · It uses sound waves (ultrasound) to create an image of your bladder.  · The test can also be  done by inserting a thin, flexible tube (catheter) into your bladder after you urinate.  · Remaining urine is measured in milliliters (mL). If you have more than 100 mL left in your bladder after you urinate, your bladder is not emptying as it should.  Cystometric testing  This test uses a special bladder catheter that can measure pressure.   · A numbing medicine (local anesthetic) may be used.  · First, a normal catheter is used to empty your bladder completely.  · Then the measuring catheter is placed, and your bladder is filled with warm, germ-free (sterile) water.  · Pressure measurements will be taken:  ¨ As your bladder fills.  ¨ When you feel the need to urinate.  ¨ As your bladder is emptied.  · You may be asked to cough or bear down to check for leakage.  · In some cases, your bladder may be filled with a material that shows up on X-rays (contrast material) so that X-ray pictures can be taken during the test.  Electromyography  This test measures the electrical activity of the nerves and muscles of your bladder and the opening of your urethra.   · It tells how well your nerves are communicating with your muscles.  · Sticky patches are placed near your rectum and urethra to measure electrical activity.  WHAT ARE THE RISKS OF THIS TESTING?    Generally, these tests are safe. However, problems can occur and include:  · Discomfort.  · Frequent urge to urinate.  · Bleeding.  · Infection.  · An allergic reaction to contrast material, if contrast material is used.  WHAT HAPPENS AFTER THE TESTING?   · You should be able to go home right away and do your usual activities.  · You may be instructed to drink a tall glass of water every 30 minutes for the first 2 hours you are home.  · Taking a warm bath or using a warm compress may relieve any discomfort near your urethra.  Let your health care provider know if you have:  · Pain.  · Blood in your urine.  · Chills.  · Fever.  WHAT DO MY TEST RESULTS MEAN?  Discuss the  results of your urodynamic tests with your health care provider. Your health care provider will use the results of these and other tests, along with your signs and symptoms, to make a diagnosis. Some common causes for abnormal results from urodynamic tests include:  · Enlarged prostate in men.  · Overactive bladder.  · Urinary tract infection.  · Nervous system diseases.  · Spinal cord damage.     This information is not intended to replace advice given to you by your health care provider. Make sure you discuss any questions you have with your health care provider.     Document Released: 05/03/2007 Document Revised: 07/27/2014 Document Reviewed: 10/16/2013  Elsevier Interactive Patient Education ©2016 [employer]

## 2015-08-09 NOTE — Progress Notes (Signed)
5:30 PM  08/09/2015   [name] [name] [birth year] [id number]  Referring provider: Dion Body, MD [place] [facility] [place], [place] [address]  Chief Complaint  Patient presents with  . Benign Prostatic Hypertrophy    100month   HPI: 82year old male with BPH, h/o gross hematuria s/p negative work up 12/2015, incidental enhancing 1.2 cm upper pole renal mass s/p cryrotherapy , and h/o nephrolithiasis.  He returns today for routine follow up.   BPH/ nocturia: He currently takes Flomax and finasteride.  He continues to have significant urinary symptoms primarily urinary urgency, frequency, and episodes of urge incontinence. He also has severe nocturia x4-5. He does report a weak urinary stream but does feel as though he is able to empty his bladder completely.    He does have a history of urinary retention many years ago following bile duct surgery requiring Foley x 3 months.    Cysto 11/2014 showed trabeculated bladder with significantly enlarged prostate, median lobe.  TRUS vol 76 g at that time.    PVR today 88.    He does have a history of stroke in 2015.        IPSS      08/09/15 1600       International Prostate Symptom Score   How often have you had the sensation of not emptying your bladder? More than half the time     How often have you had to urinate less than every two hours? About half the time     How often have you found you stopped and started again several times when you urinated? Almost always     How often have you found it difficult to postpone urination? Less than half the time     How often have you had a weak urinary stream? Less than half the time     How often have you had to strain to start urination? Not at All     How many times did you typically get up at night to urinate? 5 Times     Total IPSS Score 21     Quality of Life due to urinary symptoms   If you were to spend the rest of your life with your urinary condition  just the way it is now how would you feel about that? Mostly Disatisfied         Hematuria: S/p work up in 12/2014 including CT Urogram/ cystoscopy  Renal mass: 1.2 cm left upper pole renal mass s/p cryotherapy on 02/2015 by IR.  Tolerated procedure well.  Due for imaging next month, followed by IR.     PMH: Past Medical History  Diagnosis Date  . Hypertension   [place]  . Enlarged prostate 11/21/14  [place]   . [place] [id number]   slight weakness RT side  . Arthritis   . GERD (gastroesophageal reflux disease)   . History of kidney stones     Surgical History: Past Surgical History  Procedure Laterality Date  . Cholecystectomy N/A   . Joint replacement Bilateral 4-5 years ago    kness replaced   . Skin cancer excision  1973  . Laparotomy  2004    1 week after choley "kinicked bile duct"    Home Medications:    Medication List       This list is accurate as of: 08/09/15  5:30 PM.  Always use your  most recent med list.               amLODipine 5 MG tablet  Commonly known as:  NORVASC  Take 5 mg by mouth every morning.     aspirin 81 MG tablet  Take 81 mg by mouth every other day.     atorvastatin 40 MG tablet  Commonly known as:  LIPITOR  Take 40 mg by mouth daily.     beta carotene 25000 UNIT capsule  Take 25,000 Units by mouth daily.     finasteride 5 MG tablet  Commonly known as:  PROSCAR  Take 5 mg by mouth daily.     hydrochlorothiazide 25 MG tablet  Commonly known as:  HYDRODIURIL  Take 25 mg by mouth every morning.     lisinopril 40 MG tablet  Commonly known as:  PRINIVIL,ZESTRIL  Take 40 mg by mouth every morning.     metoprolol 50 MG tablet  Commonly known as:  LOPRESSOR  Take 50 mg by mouth 2 (two) times daily.     potassium chloride SA 20 MEQ tablet  Commonly known as:  K-DUR,KLOR-CON  Take 20 mEq by mouth 2 (two) times daily.     tamsulosin 0.4 MG Caps capsule  Commonly known as:   FLOMAX  Take 0.4 mg by mouth.     zinc gluconate 50 MG tablet  Take 50 mg by mouth daily.        Allergies: No Known Allergies  Family History: Family History  Problem Relation Age of Onset  . Cancer Brother   . Cancer Sister   . Cancer Sister   . Cancer - Prostate Brother     Social History:  reports that he quit smoking about 46 years ago. His smoking use included Cigarettes. He does not have any smokeless tobacco history on file. He reports that he does not drink alcohol or use illicit drugs.   Physical Exam: BP 170/83 mmHg  Pulse 62  Ht '5\' 8"'$  (1.727 m)  Wt 222 lb 8 oz (100.925 kg)  BMI 33.84 kg/m2  Constitutional:  Alert and oriented, No acute distress.  Presents with this son today. HEENT: [place] AT, moist mucus membranes.  Trachea midline, no masses. Cardiovascular: No clubbing, cyanosis, or edema. Respiratory: Normal respiratory effort, no increased work of breathing. Rectal: Normal external sphincter.   Skin: No rashes, bruises or suspicious lesions. Psychiatric: Normal mood and affect.  Laboratory Data: Lab Results  Component Value Date   WBC 8.9 03/16/2015   HGB 13.0 03/16/2015   HCT 39.1 03/16/2015   MCV 85.7 03/16/2015   PLT 202 03/16/2015    Lab Results  Component Value Date   CREATININE 0.73 03/16/2015    UA Results for orders placed or performed in visit on 08/09/15  Microscopic Examination  Result Value Ref Range   WBC, UA 11-30 (A) 0 -  5 /hpf   RBC, UA 3-10 (A) 0 -  2 /hpf   Epithelial Cells (non renal) 0-10 0 - 10 /hpf   Mucus, UA Present (A) Not Estab.   Bacteria, UA Many (A) None seen/Few  Urinalysis, Complete  Result Value Ref Range   Specific Gravity, UA 1.020 1.005 - 1.030   pH, UA 6.0 5.0 - 7.5   Color, UA Yellow Yellow   Appearance Ur Clear Clear   Leukocytes, UA 1+ (A) Negative   Protein, UA 1+ (A) Negative/Trace   Glucose, UA Negative Negative   Ketones, UA Trace (A) Negative  RBC, UA 2+ (A) Negative   Bilirubin, UA  Negative Negative   Urobilinogen, Ur 0.2 0.2 - 1.0 mg/dL   Nitrite, UA Negative Negative   Microscopic Examination See below:   BLADDER SCAN AMB NON-IMAGING  Result Value Ref Range   Scan Result 9m       Assessment & Plan:    1. BPH with obstruction/lower urinary tract symptoms  Both obstructive and irritative voiding symptoms  Currently on maximal medical therapy with finasteride and Flomax.  Significantly enlarged prostate, median lobe.  TRUS vol 76.    Prior to proceeding with an outlet surgery, I would like to obtain urodynamics to confirm the presence of bladder outlet obstruction and ensure that he would truly benefit from an outlet procedure. He is agreeable with this plan and is very cautious about undergoing any surgical procedure requiring general anesthesia at his age. - referral to urodynamics at [facility] and return to discuss results - if BOO present, will consider HoLEP -UCx today (UA mildly suspicious for UTI)  2. Nocturia As above.  Improved from previous with behavioral modification.  3. Left renal mass S/p L cryo 02/2015 F/u repeat imaging in 08/2015  [facility] f[facility]u with Dr. BErlene Quan  AHollice Espy MD  [facility] [address] [place] [phone]

## 2015-08-11 LAB — CULTURE, URINE COMPREHENSIVE

## 2015-08-12 ENCOUNTER — Telehealth: Payer: Self-pay | Admitting: Urology

## 2015-08-12 MED ORDER — NITROFURANTOIN MONOHYD MACRO 100 MG PO CAPS: 100.0000 mg | ORAL_CAPSULE | Freq: Two times a day (BID) | ORAL | Status: DC

## 2015-08-12 NOTE — Telephone Encounter (Signed)
Patient has positive urine culture which probably should be treated prior to urodynamics.  Macrobid and sent to [facility].  Please call and make patient aware.  Hollice Espy, MD

## 2015-08-13 NOTE — Telephone Encounter (Signed)
Spoke with pt in reference +ucx. Made aware medication has already been sent to pharmacy. Pt voiced understanding.

## 2015-08-28 ENCOUNTER — Other Ambulatory Visit
Admission: RE | Admit: 2015-08-28 | Discharge: 2015-08-28 | Disposition: A | Discharge status: Home / Self Care | Payer: Medicare Other | Source: Ambulatory Visit | Attending: Interventional Radiology | Admitting: Interventional Radiology

## 2015-08-28 DIAGNOSIS — C649 Malignant neoplasm of unspecified kidney, except renal pelvis: Secondary | ICD-10-CM | POA: Insufficient documentation

## 2015-08-28 LAB — BUN: BUN: 19 mg/dL (ref 6–20)

## 2015-08-28 LAB — CREATININE, SERUM
CREATININE: 0.82 mg/dL (ref 0.61–1.24)
GFR calc Af Amer: >60 mL/min (ref >60)
GFR calc non Af Amer: >60 mL/min (ref >60)

## 2015-08-30 ENCOUNTER — Ambulatory Visit (INDEPENDENT_AMBULATORY_CARE_PROVIDER_SITE_OTHER): Payer: Medicare Other | Admitting: Urology

## 2015-08-30 ENCOUNTER — Ambulatory Visit
Admission: RE | Admit: 2015-08-30 | Discharge: 2015-08-30 | Disposition: A | Discharge status: Home / Self Care | Payer: Medicare Other | Source: Ambulatory Visit | Attending: Interventional Radiology | Admitting: Interventional Radiology

## 2015-08-30 ENCOUNTER — Telehealth: Payer: Self-pay | Admitting: Radiology

## 2015-08-30 ENCOUNTER — Ambulatory Visit: Payer: Medicare Other

## 2015-08-30 VITALS — BP 131/79 | HR 68 | Ht 68.0 in | Wt 223.3 lb

## 2015-08-30 DIAGNOSIS — N401 Enlarged prostate with lower urinary tract symptoms: Secondary | ICD-10-CM | POA: Diagnosis not present

## 2015-08-30 DIAGNOSIS — I7090 Unspecified atherosclerosis: Secondary | ICD-10-CM | POA: Insufficient documentation

## 2015-08-30 DIAGNOSIS — R911 Solitary pulmonary nodule: Secondary | ICD-10-CM | POA: Insufficient documentation

## 2015-08-30 DIAGNOSIS — R351 Nocturia: Secondary | ICD-10-CM

## 2015-08-30 DIAGNOSIS — N2889 Other specified disorders of kidney and ureter: Secondary | ICD-10-CM | POA: Diagnosis not present

## 2015-08-30 DIAGNOSIS — N138 Other obstructive and reflux uropathy: Secondary | ICD-10-CM

## 2015-08-30 LAB — POCT I-STAT CREATININE: Creatinine, Ser: 0.7 mg/dL (ref 0.61–1.24)

## 2015-08-30 MED ORDER — IOHEXOL 350 MG/ML SOLN
100.0000 mL | Freq: Once | INTRAVENOUS | Status: AC | PRN
  Administered 2015-08-30: 100 mL via INTRAVENOUS

## 2015-08-30 NOTE — Progress Notes (Signed)
10:24 AM  08/30/2015   [name] 03-10-1932 [id number]  Referring provider: Dion Body, MD [facility] [place], [place] [address]  Chief Complaint  Patient presents with  . Results    UDS results    HPI: 82 year old male with BPH, h/o gross hematuria s/p negative work up 12/2015, incidental enhancing 1.2 cm upper pole renal mass s/p cryrotherapy , and h/o nephrolithiasis.  He returns today for routine follow up.   BPH/ nocturia: He currently takes Flomax and finasteride.  He continues to have significant urinary symptoms primarily urinary urgency, frequency, and episodes of urge incontinence. He also has severe nocturia x4-5 which seems to be improving recently to 1-2x. He does report a weak urinary stream but does feel as though he is able to empty his bladder completely.    He does have a history of urinary retention many years ago following bile duct surgery requiring Foley x 3 months.    Cysto 11/2014 showed trabeculated bladder with significantly enlarged prostate, median lobe.  TRUS vol 76 g at that time.    He does have a history of stroke in 2015.    He returns today after having a urodynamic study. This indicated that he was clearly obstructed with high bladder pressures and low flow (peak flow 7 ml/s, pDET at max flow 90 cm H20 consistent with obstructed voiding pattern).  He also had relative small bladder capacity, 150 mL with low amplitude instability but unable to feel these contractions.  His first sensation was accompanied by strong desire and with low reaction time and resulting in unstable contraction leading to urination.   Bladder was trabeculated with possible right reflux.  PVR 40.          IPSS      08/09/15 1600       International Prostate Symptom Score   How often have you had the sensation of not emptying your bladder? More than half the time     How often have you had to urinate less than every two hours?  About half the time     How often have you found you stopped and started again several times when you urinated? Almost always     How often have you found it difficult to postpone urination? Less than half the time     How often have you had a weak urinary stream? Less than half the time     How often have you had to strain to start urination? Not at All     How many times did you typically get up at night to urinate? 5 Times     Total IPSS Score 21     Quality of Life due to urinary symptoms   If you were to spend the rest of your life with your urinary condition just the way it is now how would you feel about that? Mostly Disatisfied         Hematuria: S/p work up in 12/2014 including CT Urogram/ cystoscopy  Renal mass: 1.2 cm left upper pole renal mass s/p cryotherapy on 02/2015 by IR.  Tolerated procedure well.  Following up CT scan today.    PMH: Past Medical History  Diagnosis Date  . Hypertension   . Cancer ([place]) 45 years ago melanoma  . Enlarged prostate 11/21/14  . Renal cancer ([place])   . Stroke ([place]) [id number]    slight weakness RT side  . Arthritis   . GERD (  gastroesophageal reflux disease)   . History of kidney stones     Surgical History: Past Surgical History  Procedure Laterality Date  . Cholecystectomy N/A   . Joint replacement Bilateral 4-5 years ago    kness replaced   . Skin cancer excision  1973  . Laparotomy  2004    1 week after choley "kinicked bile duct"    Home Medications:    Medication List       This list is accurate as of: 08/30/15 10:24 AM.  Always use your most recent med list.               amLODipine 5 MG tablet  Commonly known as:  NORVASC  Take 5 mg by mouth every morning.     aspirin 81 MG tablet  Take 81 mg by mouth every other day.     atorvastatin 40 MG tablet  Commonly known as:  LIPITOR  Take 40 mg by mouth daily.     beta carotene 25000 UNIT capsule  Take 25,000 Units by mouth daily.     finasteride 5 MG tablet    Commonly known as:  PROSCAR  Take 5 mg by mouth daily.     hydrochlorothiazide 25 MG tablet  Commonly known as:  HYDRODIURIL  Take 25 mg by mouth every morning.     lisinopril 40 MG tablet  Commonly known as:  PRINIVIL,ZESTRIL  Take 40 mg by mouth every morning.     metoprolol 50 MG tablet  Commonly known as:  LOPRESSOR  Take 50 mg by mouth 2 (two) times daily.     potassium chloride SA 20 MEQ tablet  Commonly known as:  K-DUR,KLOR-CON  Take 20 mEq by mouth 2 (two) times daily.     tamsulosin 0.4 MG Caps capsule  Commonly known as:  FLOMAX  Take 0.4 mg by mouth.     zinc gluconate 50 MG tablet  Take 50 mg by mouth daily.        Allergies: No Known Allergies  Family History: Family History  Problem Relation Age of Onset  . Cancer Brother   . Cancer Sister   . Cancer Sister   . Cancer - Prostate Brother     Social History:  reports that he quit smoking about 46 years ago. His smoking use included Cigarettes. He does not have any smokeless tobacco history on file. He reports that he does not drink alcohol or use illicit drugs.   Physical Exam: BP 131/79 mmHg  Pulse 68  Ht '5\' 8"'$  (1.727 m)  Wt 223 lb 4.8 oz (101.288 kg)  BMI 33.96 kg/m2  Constitutional:  Alert and oriented, No acute distress.  Presents with this son today. HEENT: [place] AT, moist mucus membranes.  Trachea midline, no masses. Cardiovascular: No clubbing, cyanosis, or edema. CTAB. Respiratory: Normal respiratory effort, no increased work of breathing. RRR. Rectal: Normal external sphincter.   Skin: No rashes, bruises or suspicious lesions. Psychiatric: Normal mood and affect.  Laboratory Data: Lab Results  Component Value Date   WBC 8.9 03/16/2015   HGB 13.0 03/16/2015   HCT 39.1 03/16/2015   MCV 85.7 03/16/2015   PLT 202 03/16/2015    Lab Results  Component Value Date   CREATININE 0.70 08/30/2015    UA Results for orders placed or performed during the hospital encounter of 08/30/15   I-STAT creatinine  Result Value Ref Range   Creatinine, Ser 0.70 0.61 - 1.24 mg/dL      Assessment &  Plan:    1. BPH with obstruction/lower urinary tract symptoms  Both obstructive and irritative voiding symptoms  Currently on maximal medical therapy with finasteride and Flomax.  Significantly enlarged prostate, median lobe.  TRUS vol 76.    BOOI consistent with obstruction with some instability and small capacity bladder.    Discussed alternatives today including conservative mannagement vs. Surgical intervention.  He is high risk for recurrent episodes of retention without intervention.      Risks of surgery including risk of bleeding, infection, damaged to surrounding structures, need for Foley catheter, worsened urinary incontinence, frequency, and irritative voiding symptom discussed in detail.  Incontinence is highly likely at least in the beginning in the setting of small bladder capacity and bladder instability demonstrated on UDS.    We discussed that there will be a need for bladder rehabilitation following surgery.    He and his son agree that they would like to precede with HoLEP.  Procedure and alterative outlet surgeries also offered and discussed.    All questions answered.    2. Nocturia As above.  Improved from previous with behavioral modification.  3. Left renal mass S/p L cryo 02/2015 Repeat imaging 08/30/15 NED  Schedule HoLEP   Hollice Espy, MD  [facility] [address] [place], [place] [address] [phone]

## 2015-08-30 NOTE — Telephone Encounter (Signed)
Notified pt's son, Jonni Sanger, of surgery scheduled 09/30/15, pre-admit testing appt on 3/3 '@9'$ :00 and to call Friday prior to surgery for arrival time to SDS. Varney Daily that pt may continue taking ASA '81mg'$  per Dr Cherrie Gauze instruction. Jonni Sanger voices understanding.

## 2015-09-03 ENCOUNTER — Ambulatory Visit
Admission: RE | Admit: 2015-09-03 | Discharge: 2015-09-03 | Disposition: A | Discharge status: Home / Self Care | Payer: Medicare Other | Source: Ambulatory Visit | Attending: Interventional Radiology | Admitting: Interventional Radiology

## 2015-09-03 DIAGNOSIS — N2889 Other specified disorders of kidney and ureter: Secondary | ICD-10-CM

## 2015-09-03 NOTE — Progress Notes (Signed)
Patient ID: [name] [name], male   DOB: 27-Feb-1932, 81 y.o.   MRN: [id number]    Chief Complaint: Left renal cell carcinoma. Status post cryoablation.  Referring Physician(s): Hollice Espy  History of Present Illness: [name] [name] is a 82 y.o. male who is 6 months status post left renal cell carcinoma cryoablation. He is doing very well. He remains a symptomatically. No current abdominal pain, flank pain, fever or hematuria. He is at his baseline physically. He returns for outpatient follow-up and to review his most recent surveillance imaging.   Past Medical History  Diagnosis Date  . Hypertension   . Cancer ([place]) 45 years ago melanoma  . Enlarged prostate 11/21/14  . Renal cancer ([place])   . Stroke ([place]) [id number]    slight weakness RT side  . Arthritis   . GERD (gastroesophageal reflux disease)   . History of kidney stones     Past Surgical History  Procedure Laterality Date  . Cholecystectomy N/A   . Joint replacement Bilateral 4-5 years ago    kness replaced   . Skin cancer excision  1973  . Laparotomy  2004    1 week after choley "kinicked bile duct"    Allergies: Review of patient's allergies indicates no known allergies.  Medications: Prior to Admission medications   Medication Sig Start Date End Date Taking? Authorizing Provider  amLODipine (NORVASC) 5 MG tablet Take 5 mg by mouth every morning.    Yes Historical Provider, MD  aspirin 81 MG tablet Take 81 mg by mouth every other day.    Yes Historical Provider, MD  atorvastatin (LIPITOR) 40 MG tablet Take 40 mg by mouth daily.   Yes Historical Provider, MD  beta carotene 25000 UNIT capsule Take 25,000 Units by mouth daily.   Yes Historical Provider, MD  finasteride (PROSCAR) 5 MG tablet Take 5 mg by mouth daily.   Yes Historical Provider, MD  hydrochlorothiazide (HYDRODIURIL) 25 MG tablet Take 25 mg by mouth every morning.    Yes Historical Provider, MD  lisinopril (PRINIVIL,ZESTRIL) 40 MG tablet Take 40 mg by mouth  every morning.    Yes Historical Provider, MD  metoprolol (LOPRESSOR) 50 MG tablet Take 50 mg by mouth 2 (two) times daily.   Yes Historical Provider, MD  potassium chloride SA (K-DUR,KLOR-CON) 20 MEQ tablet Take 20 mEq by mouth 2 (two) times daily.   Yes Historical Provider, MD  tamsulosin (FLOMAX) 0.4 MG CAPS capsule Take 0.4 mg by mouth.   Yes Historical Provider, MD  zinc gluconate 50 MG tablet Take 50 mg by mouth daily.    Historical Provider, MD     Family History  Problem Relation Age of Onset  . Cancer Brother   . Cancer Sister   . Cancer Sister   . Cancer - Prostate Brother     Social History   Social History  . Marital Status: Single    Spouse Name: N/A  . Number of Children: N/A  . Years of Education: N/A   Social History Main Topics  . Smoking status: Former Smoker    Types: Cigarettes    Quit date: 11/20/1968  . Smokeless tobacco: Not on file  . Alcohol Use: No  . Drug Use: No  . Sexual Activity: Not on file   Other Topics Concern  . Not on file   Social History Narrative    ECOG Status: 0 - Asymptomatic  Review of Systems: A 12 point ROS discussed and pertinent positives are indicated  in the HPI above.  All other systems are negative.  Review of Systems  Vital Signs: BP 168/84 mmHg  Pulse 66  Temp(Src) 98.3 F (36.8 C) (Oral)  Resp 97  Ht '5\' 8"'$  (1.727 m)  Wt 220 lb (99.791 kg)  BMI 33.46 kg/m2  SpO2 97%  Physical Exam  Constitutional: He appears well-developed and well-nourished. No distress.  Cardiovascular: Normal rate and regular rhythm.  Exam reveals no friction rub.   No murmur heard. Pulmonary/Chest: Effort normal and breath sounds normal. No respiratory distress.  Abdominal: Soft. He exhibits no distension and no mass. There is no tenderness.  Skin: He is not diaphoretic.    Imaging: Ct Abd Wo & W Cm  08/30/2015  CLINICAL DATA:  Status post cryotherapy of a left upper pole renal mass. Prior cholecystectomy and laparotomy. EXAM:  CT ABDOMEN WITHOUT AND WITH CONTRAST TECHNIQUE: Multidetector CT imaging of the abdomen was performed following the standard protocol before and following the bolus administration of intravenous contrast. CONTRAST:  141m OMNIPAQUE IOHEXOL 350 MG/ML SOLN COMPARISON:  Procedure of 03/15/2015 and the MRI of 01/10/2015. FINDINGS: Lower chest: 271mright middle lobe pulmonary nodule on image 6/series 3. This area was not included on the prior abdominal CT. Mild cardiomegaly with multivessel coronary artery atherosclerosis. Small hiatal hernia. Hepatobiliary: Scattered hepatic cysts. A subcentimeter focus of right hepatic lobe arterial hyper enhancement on image 53/series 4 is likely a perfusion anomaly. Cholecystectomy, without biliary ductal dilatation. Pancreas: Normal, without mass or ductal dilatation. Spleen: Hypervascular foci within the spleen measure maximally 1.6 cm. These were present on the prior MRI and are favored to represent hemangiomas. Adrenals/Urinary Tract: Right adrenal calcifications are likely related to remote hemorrhage or infection. Normal left adrenal gland. No renal calculi or hydronephrosis. Lower pole minimally complex left renal cyst again identified, including at 1.6 cm. The ablation site is identified at the anterior aspect of the upper pole left kidney, including on image 87/series 6. No evidence of residual or locally recurrent disease. Other too small to characterize lesions are identified throughout the left kidney, without suspicious or dominant lesion. Stomach/Bowel: Normal remainder of the stomach. Normal abdominal portions of the terminal ileum and remainder of small bowel. Normal abdominal portions of the colon. Vascular/Lymphatic: Advanced aortic and branch vessel atherosclerosis. Patent left renal vein. No retroperitoneal or retrocrural adenopathy. Other: No ascites. Musculoskeletal: No acute osseous abnormality. lumbar spondylosis and degenerative disc disease. IMPRESSION: 1.  Interval ablation of an upper pole left renal lesion, without locally recurrent or metastatic disease. 2. Advanced atherosclerosis. 3. Hypervascular splenic lesions are technically indeterminate but favored to represent benign entities such as hemangiomas. Similar to the prior MRI, given cross modality comparison. 4. A tiny right middle lobe pulmonary nodule has not been imaged previously. Recommend attention on follow-up. Electronically Signed   By: KyAbigail Miyamoto.D.   On: 08/30/2015 10:40    Labs:  CBC:  Recent Labs  03/12/15 1457 03/16/15 0404  WBC 9.0 8.9  HGB 13.5 13.0  HCT 41.6 39.1  PLT 196 202    COAGS:  Recent Labs  03/12/15 1457  INR 1.03  APTT 31    BMP:  Recent Labs  03/12/15 1457 03/16/15 0404 08/28/15 0918 08/30/15 0942  NA 141 138  --   --   K 3.1* 2.9*  --   --   CL 102 104  --   --   CO2 31 28  --   --   GLUCOSE 113* 101*  --   --  BUN '19 16 19  '$ --   CALCIUM 8.8* 8.1*  --   --   CREATININE 0.87 0.73 0.82 0.70  GFRNONAA >60 >60 >60  --   GFRAA >60 >60 >60  --     LIVER FUNCTION TESTS: No results for input(s): BILITOT, AST, ALT, ALKPHOS, PROT, ALBUMIN in the last 8760 hours.  TUMOR MARKERS: No results for input(s): AFPTM, CEA, CA199, CHROMGRNA in the last 8760 hours.  Assessment and Plan:  6 months status post cryoablation of a 1.2 cm left upper pole renal cell carcinoma. 6 months surveillance CT imaging demonstrates no residual or recurrent tumor. There is regression of the ablation site. Overall he is doing very well and asymptomatic.  Plan: Repeat surveillance imaging at 12 months with an outpatient visit.  Electronically Signed: Greggory Keen 09/03/2015, 3:31 PM   I spent a total of    25 Minutes in face to face in clinical consultation, greater than 50% of which was counseling/coordinating care for status post ablation of a left renal cell carcinoma.

## 2015-09-20 ENCOUNTER — Encounter
Admission: RE | Admit: 2015-09-20 | Discharge: 2015-09-20 | Disposition: A | Discharge status: Home / Self Care | Payer: Medicare Other | Source: Ambulatory Visit | Attending: Urology | Admitting: Urology

## 2015-09-20 DIAGNOSIS — Z01810 Encounter for preprocedural cardiovascular examination: Secondary | ICD-10-CM | POA: Diagnosis not present

## 2015-09-20 DIAGNOSIS — Z01812 Encounter for preprocedural laboratory examination: Secondary | ICD-10-CM | POA: Insufficient documentation

## 2015-09-20 LAB — CBC
HEMATOCRIT: 41.9 % (ref 40.0–52.0)
HEMOGLOBIN: 14 g/dL (ref 13.0–18.0)
MCH: 27.6 pg (ref 26.0–34.0)
MCHC: 33.5 g/dL (ref 32.0–36.0)
MCV: 82.2 fL (ref 80.0–100.0)
Platelets: 189 10*3/uL (ref 150–440)
RBC: 5.09 MIL/uL (ref 4.40–5.90)
RDW: 13.6 % (ref 11.5–14.5)
WBC: 8.2 10*3/uL (ref 3.8–10.6)

## 2015-09-20 NOTE — Patient Instructions (Signed)
  Your procedure is scheduled on: 09/30/15 Report to Day Surgery. To find out your arrival time please call [phone] between 1PM - 3PM on 09/27/15.  Remember: Instructions that are not followed completely may result in serious medical risk, up to and including death, or upon the discretion of your surgeon and anesthesiologist your surgery may need to be rescheduled.    _x___ 1. Do not eat food or drink liquids after midnight. No gum chewing or hard candies.     __x__ 2. No Alcohol for 24 hours before or after surgery.   ____ 3. Bring all medications with you on the day of surgery if instructed.    _x___ 4. Notify your doctor if there is any change in your medical condition     (cold, fever, infections).     Do not wear jewelry, make-up, hairpins, clips or nail polish.  Do not wear lotions, powders, or perfumes. You may wear deodorant.  Do not shave 48 hours prior to surgery. Men may shave face and neck.  Do not bring valuables to the hospital.    [facility] is not responsible for any belongings or valuables.               Contacts, dentures or bridgework may not be worn into surgery.  Leave your suitcase in the car. After surgery it may be brought to your room.  For patients admitted to the hospital, discharge time is determined by your                treatment team.   Patients discharged the day of surgery will not be allowed to drive home.   Please read over the following fact sheets that you were given:   Surgical Site Infection Prevention   ____ Take these medicines the morning of surgery with A SIP OF WATER:    1. amlodipine  2. proscar  3. lisinopril  4.metoprolol  5.antacid the night before and morning of surgery  6.  ____ Fleet Enema (as directed)   ____ Use CHG Soap as directed  ____ Use inhalers on the day of surgery  ____ Stop metformin 2 days prior to surgery    ____ Take 1/2 of usual insulin dose the night before surgery and none on the morning of  surgery.   _x__ Continue aspirin 81 mg per Dr. Erlene Quan   __x__ Stop Anti-inflammatories on tylenol only after 3/5   __x__ Stop supplements until after surgery on 3/5  ____ Bring C-Pap to the hospital.

## 2015-09-30 ENCOUNTER — Encounter
Admission: RE | Disposition: A | Discharge status: Home / Self Care | Payer: Self-pay | Source: Ambulatory Visit | Attending: Urology

## 2015-09-30 ENCOUNTER — Ambulatory Visit: Payer: Medicare Other | Admitting: Certified Registered"

## 2015-09-30 ENCOUNTER — Ambulatory Visit
Admission: RE | Admit: 2015-09-30 | Discharge: 2015-09-30 | Disposition: A | Discharge status: Home / Self Care | Payer: Medicare Other | Source: Ambulatory Visit | Attending: Urology | Admitting: Urology

## 2015-09-30 ENCOUNTER — Encounter: Payer: Self-pay | Admitting: Urology

## 2015-09-30 DIAGNOSIS — Z8042 Family history of malignant neoplasm of prostate: Secondary | ICD-10-CM | POA: Diagnosis not present

## 2015-09-30 DIAGNOSIS — N138 Other obstructive and reflux uropathy: Secondary | ICD-10-CM | POA: Insufficient documentation

## 2015-09-30 DIAGNOSIS — N210 Calculus in bladder: Secondary | ICD-10-CM

## 2015-09-30 DIAGNOSIS — Z809 Family history of malignant neoplasm, unspecified: Secondary | ICD-10-CM | POA: Diagnosis not present

## 2015-09-30 DIAGNOSIS — I10 Essential (primary) hypertension: Secondary | ICD-10-CM | POA: Diagnosis not present

## 2015-09-30 DIAGNOSIS — N2889 Other specified disorders of kidney and ureter: Secondary | ICD-10-CM | POA: Diagnosis not present

## 2015-09-30 DIAGNOSIS — Z87891 Personal history of nicotine dependence: Secondary | ICD-10-CM | POA: Insufficient documentation

## 2015-09-30 DIAGNOSIS — R3915 Urgency of urination: Secondary | ICD-10-CM | POA: Diagnosis not present

## 2015-09-30 DIAGNOSIS — R310 Gross hematuria: Secondary | ICD-10-CM | POA: Insufficient documentation

## 2015-09-30 DIAGNOSIS — M1990 Unspecified osteoarthritis, unspecified site: Secondary | ICD-10-CM | POA: Diagnosis not present

## 2015-09-30 DIAGNOSIS — N3289 Other specified disorders of bladder: Secondary | ICD-10-CM | POA: Diagnosis not present

## 2015-09-30 DIAGNOSIS — N401 Enlarged prostate with lower urinary tract symptoms: Secondary | ICD-10-CM

## 2015-09-30 DIAGNOSIS — Z85528 Personal history of other malignant neoplasm of kidney: Secondary | ICD-10-CM | POA: Insufficient documentation

## 2015-09-30 DIAGNOSIS — Z87442 Personal history of urinary calculi: Secondary | ICD-10-CM | POA: Insufficient documentation

## 2015-09-30 DIAGNOSIS — Z7982 Long term (current) use of aspirin: Secondary | ICD-10-CM | POA: Diagnosis not present

## 2015-09-30 DIAGNOSIS — Z8673 Personal history of transient ischemic attack (TIA), and cerebral infarction without residual deficits: Secondary | ICD-10-CM | POA: Diagnosis not present

## 2015-09-30 DIAGNOSIS — Z79899 Other long term (current) drug therapy: Secondary | ICD-10-CM | POA: Insufficient documentation

## 2015-09-30 DIAGNOSIS — Z96653 Presence of artificial knee joint, bilateral: Secondary | ICD-10-CM | POA: Diagnosis not present

## 2015-09-30 DIAGNOSIS — R350 Frequency of micturition: Secondary | ICD-10-CM | POA: Diagnosis not present

## 2015-09-30 DIAGNOSIS — Z9049 Acquired absence of other specified parts of digestive tract: Secondary | ICD-10-CM | POA: Insufficient documentation

## 2015-09-30 DIAGNOSIS — R351 Nocturia: Secondary | ICD-10-CM | POA: Diagnosis not present

## 2015-09-30 DIAGNOSIS — K219 Gastro-esophageal reflux disease without esophagitis: Secondary | ICD-10-CM | POA: Insufficient documentation

## 2015-09-30 DIAGNOSIS — Z85820 Personal history of malignant melanoma of skin: Secondary | ICD-10-CM | POA: Diagnosis not present

## 2015-09-30 HISTORY — PX: HOLEP-LASER ENUCLEATION OF THE PROSTATE WITH MORCELLATION: SHX6641

## 2015-09-30 HISTORY — PX: CYSTOSCOPY WITH LITHOLAPAXY: SHX1425

## 2015-09-30 LAB — POCT I-STAT 4, (NA,K, GLUC, HGB,HCT)
Glucose, Bld: 97 mg/dL (ref 65–99)
HEMATOCRIT: 46 % (ref 39.0–52.0)
HEMOGLOBIN: 15.6 g/dL (ref 13.0–17.0)
Potassium: 3 mmol/L — ABNORMAL LOW (ref 3.5–5.1)
Sodium: 141 mmol/L (ref 135–145)

## 2015-09-30 SURGERY — ENUCLEATION, PROSTATE, USING LASER, WITH MORCELLATION
Anesthesia: General | Wound class: Clean Contaminated

## 2015-09-30 MED ORDER — ONDANSETRON HCL 4 MG/2ML IJ SOLN: 4.0000 mg | Freq: Once | INTRAMUSCULAR | Status: DC | PRN

## 2015-09-30 MED ORDER — ONDANSETRON HCL 4 MG/2ML IJ SOLN
INTRAMUSCULAR | Status: DC | PRN
  Administered 2015-09-30: 4 mg via INTRAVENOUS

## 2015-09-30 MED ORDER — DOCUSATE SODIUM 100 MG PO CAPS: 100.0000 mg | ORAL_CAPSULE | Freq: Two times a day (BID) | ORAL | Status: DC | PRN

## 2015-09-30 MED ORDER — MIRABEGRON ER 25 MG PO TB24: 25.0000 mg | ORAL_TABLET | Freq: Every day | ORAL | Status: DC

## 2015-09-30 MED ORDER — LACTATED RINGERS IV SOLN
INTRAVENOUS | Status: DC
  Administered 2015-09-30 (×2): via INTRAVENOUS

## 2015-09-30 MED ORDER — CEFAZOLIN SODIUM-DEXTROSE 2-3 GM-% IV SOLR
INTRAVENOUS | Status: AC
  Filled 2015-09-30: qty 50

## 2015-09-30 MED ORDER — PROPOFOL 10 MG/ML IV BOLUS
INTRAVENOUS | Status: DC | PRN
  Administered 2015-09-30: 40 mg via INTRAVENOUS
  Administered 2015-09-30: 140 mg via INTRAVENOUS
  Administered 2015-09-30: 10 mg via INTRAVENOUS

## 2015-09-30 MED ORDER — EPHEDRINE SULFATE 50 MG/ML IJ SOLN
INTRAMUSCULAR | Status: DC | PRN
  Administered 2015-09-30: 10 mg via INTRAVENOUS
  Administered 2015-09-30: 5 mg via INTRAVENOUS
  Administered 2015-09-30 (×3): 10 mg via INTRAVENOUS
  Administered 2015-09-30: 5 mg via INTRAVENOUS

## 2015-09-30 MED ORDER — GENTAMICIN IN SALINE 1.6-0.9 MG/ML-% IV SOLN
80.0000 mg | Freq: Once | INTRAVENOUS | Status: AC
  Administered 2015-09-30: 80 mg via INTRAVENOUS
  Filled 2015-09-30: qty 50

## 2015-09-30 MED ORDER — CEFAZOLIN SODIUM-DEXTROSE 2-3 GM-% IV SOLR
2.0000 g | Freq: Once | INTRAVENOUS | Status: AC
  Administered 2015-09-30: 2 g via INTRAVENOUS

## 2015-09-30 MED ORDER — LIDOCAINE HCL (CARDIAC) 20 MG/ML IV SOLN
INTRAVENOUS | Status: DC | PRN
  Administered 2015-09-30: 100 mg via INTRAVENOUS

## 2015-09-30 MED ORDER — ROCURONIUM BROMIDE 100 MG/10ML IV SOLN
INTRAVENOUS | Status: DC | PRN
  Administered 2015-09-30: 5 mg via INTRAVENOUS
  Administered 2015-09-30: 20 mg via INTRAVENOUS
  Administered 2015-09-30 (×2): 5 mg via INTRAVENOUS
  Administered 2015-09-30 (×2): 10 mg via INTRAVENOUS
  Administered 2015-09-30: 5 mg via INTRAVENOUS

## 2015-09-30 MED ORDER — MIDAZOLAM HCL 2 MG/2ML IJ SOLN
INTRAMUSCULAR | Status: DC | PRN
Start: 2015-09-30 — End: 2015-09-30
  Administered 2015-09-30: 1 mg via INTRAVENOUS

## 2015-09-30 MED ORDER — GLYCOPYRROLATE 0.2 MG/ML IJ SOLN
INTRAMUSCULAR | Status: DC | PRN
  Administered 2015-09-30: .8 mg via INTRAVENOUS

## 2015-09-30 MED ORDER — FENTANYL CITRATE (PF) 100 MCG/2ML IJ SOLN
INTRAMUSCULAR | Status: DC | PRN
  Administered 2015-09-30 (×2): 50 ug via INTRAVENOUS

## 2015-09-30 MED ORDER — FUROSEMIDE 10 MG/ML IJ SOLN
INTRAMUSCULAR | Status: DC | PRN
  Administered 2015-09-30: 10 mg via INTRAMUSCULAR

## 2015-09-30 MED ORDER — FENTANYL CITRATE (PF) 100 MCG/2ML IJ SOLN: 25.0000 ug | INTRAMUSCULAR | Status: DC | PRN

## 2015-09-30 MED ORDER — NEOSTIGMINE METHYLSULFATE 10 MG/10ML IV SOLN
INTRAVENOUS | Status: DC | PRN
  Administered 2015-09-30: 4 mg via INTRAVENOUS

## 2015-09-30 MED ORDER — HYDROCODONE-ACETAMINOPHEN 5-325 MG PO TABS: 1.0000 | ORAL_TABLET | Freq: Four times a day (QID) | ORAL | Status: DC | PRN

## 2015-09-30 MED ORDER — GENTAMICIN SULFATE 40 MG/ML IJ SOLN: 80.0000 mg | Freq: Once | INTRAVENOUS | Status: DC

## 2015-09-30 SURGICAL SUPPLY — 41 items
ADAPTER IRRIG TUBE 2 SPIKE SOL (ADAPTER) ×6 IMPLANT
ADPR TBG 2 SPK PMP STRL ASCP (ADAPTER) ×2
BAG DRN LRG CPC RND TRDRP CNTR (MISCELLANEOUS) ×1
BAG URO DRAIN 2000ML W/SPOUT (MISCELLANEOUS) ×3 IMPLANT
BAG URO DRAIN 4000ML (MISCELLANEOUS) ×3 IMPLANT
CATH FOL 2WAY LX 20X30 (CATHETERS) IMPLANT
CATH FOL LEG HOLDER (MISCELLANEOUS) ×3 IMPLANT
CATH FOLEY 3WAY 30CC 22FR (CATHETERS) IMPLANT
CATH URETL 5X70 OPEN END (CATHETERS) ×3 IMPLANT
CNTNR SPEC 2.5X3XGRAD LEK (MISCELLANEOUS) ×1
CONT SPEC 4OZ STER OR WHT (MISCELLANEOUS) ×2
CONT SPEC 4OZ STRL OR WHT (MISCELLANEOUS) ×1
CONTAINER COLLECT MORCELLATR (MISCELLANEOUS) ×1 IMPLANT
CONTAINER SPEC 2.5X3XGRAD LEK (MISCELLANEOUS) ×1 IMPLANT
DRAPE SHEET LG 3/4 BI-LAMINATE (DRAPES) ×3 IMPLANT
DRAPE UTILITY 15X26 TOWEL STRL (DRAPES) ×3 IMPLANT
FILTER OVERFLOW MORCELLATOR (FILTER) ×1 IMPLANT
GLOVE BIO SURGEON STRL SZ 6.5 (GLOVE) ×2 IMPLANT
GLOVE BIO SURGEONS STRL SZ 6.5 (GLOVE) ×1
GOWN STRL REUS W/ TWL LRG LVL3 (GOWN DISPOSABLE) ×2 IMPLANT
GOWN STRL REUS W/TWL LRG LVL3 (GOWN DISPOSABLE) ×6
IV NS 1000ML (IV SOLUTION) ×162
IV NS 1000ML BAXH (IV SOLUTION) IMPLANT
KIT RM TURNOVER CYSTO AR (KITS) ×3 IMPLANT
MORCELLATOR COLLECT CONTAINER (MISCELLANEOUS) ×3
MORCELLATOR OVERFLOW FILTER (FILTER) ×3
MORCELLATOR ROTATION 4.75 335 (MISCELLANEOUS) ×3 IMPLANT
PACK CYSTO AR (MISCELLANEOUS) ×3 IMPLANT
PREP PVP WINGED SPONGE (MISCELLANEOUS) ×3 IMPLANT
PUMP SINGLE ACTION SAP (PUMP) IMPLANT
SENSORWIRE 0.038 NOT ANGLED (WIRE) ×6
SET IRRIG Y TYPE TUR BLADDER L (SET/KITS/TRAYS/PACK) ×3 IMPLANT
SOL .9 NS 3000ML IRR  AL (IV SOLUTION) ×8
SOL .9 NS 3000ML IRR AL (IV SOLUTION) ×4
SOL .9 NS 3000ML IRR UROMATIC (IV SOLUTION) ×6 IMPLANT
SYRINGE IRR TOOMEY STRL 70CC (SYRINGE) ×3 IMPLANT
TUBE PUMP MORCELLATOR PIRANHA (TUBING) ×3 IMPLANT
TUBING CONNECTING 10 (TUBING) ×2 IMPLANT
TUBING CONNECTING 10' (TUBING) ×1
WATER STERILE IRR 1000ML POUR (IV SOLUTION) ×3 IMPLANT
WIRE SENSOR 0.038 NOT ANGLED (WIRE) ×2 IMPLANT

## 2015-09-30 NOTE — H&P (Signed)
Expand All Collapse All      10:24 AM  Updated 09/30/15   [name] [name] 08-Nov-1931 [id number]  Referring provider: Dion Body, MD [facility] [place], [place] [address]  Chief Complaint  Patient presents with  . Results    UDS results    HPI: 82 year old male with BPH, h/o gross hematuria s/p negative work up 12/2015, incidental enhancing 1.2 cm upper pole renal mass s/p cryrotherapy , and h/o nephrolithiasis. He returns today for routine follow up.  BPH/ nocturia: He currently takes Flomax and finasteride. He continues to have significant urinary symptoms primarily urinary urgency, frequency, and episodes of urge incontinence. He also has severe nocturia x4-5 which seems to be improving recently to 1-2x. He does report a weak urinary stream but does feel as though he is able to empty his bladder completely.   He does have a history of urinary retention many years ago following bile duct surgery requiring Foley x 3 months. Cysto 11/2014 showed trabeculated bladder with significantly enlarged prostate, median lobe. TRUS vol 76 g at that time.   He does have a history of stroke in 2015.   He returns today after having a urodynamic study. This indicated that he was clearly obstructed with high bladder pressures and low flow (peak flow 7 ml/s, pDET at max flow 90 cm H20 consistent with obstructed voiding pattern). He also had relative small bladder capacity, 150 mL with low amplitude instability but unable to feel these contractions. His first sensation was accompanied by strong desire and with low reaction time and resulting in unstable contraction leading to urination. Bladder was trabeculated with possible right reflux. PVR 40.         IPSS     08/09/15 1600     International Prostate Symptom Score   How often have you had the sensation of not emptying your bladder? More than half the time     How  often have you had to urinate less than every two hours? About half the time     How often have you found you stopped and started again several times when you urinated? Almost always     How often have you found it difficult to postpone urination? Less than half the time     How often have you had a weak urinary stream? Less than half the time     How often have you had to strain to start urination? Not at All     How many times did you typically get up at night to urinate? 5 Times     Total IPSS Score 21     Quality of Life due to urinary symptoms   If you were to spend the rest of your life with your urinary condition just the way it is now how would you feel about that? Mostly Disatisfied         Hematuria: S/p work up in 12/2014 including CT Urogram/ cystoscopy  Renal mass: 1.2 cm left upper pole renal mass s/p cryotherapy on 02/2015 by IR. Tolerated procedure well. Following up CT scan today.   PMH: Past Medical History  Diagnosis Date  . Hypertension   . Cancer ([place]) 45 years ago melanoma  . Enlarged prostate 11/21/14  . Renal cancer ([place])   . Stroke ([place]) [id number]    slight weakness RT side  . Arthritis   . GERD (gastroesophageal reflux disease)   . History of kidney stones  Surgical History: Past Surgical History  Procedure Laterality Date  . Cholecystectomy N/A   . Joint replacement Bilateral 4-5 years ago    kness replaced   . Skin cancer excision  1973  . Laparotomy  2004    1 week after choley "kinicked bile duct"    Home Medications:    Medication List       This list is accurate as of: 08/30/15 10:24 AM. Always use your most recent med list.              amLODipine 5 MG tablet  Commonly known as: NORVASC  Take 5 mg by mouth every morning.     aspirin 81 MG tablet  Take 81 mg by mouth every other day.     atorvastatin  40 MG tablet  Commonly known as: LIPITOR  Take 40 mg by mouth daily.     beta carotene 25000 UNIT capsule  Take 25,000 Units by mouth daily.     finasteride 5 MG tablet  Commonly known as: PROSCAR  Take 5 mg by mouth daily.     hydrochlorothiazide 25 MG tablet  Commonly known as: HYDRODIURIL  Take 25 mg by mouth every morning.     lisinopril 40 MG tablet  Commonly known as: PRINIVIL,ZESTRIL  Take 40 mg by mouth every morning.     metoprolol 50 MG tablet  Commonly known as: LOPRESSOR  Take 50 mg by mouth 2 (two) times daily.     potassium chloride SA 20 MEQ tablet  Commonly known as: K-DUR,KLOR-CON  Take 20 mEq by mouth 2 (two) times daily.     tamsulosin 0.4 MG Caps capsule  Commonly known as: FLOMAX  Take 0.4 mg by mouth.     zinc gluconate 50 MG tablet  Take 50 mg by mouth daily.        Allergies: No Known Allergies  Family History: Family History  Problem Relation Age of Onset  . Cancer Brother   . Cancer Sister   . Cancer Sister   . Cancer - Prostate Brother     Social History:  reports that he quit smoking about 46 years ago. His smoking use included Cigarettes. He does not have any smokeless tobacco history on file. He reports that he does not drink alcohol or use illicit drugs.   Physical Exam: BP 131/79 mmHg  Pulse 68  Ht '5\' 8"'$  (1.727 m)  Wt 223 lb 4.8 oz (101.288 kg)  BMI 33.96 kg/m2  Constitutional: Alert and oriented, No acute distress. Presents with this son today. HEENT: [place] AT, moist mucus membranes. Trachea midline, no masses. Cardiovascular: No clubbing, cyanosis, or edema. CTAB. Respiratory: Normal respiratory effort, no increased work of breathing. RRR. Rectal: Normal external sphincter.  Skin: No rashes, bruises or suspicious lesions. Psychiatric: Normal mood and affect.  Laboratory Data:  Recent Labs    Lab Results  Component Value Date   WBC 8.9  03/16/2015   HGB 13.0 03/16/2015   HCT 39.1 03/16/2015   MCV 85.7 03/16/2015   PLT 202 03/16/2015       Recent Labs    Lab Results  Component Value Date   CREATININE 0.70 08/30/2015      UA Results for orders placed or performed during the hospital encounter of 08/30/15  I-STAT creatinine  Result Value Ref Range   Creatinine, Ser 0.70 0.61 - 1.24 mg/dL      Assessment & Plan:   1. BPH with obstruction/lower urinary tract symptoms Both obstructive  and irritative voiding symptoms Currently on maximal medical therapy with finasteride and Flomax. Significantly enlarged prostate, median lobe. TRUS vol 76.  BOOI consistent with obstruction with some instability and small capacity bladder.   Discussed alternatives today including conservative mannagement vs. Surgical intervention. He is high risk for recurrent episodes of retention without intervention.   Risks of surgery including risk of bleeding, infection, damaged to surrounding structures, need for Foley catheter, worsened urinary incontinence, frequency, and irritative voiding symptom discussed in detail. Incontinence is highly likely at least in the beginning in the setting of small bladder capacity and bladder instability demonstrated on UDS.   We discussed that there will be a need for bladder rehabilitation following surgery.   He and his son agree that they would like to precede with HoLEP. Procedure and alterative outlet surgeries also offered and discussed.   All questions answered.   2. Nocturia As above. Improved from previous with behavioral modification.  3. Left renal mass S/p L cryo 02/2015 Repeat imaging 08/30/15 NED  Schedule HoLEP  Hollice Espy, MD  [facility] [address], [place], [place] [address] [phone]

## 2015-09-30 NOTE — Discharge Instructions (Signed)
Foley Catheter Care, Adult A Foley catheter is a soft, flexible tube. This tube is placed into your bladder to drain pee (urine). If you go home with this catheter in place, follow the instructions below. TAKING CARE OF THE CATHETER 1. Wash your hands with soap and water. 2. Put soap and water on a clean washcloth.  Clean the skin where the tube goes into your body.  Clean away from the tube site.  Never wipe toward the tube.  Clean the area using a circular motion.  Remove all the soap. Pat the area dry with a clean towel. For males, reposition the skin that covers the end of the penis (foreskin). 3. Attach the tube to your leg with tape or a leg strap. Do not stretch the tube tight. If you are using tape, remove any stickiness left behind by past tape you used. 4. Keep the drainage bag below your hips. Keep it off the floor. 5. Check your tube during the day. Make sure it is working and draining. Make sure the tube does not curl, twist, or bend. 6. Do not pull on the tube or try to take it out. TAKING CARE OF THE DRAINAGE BAGS You will have a large overnight drainage bag and a small leg bag. You may wear the overnight bag any time. Never wear the small bag at night. Follow the directions below. Emptying the Drainage Bag Empty your drainage bag when it is  - full or at least 2-3 times a day. 1. Wash your hands with soap and water. 2. Keep the drainage bag below your hips. 3. Hold the dirty bag over the toilet or clean container. 4. Open the pour spout at the bottom of the bag. Empty the pee into the toilet or container. Do not let the pour spout touch anything. 5. Clean the pour spout with a gauze pad or cotton ball that has rubbing alcohol on it. 6. Close the pour spout. 7. Attach the bag to your leg with tape or a leg strap. 8. Wash your hands well. Changing the Drainage Bag Change your bag once a month or sooner if it starts to smell or look dirty.  1. Wash your hands with soap  and water. 2. Pinch the rubber tube so that pee does not spill out. 3. Disconnect the catheter tube from the drainage tube at the connection valve. Do not let the tubes touch anything. 4. Clean the end of the catheter tube with an alcohol wipe. Clean the end of a the drainage tube with a different alcohol wipe. 5. Connect the catheter tube to the drainage tube of the clean drainage bag. 6. Attach the new bag to the leg with tape or a leg strap. Avoid attaching the new bag too tightly. 7. Wash your hands well. Cleaning the Drainage Bag 1. Wash your hands with soap and water. 2. Wash the bag in warm, soapy water. 3. Rinse the bag with warm water. 4. Fill the bag with a mixture of white vinegar and water (1 cup vinegar to 1 quart warm water [.2 liter vinegar to 1 liter warm water]). Close the bag and soak it for 30 minutes in the solution. 5. Rinse the bag with warm water. 6. Hang the bag to dry with the pour spout open and hanging downward. 7. Store the clean bag (once it is dry) in a clean plastic bag. 8. Wash your hands well. PREVENT INFECTION  Wash your hands before and after touching your tube.  Take showers every day. Wash the skin where the tube enters your body. Do not take baths. Replace wet leg straps with dry ones, if this applies.  Do not use powders, sprays, or lotions on the genital area. Only use creams, lotions, or ointments as told by your doctor.  For females, wipe from front to back after going to the bathroom.  Drink enough fluids to keep your pee clear or pale yellow unless you are told not to have too much fluid (fluid restriction).  Do not let the drainage bag or tubing touch or lie on the floor.  Wear cotton underwear to keep the area dry. GET HELP IF:  Your pee is cloudy or smells unusually bad.  Your tube becomes clogged.  You are not draining pee into the bag or your bladder feels full.  Your tube starts to leak. GET HELP RIGHT AWAY IF:  You have  pain, puffiness (swelling), redness, or yellowish-white fluid (pus) where the tube enters the body.  You have pain in the belly (abdomen), legs, lower back, or bladder.  You have a fever.  You see blood fill the tube, or your pee is pink or red.  You feel sick to your stomach (nauseous), throw up (vomit), or have chills.  Your tube gets pulled out. MAKE SURE YOU:   Understand these instructions.  Will watch your condition.  Will get help right away if you are not doing well or get worse.   YOU WILL REMOVE YOUR OWN FOLEY ON Wednesday AM.   To do this, used a syringe supplied ter and if 30 and amounts from the balloon.gently pull the catheter until it is removed in entirety.  If you have issues, please call our office.    Hollice Espy, MD AMBULATORY SURGERY  DISCHARGE INSTRUCTIONS   1) The drugs that you were given will stay in your system until tomorrow so for the next 24 hours you should not:  A) Drive an automobile B) Make any legal decisions C) Drink any alcoholic beverage   2) You may resume regular meals tomorrow.  Today it is better to start with liquids and gradually work up to solid foods.  You may eat anything you prefer, but it is better to start with liquids, then soup and crackers, and gradually work up to solid foods.   3) Please notify your doctor immediately if you have any unusual bleeding, trouble breathing, redness and pain at the surgery site, drainage, fever, or pain not relieved by medication.    4) Additional Instructions:        Please contact your physician with any problems or Same Day Surgery at [phone], Monday through Friday 6 am to 4 pm, or [place] at [facility] number at [phone].

## 2015-09-30 NOTE — OR Nursing (Signed)
Dr Rosey Bath reviewed potassium.

## 2015-09-30 NOTE — Op Note (Signed)
Date of procedure: 09/30/2015  Preoperative diagnosis:  1. BPH with bladder outlet obstruction   Postoperative diagnosis:  1. BPH with bladder outlet obstruction 2. Bladder stone   Procedure: 1. Cystoscopy 2. Cystolitholapaxy 3. Holmium laser enucleation of the prostate with morcellation  Surgeon: Hollice Espy, MD  Anesthesia: General  Complications: None  Intraoperative findings: Large bilobed median lobe and obstructing lateral lobes notable. Median lobe and left lateral lobe enucleated (right lateral lobe left in place to help with continence). Incidental 2 cm bladder stone identified and treated.  EBL: Minimal  Specimens: Prostate chips  Drains: 38 French two-way Foley catheter  Indication: [name] [name] is a 82 y.o. patient with UDS proven bladder outlet obstruction with a large prostate with obstructing median lobe and elevated bladder neck.  After reviewing the management options for treatment, he elected to proceed with the above surgical procedure(s). We have discussed the potential benefits and risks of the procedure, side effects of the proposed treatment, the likelihood of the patient achieving the goals of the procedure, and any potential problems that might occur during the procedure or recuperation. Informed consent has been obtained.  Description of procedure:  The patient was taken to the operating room and general anesthesia was induced.  The patient was placed in the dorsal lithotomy position, prepped and draped in the usual sterile fashion, and preoperative antibiotics were administered. A preoperative time-out was performed.   At this point in time, a 38 French resectoscope was advanced per urethra into the bladder using the metal obturator. At this time the bladder and prostatic fossa were carefully inspected which revealed a significantly elevated bladder neck with a large intravesical median lobe which appeared to have 2 lobular components. There also  obstructing lateral lobes noted. The bladder was heavily trabeculated with multiple saccules. The trigone was difficult to visualize initially due to the presence of a median lobe. An incidental 2 cm bladder stone was then identified which had not been previously identified on cystoscopy performed some time ago. Next, a 550  laser fiber was brought in and using settings of 2 J and 20 Hz, the stone was fragmented into very small pieces and evacuated from the bladder. Next  Next, 2 incisions were created using the settings of 2 J and 40 Hz at the 8:00 and 4:00 positions on the bladder neck alongside of the large bivalved median lobe. The incisions were carried down to just proximal to the veru where they met in the midline. The median lobe was then enucleated starting from a distal to proximal direction separating the adenoma from the capsule rolling it cranially until ultimately was able to be cleaved off of the bladder neck. Once this was performed and pushed into the bladder, the bladder neck was immediately flattened and more anatomic. The UOs could be easily identified and were quite far away from the incisions on the bladder neck. Care was taken to avoid any undermining of the bladder neck. Next, the left lateral lobe was treated. I started by creating curvilinear incision at the left apex of the lateral lobe just proximal to the veru.  Again, I proceeded by separating the adenoma from the underlying capsule again and a distal to proximal direction until the entire left lateral lobe was hanging by an attachment at the anterior commissure. Once this was ultimately able to be cleaved, the lateral lobe was able to be freed into the bladder. A number of small BPH nodules were then piecewise enucleated from the prostatic  fossa. At this point time, the fossa was noted to be widely patent after enucleating the primary obstructing component specifically the median lobe as well as the left lateral lobe. Given his age  and history of bladder instability, I did elect to leave the right prostatic lobe to help with his continence postoperatively. Hemostasis was adequate at this point.  The outer sheath of the resectoscope was left in place and nephroscope was brought in. The Piranha morcellator was then used to morcellate the large lobes carefully to avoid any injury to the bladder. The bladder was filled to its maximum in order to facilitate this. A few small BPH nodules were then grasped using grasping forceps and brought out through the sheath. At this point time, the bladder was clear of all fragments in stones. The scope was then removed. A 20 French two-way Foley catheter was then advanced per urethra into the bladder using a catheter guide. The balloon was filled with 30 cc of sterile water. The patient was [name] [name] of IV Lasix to help facilitate diuresis. He was then cleaned and dried. Care was taken to place his foreskin back into the anatomic position and there was some edema of the foreskin noted. The catheter was secured to the patient's left leg using a catheter secure. He was then reversed anesthesia and taken the PACU in stable condition. His urine remained satisfactory clear.   Plan: Patient will follow-up in 6 weeks. He will remove his own catheter in 2 days.   Hollice Espy, M.D.

## 2015-09-30 NOTE — Anesthesia Preprocedure Evaluation (Addendum)
Anesthesia Evaluation  Patient identified by MRN, date of birth, ID band Patient awake    Reviewed: Allergy & Precautions, H&P , NPO status , Patient's Chart, lab work & pertinent test results, reviewed documented beta blocker date and time   History of Anesthesia Complications Negative for: history of anesthetic complications  Airway Mallampati: IV  TM Distance: >3 FB Neck ROM: full    Dental no notable dental hx. (+) Missing   Pulmonary neg pulmonary ROS, former smoker,    Pulmonary exam normal breath sounds clear to auscultation       Cardiovascular Exercise Tolerance: Good hypertension, On Medications and On Home Beta Blockers (-) angina(-) CAD, (-) Past MI, (-) Cardiac Stents and (-) CABG Normal cardiovascular exam(-) dysrhythmias (-) Valvular Problems/Murmurs Rhythm:regular Rate:Normal     Neuro/Psych neg Seizures CVA, Residual Symptoms negative psych ROS   GI/Hepatic Neg liver ROS, GERD  ,  Endo/Other  negative endocrine ROS  Renal/GU Renal disease (Renal cancer s/p excision)  negative genitourinary   Musculoskeletal   Abdominal   Peds  Hematology negative hematology ROS (+)   Anesthesia Other Findings Past Medical History:   Hypertension                                                 Cancer ([place])                                    45 years *   Enlarged prostate                               11/21/14       Renal cancer ([place])                                           Stroke ([place])                                    [id number]       Comment:slight weakness RT side   Arthritis                                                    GERD (gastroesophageal reflux disease)                       History of kidney stones                                     Reproductive/Obstetrics negative OB ROS                            Anesthesia Physical Anesthesia Plan  ASA: III  Anesthesia Plan: General    Post-op Pain Management:    Induction:   Airway Management Planned:   Additional  Equipment:   Intra-op Plan:   Post-operative Plan:   Informed Consent: I have reviewed the patients History and Physical, chart, labs and discussed the procedure including the risks, benefits and alternatives for the proposed anesthesia with the patient or authorized representative who has indicated his/her understanding and acceptance.   Dental Advisory Given  Plan Discussed with: Anesthesiologist, CRNA and Surgeon  Anesthesia Plan Comments:        Anesthesia Quick Evaluation

## 2015-09-30 NOTE — Anesthesia Postprocedure Evaluation (Signed)
Anesthesia Post Note  Patient: [name] [name]  Procedure(s) Performed: Procedure(s) (LRB): HOLEP-LASER ENUCLEATION OF THE PROSTATE WITH MORCELLATION (N/A) CYSTOSCOPY WITH LITHOLAPAXY (N/A)  Patient location during evaluation: PACU Anesthesia Type: General Level of consciousness: awake and alert Pain management: pain level controlled Vital Signs Assessment: post-procedure vital signs reviewed and stable Respiratory status: spontaneous breathing, nonlabored ventilation, respiratory function stable and patient connected to nasal cannula oxygen Cardiovascular status: blood pressure returned to baseline and stable Postop Assessment: no signs of nausea or vomiting Anesthetic complications: no    Last Vitals:  Filed Vitals:   09/30/15 1400 09/30/15 1412  BP: 135/73   Pulse: 56 56  Temp: 35.9 C   Resp: 14 9    Last Pain: There were no vitals filed for this visit.               Martha Clan

## 2015-09-30 NOTE — Anesthesia Procedure Notes (Signed)
Procedure Name: Intubation Performed by: Lance Muss Pre-anesthesia Checklist: Patient identified, Emergency Drugs available, Suction available, Patient being monitored and Timeout performed Patient Re-evaluated:Patient Re-evaluated prior to inductionOxygen Delivery Method: Circle system utilized Preoxygenation: Pre-oxygenation with 100% oxygen Intubation Type: IV induction Ventilation: Mask ventilation without difficulty and Oral airway inserted - appropriate to patient size Laryngoscope Size: Mac and 3 Grade View: Grade I Tube type: Oral Tube size: 7.0 mm Number of attempts: 2 (not able to pass 7.5 ETT, inserted and passed easily 7.0 ETT) Airway Equipment and Method: Stylet Placement Confirmation: ETT inserted through vocal cords under direct vision,  breath sounds checked- equal and bilateral and positive ETCO2 Secured at: 22 cm Tube secured with: Tape Dental Injury: Teeth and Oropharynx as per pre-operative assessment

## 2015-09-30 NOTE — Transfer of Care (Signed)
Immediate Anesthesia Transfer of Care Note  Patient: [name] [name]  Procedure(s) Performed: Procedure(s): HOLEP-LASER ENUCLEATION OF THE PROSTATE WITH MORCELLATION (N/A) CYSTOSCOPY WITH LITHOLAPAXY (N/A)  Patient Location: PACU  Anesthesia Type:General  Level of Consciousness: confused and responds to stimulation  Airway & Oxygen Therapy: Patient Spontanous Breathing and Patient connected to face mask oxygen  Post-op Assessment: Report given to RN and Post -op Vital signs reviewed and stable  Post vital signs: Reviewed and stable  Last Vitals:  Filed Vitals:   09/30/15 0803 09/30/15 1304  BP: 137/76 120/63  Pulse: 61 65  Temp: 36.1 C   Resp: 16 12    Complications: No apparent anesthesia complications

## 2015-10-01 ENCOUNTER — Encounter: Payer: Self-pay | Admitting: Urology

## 2015-10-03 LAB — SURGICAL PATHOLOGY

## 2015-10-17 ENCOUNTER — Other Ambulatory Visit: Payer: Medicare Other

## 2015-10-17 ENCOUNTER — Encounter: Payer: Self-pay | Admitting: Urology

## 2015-10-18 ENCOUNTER — Other Ambulatory Visit: Payer: Medicare Other

## 2015-10-18 DIAGNOSIS — N390 Urinary tract infection, site not specified: Secondary | ICD-10-CM

## 2015-10-21 ENCOUNTER — Telehealth: Payer: Self-pay

## 2015-10-21 DIAGNOSIS — N390 Urinary tract infection, site not specified: Secondary | ICD-10-CM

## 2015-10-21 LAB — CULTURE, URINE COMPREHENSIVE

## 2015-10-21 MED ORDER — NITROFURANTOIN MONOHYD MACRO 100 MG PO CAPS: 100.0000 mg | ORAL_CAPSULE | Freq: Two times a day (BID) | ORAL | Status: AC

## 2015-10-21 NOTE — Telephone Encounter (Signed)
LMOM-medication sent to pharmacy. Myrbetriq samples can be given. Requested call back.

## 2015-10-21 NOTE — Telephone Encounter (Signed)
Spoke with pt in reference to urinary symptoms. Pt stated that he is having dysuria, leakage, and increased frequency at night.

## 2015-10-21 NOTE — Telephone Encounter (Signed)
Lets treat for presumed UTI, macrobid bid x 10 days, also have him come pick up Mybetriq 25 mg daily (sampels) x 2 weeks to see if that helps.    These symptoms are fairly common post op.    Hollice Espy, MD

## 2015-10-21 NOTE — Telephone Encounter (Signed)
-----   Message from Hollice Espy, MD sent at 10/21/2015  7:33 AM EDT ----- UCx grew low colony count of mix flora which is likely a contaminant.  Are his symptoms improving?  Hollice Espy, MD

## 2015-10-22 NOTE — Telephone Encounter (Signed)
Spoke with pt in reference to abx and samples. Pt will pick up both medications this morning.

## 2015-11-12 ENCOUNTER — Encounter: Payer: Medicare Other | Admitting: Urology

## 2015-11-13 ENCOUNTER — Encounter: Payer: Self-pay | Admitting: Urology

## 2015-11-13 ENCOUNTER — Ambulatory Visit (INDEPENDENT_AMBULATORY_CARE_PROVIDER_SITE_OTHER): Payer: Medicare Other | Admitting: Urology

## 2015-11-13 VITALS — BP 149/77 | HR 66 | Ht 68.0 in | Wt 217.0 lb

## 2015-11-13 DIAGNOSIS — N2889 Other specified disorders of kidney and ureter: Secondary | ICD-10-CM

## 2015-11-13 DIAGNOSIS — N400 Enlarged prostate without lower urinary tract symptoms: Secondary | ICD-10-CM

## 2015-11-13 LAB — URINALYSIS, COMPLETE
Bilirubin, UA: NEGATIVE
Glucose, UA: NEGATIVE
Nitrite, UA: NEGATIVE
Specific Gravity, UA: 1.025 (ref 1.005–1.030)
Urobilinogen, Ur: 1 mg/dL (ref 0.2–1.0)
pH, UA: 6 (ref 5.0–7.5)

## 2015-11-13 LAB — MICROSCOPIC EXAMINATION: Bacteria, UA: NONE SEEN

## 2015-11-13 NOTE — Progress Notes (Signed)
10:33 AM  11/13/2015  [name] [name] [birth year] [id number]  Referring provider: Dion Body, MD [facility] [place], [place] [address]  Chief Complaint  Patient presents with  . Benign Prostatic Hypertrophy    6wk post op    HPI: 82 year old male with BPH, h/o gross hematuria s/p negative work up 12/2015, incidental enhancing 1.2 cm upper pole renal mass s/p cryrotherapy , and h/o nephrolithiasis.  He returns today for routine follow up.   BPH/ nocturia: S/p HoLEP on 09/30/15  Prior to surgery, baseline symptoms included urgency, frequency, weak stream, history of retention, and urge incontinence. He did have preoperative urodynamics which did indicate obstruction with high bladder pressures, and low peak flow of 7 and also per second. He also had some significant instability and a small bladder capacity.  He does have a history of urinary retention many years ago following bile duct surgery requiring Foley x 3 months.    Cysto 11/2014 showed trabeculated bladder with significantly enlarged prostate, median lobe.  TRUS vol 76 g at that time.   He continues to takes Flomax and finasteride.    He does have a history of stroke in 2015.   He did call a few weeks ago complaining of continued burning and worsened incontinence. He was called in antibiotics for presumed infection although urine culture grew 25K mixed flora.  He does have some memory issues and did not pick up this prescription.  His burning has since resolved and his incontinence has improved back to baseline. He denies any stress incontinence today. Overall, he feels that his stream is significantly improved and he has less nocturia.  Surgical pathology shows benign prostatic glandular and stromal hyperplasia. No evidence of malignancy. 37 g were enucleated.  Median lobe as well as left lateral lobe were enucleated. An incidental to send meter bladder stone was also treated.  He returns today  for routine follow-up.  Hematuria: S/p work up in 12/2014 including CT Urogram/ cystoscopy  Renal mass: 1.2 cm left upper pole renal mass s/p cryotherapy on 02/2015 by IR.  Repeat CT scan 08/2015 with NED.  Followed by IR.    PMH: Past Medical History  Diagnosis Date  . Hypertension   . Cancer ([place]) 45 years ago melanoma  . Enlarged prostate 11/21/14  . Renal cancer ([place])   . Stroke ([place]) [id number]    slight weakness RT side  . Arthritis   . GERD (gastroesophageal reflux disease)   . History of kidney stones     Surgical History: Past Surgical History  Procedure Laterality Date  . Cholecystectomy N/A   . Joint replacement Bilateral 4-5 years ago    kness replaced   . Skin cancer excision  1973  . Laparotomy  2004    1 week after choley "kinicked bile duct"  . Holep-laser enucleation of the prostate with morcellation N/A 09/30/2015    Procedure: HOLEP-LASER ENUCLEATION OF THE PROSTATE WITH MORCELLATION;  Surgeon: Hollice Espy, MD;  Location: [facility] ORS;  Service: Urology;  Laterality: N/A;  . Cystoscopy with litholapaxy N/A 09/30/2015    Procedure: CYSTOSCOPY WITH LITHOLAPAXY;  Surgeon: Hollice Espy, MD;  Location: [facility] ORS;  Service: Urology;  Laterality: N/A;    Home Medications:    Medication List       This list is accurate as of: 11/13/15 10:33 AM.  Always use your most recent med list.  amLODipine 5 MG tablet  Commonly known as:  NORVASC  Take 5 mg by mouth every morning.     aspirin 81 MG tablet  Take 81 mg by mouth every other day.     atorvastatin 40 MG tablet  Commonly known as:  LIPITOR  Take 40 mg by mouth daily.     beta carotene 25000 UNIT capsule  Take 25,000 Units by mouth daily.     finasteride 5 MG tablet  Commonly known as:  PROSCAR  Take 5 mg by mouth daily.     hydrochlorothiazide 25 MG tablet  Commonly known as:  HYDRODIURIL  Take 25 mg by mouth every morning.     lisinopril 40 MG tablet  Commonly known as:   PRINIVIL,ZESTRIL  Take 40 mg by mouth every morning.     metoprolol 50 MG tablet  Commonly known as:  LOPRESSOR  Take 50 mg by mouth 2 (two) times daily.     potassium chloride SA 20 MEQ tablet  Commonly known as:  K-DUR,KLOR-CON  Take 20 mEq by mouth 2 (two) times daily.     tamsulosin 0.4 MG Caps capsule  Commonly known as:  FLOMAX  Take 0.4 mg by mouth.     zinc gluconate 50 MG tablet  Take 50 mg by mouth daily.        Allergies: No Known Allergies  Family History: Family History  Problem Relation Age of Onset  . Cancer Brother   . Cancer Sister   . Cancer Sister   . Cancer - Prostate Brother     Social History:  reports that he quit smoking about 47 years ago. His smoking use included Cigarettes. He does not have any smokeless tobacco history on file. He reports that he does not drink alcohol or use illicit drugs.   Physical Exam: BP 149/77 mmHg  Pulse 66  Ht '5\' 8"'$  (1.727 m)  Wt 217 lb (98.431 kg)  BMI 33.00 kg/m2  Constitutional:  Alert and oriented, No acute distress.  Presents with this son today. HEENT: [place] AT, moist mucus membranes.  Trachea midline, no masses. Cardiovascular: No clubbing, cyanosis, or edema.  Respiratory: Normal respiratory effort, no increased work of breathing.  Skin: No rashes, bruises or suspicious lesions. Psychiatric: Normal mood and affect.  Laboratory Data: Lab Results  Component Value Date   WBC 8.2 09/20/2015   HGB 15.6 09/30/2015   HCT 46.0 09/30/2015   MCV 82.2 09/20/2015   PLT 189 09/20/2015    Lab Results  Component Value Date   CREATININE 0.70 08/30/2015    UA Results for orders placed or performed in visit on 11/13/15  Microscopic Examination  Result Value Ref Range   WBC, UA 11-30 (A) 0 -  5 /hpf   RBC, UA 11-30 (A) 0 -  2 /hpf   Epithelial Cells (non renal) 0-10 0 - 10 /hpf   Mucus, UA Present (A) Not Estab.   Bacteria, UA None seen None seen/Few  Urinalysis, Complete  Result Value Ref Range    Specific Gravity, UA 1.025 1.005 - 1.030   pH, UA 6.0 5.0 - 7.5   Color, UA Yellow Yellow   Appearance Ur Cloudy (A) Clear   Leukocytes, UA Trace (A) Negative   Protein, UA 2+ (A) Negative/Trace   Glucose, UA Negative Negative   Ketones, UA 1+ (A) Negative   RBC, UA 2+ (A) Negative   Bilirubin, UA Negative Negative   Urobilinogen, Ur 1.0 0.2 - 1.0 mg/dL  Nitrite, UA Negative Negative   Microscopic Examination See below:       Assessment & Plan:    1. BPH with obstruction/lower urinary tract symptoms S/p HoLEP Improved obstructive symptoms with persistent irritative voiding symptoms We'll send off repeat urine culture to rule out infection and treat as needed  Advised to stop Flomax and finasteride  2. Nocturia Improved  3. Left renal mass S/p L cryo 02/2015 Repeat imaging 08/30/15 NED  Return in about 6 months (around 05/14/2016) for Uroflow, PVR.   Hollice Espy, MD  [facility] [address] [place], [place] [address] [phone]

## 2015-11-15 LAB — CULTURE, URINE COMPREHENSIVE

## 2015-11-18 ENCOUNTER — Telehealth: Payer: Self-pay

## 2015-11-18 NOTE — Telephone Encounter (Signed)
Mailbox full

## 2015-11-18 NOTE — Telephone Encounter (Signed)
-----   Message from Hollice Espy, MD sent at 11/15/2015  5:01 PM EDT ----- No need for abx.  Please let his son know.  Hollice Espy, MD

## 2015-11-21 DIAGNOSIS — E876 Hypokalemia: Secondary | ICD-10-CM | POA: Insufficient documentation

## 2015-11-21 NOTE — Telephone Encounter (Signed)
Spoke with Jonni Sanger, pt son, in reference not needing abx. Jonni Sanger voiced understanding. Jonni Sanger requested Dr. Erlene Quan be made aware since pt has come off some medications he has been doing much better and only gets up about 2x a night now.

## 2016-05-27 DIAGNOSIS — Z0000 Encounter for general adult medical examination without abnormal findings: Secondary | ICD-10-CM | POA: Insufficient documentation

## 2016-08-24 IMAGING — CT CT GUIDANCE TISSUE ABLATION
1 series · 1 of 32 positions shown · non-contrast
Comparison: none

CLINICAL DATA: 12 mm left upper pole enhancing renal neoplasm
compatible with a renal cell carcinoma

[Series 3: localizer · axial · 5.0mm · 0.57mm/px · 1 of 46 slices shown]
[im 24/46]
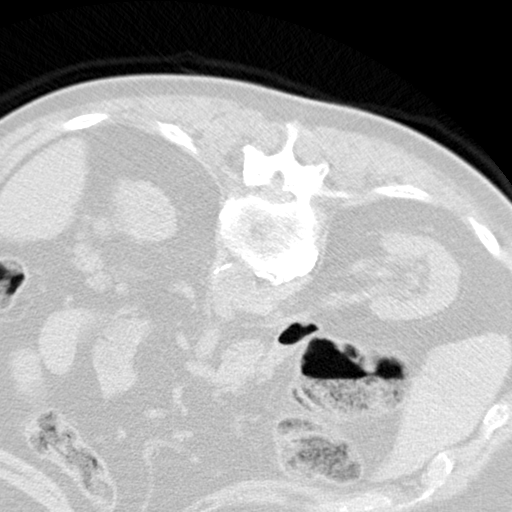

[1 of 32 positions shown; findings below may reference images not displayed]

EXAM:
CT-GUIDED PERCUTANEOUS CRYOABLATION OF LEFT UPPER POLE RENAL CELL
CARCINOMA

ANESTHESIA/SEDATION:
General

MEDICATIONS:
2 gram IV Ancef. The antibiotic was administered in an appropriate
time interval prior to needle puncture of the skin.

CONTRAST:  None.

PROCEDURE:
The procedure, risks, benefits, and alternatives were explained to
the patient. Questions regarding the procedure were encouraged and
answered. The patient understands and consents to the procedure.

The patient was placed under general anesthesia. Initial unenhanced
CT was performed in a prone position to localize the left upper pole
renal cell carcinoma.

The left posterior flank was prepped with Betadine in a sterile
fashion, and a sterile drape was applied covering the operative
field. A sterile gown and sterile gloves were used for the
procedure.

Under CT guidance, an Ice Pearl 2.1 percutaneous cryoablation probe
was advanced into the left upper pole renal mass. Probe positioning
was confirmed by CT prior to cryoablation.

Cryoablation was performed through the Ice Pearl probe. Initial 10
minute cycle of cryoablation was performed. This was followed by a 8
minute thaw cycle. A second 10 minute cycle of cryoablation was then
performed. During ablation, periodic CT imaging was performed to
monitor ice ball formation and morphology. After active thaw and
cautery, the cryoablation probe was removed.

Post-procedural CT was performed.

COMPLICATIONS:
None
FINDINGS: CT imaging confirms cryoablation probe insertion to the left upper
pole renal mass. Serial CT imaging during the procedure demonstrates
adequate coverage by the visualized ice ball. Postprocedure imaging
demonstrates no significant perinephric or subcapsular hemorrhage.
No hydronephrosis.
IMPRESSION: CT guided percutaneous cryoablation of a left upper pole small renal
cell carcinoma. The patient will be observed overnight. Initial
follow-up will be performed in approximately 4 weeks.

## 2016-08-26 ENCOUNTER — Other Ambulatory Visit (HOSPITAL_COMMUNITY): Payer: Self-pay | Admitting: Interventional Radiology

## 2016-08-26 ENCOUNTER — Other Ambulatory Visit: Payer: Self-pay | Admitting: *Deleted

## 2016-08-26 DIAGNOSIS — C642 Malignant neoplasm of left kidney, except renal pelvis: Secondary | ICD-10-CM

## 2016-09-02 ENCOUNTER — Ambulatory Visit
Admission: RE | Admit: 2016-09-02 | Discharge: 2016-09-02 | Disposition: A | Discharge status: Home / Self Care | Payer: Medicare Other | Source: Ambulatory Visit | Attending: Interventional Radiology | Admitting: Interventional Radiology

## 2016-09-02 DIAGNOSIS — C642 Malignant neoplasm of left kidney, except renal pelvis: Secondary | ICD-10-CM | POA: Insufficient documentation

## 2016-09-02 LAB — POCT I-STAT CREATININE: CREATININE: 0.7 mg/dL (ref 0.61–1.24)

## 2016-09-02 MED ORDER — IOPAMIDOL (ISOVUE-370) INJECTION 76%
100.0000 mL | Freq: Once | INTRAVENOUS | Status: AC | PRN
  Administered 2016-09-02: 100 mL via INTRAVENOUS

## 2016-09-08 ENCOUNTER — Encounter: Payer: Self-pay | Admitting: Radiology

## 2016-09-08 ENCOUNTER — Ambulatory Visit
Admission: RE | Admit: 2016-09-08 | Discharge: 2016-09-08 | Disposition: A | Discharge status: Home / Self Care | Payer: Medicare Other | Source: Ambulatory Visit | Attending: Interventional Radiology | Admitting: Interventional Radiology

## 2016-09-08 DIAGNOSIS — C642 Malignant neoplasm of left kidney, except renal pelvis: Secondary | ICD-10-CM

## 2016-09-08 HISTORY — PX: IR GENERIC HISTORICAL: IMG1180011

## 2016-09-08 NOTE — Progress Notes (Signed)
Patient ID: [name] [name], male   DOB: 1932/06/12, 83 y.o.   MRN: [id number]   Referring Physician(s): Hollice Espy  Chief Complaint: The patient is seen in follow up today s/p left renal cryoablation on 03-15-15  History of present illness:  [name] [name] is an 83 yo male who presents today for his annual follow up s/p left renal cryoablation for renal cell carcinoma in 2016.  He is doing very well from this.  He does complain of some urinary leakage, but other than that he has no other complaints.  He denies any hematuria or dysuria.  He is eating well and reports no weight loss.  He had a CT of his abd/pel last week for his follow up today.    Past Medical History:  Diagnosis Date  . Arthritis   . Cancer ([place]) 45 years ago melanoma  . Enlarged prostate 11/21/14  . GERD (gastroesophageal reflux disease)   . History of kidney stones   . Hypertension   . Renal cancer ([place])   . Stroke [facility]) [id number]   slight weakness RT side    Past Surgical History:  Procedure Laterality Date  . CHOLECYSTECTOMY N/A   . CYSTOSCOPY WITH LITHOLAPAXY N/A 09/30/2015   Procedure: CYSTOSCOPY WITH LITHOLAPAXY;  Surgeon: Hollice Espy, MD;  Location: [facility] ORS;  Service: Urology;  Laterality: N/A;  . HOLEP-LASER ENUCLEATION OF THE PROSTATE WITH MORCELLATION N/A 09/30/2015   Procedure: HOLEP-LASER ENUCLEATION OF THE PROSTATE WITH MORCELLATION;  Surgeon: Hollice Espy, MD;  Location: [facility] ORS;  Service: Urology;  Laterality: N/A;  . JOINT REPLACEMENT Bilateral 4-5 years ago   kness replaced   . LAPAROTOMY  2004   1 week after choley "kinicked bile duct"  . SKIN CANCER EXCISION  1973    Allergies: Patient has no known allergies.  Medications: Prior to Admission medications   Medication Sig Start Date End Date Taking? Authorizing Provider  amLODipine (NORVASC) 5 MG tablet Take 5 mg by mouth every morning.    Yes Historical Provider, MD  aspirin 81 MG tablet Take 81 mg by mouth every other day.    Yes Historical  Provider, MD  atorvastatin (LIPITOR) 40 MG tablet Take 40 mg by mouth daily.   Yes Historical Provider, MD  beta carotene 25000 UNIT capsule Take 25,000 Units by mouth daily.   Yes Historical Provider, MD  hydrochlorothiazide (HYDRODIURIL) 25 MG tablet Take 25 mg by mouth every morning.    Yes Historical Provider, MD  lisinopril (PRINIVIL,ZESTRIL) 40 MG tablet Take 40 mg by mouth every morning.    Yes Historical Provider, MD  metoprolol (LOPRESSOR) 50 MG tablet Take 50 mg by mouth 2 (two) times daily.   Yes Historical Provider, MD  potassium chloride SA (K-DUR,KLOR-CON) 20 MEQ tablet Take 20 mEq by mouth 2 (two) times daily.   Yes Historical Provider, MD  zinc gluconate 50 MG tablet Take 50 mg by mouth daily.   Yes Historical Provider, MD     Family History  Problem Relation Age of Onset  . Cancer Brother   . Cancer Sister   . Cancer Sister   . Cancer - Prostate Brother     Social History   Social History  . Marital status: Single    Spouse name: N/A  . Number of children: N/A  . Years of education: N/A   Social History Main Topics  . Smoking status: Former Smoker    Types: Cigarettes    Quit date: 11/20/1968  . Smokeless tobacco: Not  on file  . Alcohol use No  . Drug use: No  . Sexual activity: Not on file   Other Topics Concern  . Not on file   Social History Narrative  . No narrative on file     Vital Signs: BP (!) 188/93 (BP Location: Left Arm, Patient Position: Sitting, Cuff Size: Large)   Pulse 60   Temp 97.7 F (36.5 C) (Oral)   Resp 16   Ht '5\' 8"'$  (1.727 m)   Wt 206 lb (93.4 kg)   SpO2 95%   BMI 31.32 kg/m   Physical Exam  GEN: obese, NAD Heart: regular rate and rhythm Lungs: CTAB Abd: soft, obese, NT, +BS  Imaging: No results found.  Labs:  CBC:  Recent Labs  09/20/15 0948 09/30/15 0830  WBC 8.2  --   HGB 14.0 15.6  HCT 41.9 46.0  PLT 189  --     COAGS: No results for input(s): INR, APTT in the last 8760 hours.  BMP:  Recent  Labs  09/30/15 0830 09/02/16 1513  NA 141  --   K 3.0*  --   GLUCOSE 97  --   CREATININE  --  0.70    LIVER FUNCTION TESTS: No results for input(s): BILITOT, AST, ALT, ALKPHOS, PROT, ALBUMIN in the last 8760 hours.  Assessment:  1. Renal cell carcinoma of the left side, s/p cryoablation on 03-15-15  [name] [name] is doing very well.  His only complaint is urinary leakage for which we discussed following up with his urologist, Dr. Erlene Quan, for this.  His repeat CT scan reveals the ablation site with no recurrence of progression of disease.  He will return for follow up in one year with a repeat CT of the abd/pel with contrast prior to his visit with Dr. Annamaria Boots.  Signed: Henreitta Cea 09/08/2016, 2:11 PM   Please refer to Dr. Fritz Pickerel attestation of this note for management and plan.

## 2016-09-24 ENCOUNTER — Other Ambulatory Visit: Payer: Self-pay | Admitting: Family Medicine

## 2016-09-24 DIAGNOSIS — G459 Transient cerebral ischemic attack, unspecified: Secondary | ICD-10-CM

## 2016-10-08 ENCOUNTER — Ambulatory Visit: Payer: Medicare Other

## 2016-10-21 ENCOUNTER — Ambulatory Visit
Admission: RE | Admit: 2016-10-21 | Discharge: 2016-10-21 | Disposition: A | Discharge status: Home / Self Care | Payer: Medicare Other | Source: Ambulatory Visit | Attending: Family Medicine | Admitting: Family Medicine

## 2016-10-21 DIAGNOSIS — G459 Transient cerebral ischemic attack, unspecified: Secondary | ICD-10-CM | POA: Diagnosis present

## 2017-08-17 ENCOUNTER — Other Ambulatory Visit (HOSPITAL_COMMUNITY): Payer: Self-pay | Admitting: Interventional Radiology

## 2017-08-17 ENCOUNTER — Other Ambulatory Visit: Payer: Self-pay | Admitting: *Deleted

## 2017-08-17 DIAGNOSIS — N2889 Other specified disorders of kidney and ureter: Secondary | ICD-10-CM

## 2017-08-25 ENCOUNTER — Ambulatory Visit
Admission: RE | Admit: 2017-08-25 | Discharge: 2017-08-25 | Disposition: A | Discharge status: Home / Self Care | Payer: Medicare Other | Source: Ambulatory Visit | Attending: Interventional Radiology | Admitting: Interventional Radiology

## 2017-08-25 ENCOUNTER — Ambulatory Visit: Admission: RE | Admit: 2017-08-25 | Payer: Medicare Other | Source: Ambulatory Visit

## 2017-08-25 DIAGNOSIS — I700 Atherosclerosis of aorta: Secondary | ICD-10-CM | POA: Diagnosis not present

## 2017-08-25 DIAGNOSIS — Z85528 Personal history of other malignant neoplasm of kidney: Secondary | ICD-10-CM | POA: Diagnosis not present

## 2017-08-25 DIAGNOSIS — N2889 Other specified disorders of kidney and ureter: Secondary | ICD-10-CM | POA: Insufficient documentation

## 2017-08-25 DIAGNOSIS — R911 Solitary pulmonary nodule: Secondary | ICD-10-CM | POA: Insufficient documentation

## 2017-08-25 DIAGNOSIS — Z9889 Other specified postprocedural states: Secondary | ICD-10-CM | POA: Insufficient documentation

## 2017-08-25 LAB — POCT I-STAT CREATININE: CREATININE: 0.8 mg/dL (ref 0.61–1.24)

## 2017-08-25 MED ORDER — IOPAMIDOL (ISOVUE-370) INJECTION 76%
100.0000 mL | Freq: Once | INTRAVENOUS | Status: AC | PRN
  Administered 2017-08-25: 100 mL via INTRAVENOUS

## 2017-08-31 ENCOUNTER — Inpatient Hospital Stay
Admission: RE | Admit: 2017-08-31 | Discharge: 2017-08-31 | Disposition: A | Discharge status: Home / Self Care | Payer: Medicare Other | Source: Ambulatory Visit | Attending: Interventional Radiology | Admitting: Interventional Radiology

## 2017-08-31 DIAGNOSIS — I700 Atherosclerosis of aorta: Secondary | ICD-10-CM | POA: Insufficient documentation

## 2017-08-31 NOTE — Progress Notes (Signed)
Patient ID: [name] [name], male   DOB: 02/17/1932, 84 y.o.   MRN: [id number]   Pt was unable to make todays' appt.  F/u Abd CT shows stable left renal cryoablation site. However, There is a 51mm enlarging lingula nodule compared to last year concerning for malignancy.  Rec complete chest CT with contrast and referral to Dr Genevive Bi at [facility] for eval of the lung nodule.

## 2017-09-01 ENCOUNTER — Other Ambulatory Visit: Payer: Self-pay | Admitting: Family Medicine

## 2017-09-01 DIAGNOSIS — R911 Solitary pulmonary nodule: Secondary | ICD-10-CM

## 2017-09-08 ENCOUNTER — Ambulatory Visit
Admission: RE | Admit: 2017-09-08 | Discharge: 2017-09-08 | Disposition: A | Discharge status: Home / Self Care | Payer: Medicare Other | Source: Ambulatory Visit | Attending: Family Medicine | Admitting: Family Medicine

## 2017-09-08 DIAGNOSIS — I700 Atherosclerosis of aorta: Secondary | ICD-10-CM | POA: Insufficient documentation

## 2017-09-08 DIAGNOSIS — J439 Emphysema, unspecified: Secondary | ICD-10-CM | POA: Insufficient documentation

## 2017-09-08 DIAGNOSIS — R911 Solitary pulmonary nodule: Secondary | ICD-10-CM | POA: Diagnosis present

## 2017-09-08 DIAGNOSIS — I2510 Atherosclerotic heart disease of native coronary artery without angina pectoris: Secondary | ICD-10-CM | POA: Insufficient documentation

## 2017-09-10 ENCOUNTER — Encounter: Payer: Self-pay | Admitting: Cardiothoracic Surgery

## 2017-09-10 ENCOUNTER — Ambulatory Visit: Payer: Medicare Other | Admitting: Cardiothoracic Surgery

## 2017-09-10 DIAGNOSIS — R911 Solitary pulmonary nodule: Secondary | ICD-10-CM

## 2017-09-10 NOTE — Progress Notes (Signed)
[id number]

## 2017-09-10 NOTE — Patient Instructions (Signed)
We will send a referral for you to see Dr. Rogue Bussing and Dr. Baruch Gouty for them to let you know what they recommend.  Please give [place] a call in case you have any questions or concerns.  We have scheduled you for a PET Scan from your Head to your Thighs. This has been scheduled on 09/17/2017 at the [place] location. Please arrive at 9:00 AM with a list of your medications, a photo ID, and insurance card.  Please have nothing to eat or drink after midnight prior to your PET Scan.   If you are diabetic, skip your morning dose of medication.  If you need to reschedule your Scan, you may do so by calling [phone].

## 2017-09-10 NOTE — Progress Notes (Signed)
Patient ID: [name] [name], male   DOB: 1932/01/31, 84 y.o.   MRN: [id number]  Chief Complaint  Patient presents with  . New Patient (Initial Visit)     lung nodule    Referred By Dr. Netty Starring Reason for Referral bilateral lung mass  HPI Location, Quality, Duration, Severity, Timing, Context, Modifying Factors, Associated Signs and Symptoms.  [name] [name] is a 84 y.o. male.  Approximately 3 years ago this patient underwent a percutaneous radiofrequency ablation of a left renal cell carcinoma.  He has been followed intermittently with serial abdominal scans.  The ablation procedure went well but as part of this evaluation some of the CT scans included the lungs.  On a recent CT scan a small 2-3 mm left upper lobe nodule had increased to 8 mm over the course of a year.  It was felt that this was a significant growth and a subsequent dedicated chest CT was obtained.  This revealed a right upper lobe nodule measuring about 2-3 cm in size that was worrisome for a malignancy.  There is also an area in the right lower lobe that was suggestive more of a pneumonia.  The nodule in the lingular aspect of the left upper lobe was also better characterized and was consistent with either a bronchogenic carcinoma or a renal metastasis.  The patient has a previous history of smoking.  He quit about 40 years ago.  He suffered a stroke a couple of years ago and has been left with some right sided weakness.  He does have some difficulty in ambulating but gets along quite well for his age.  He denied any recent fevers or chills.  He denied any hemoptysis.  He does have a sister who has a lung nodule and was told that this was benign.  He does have a brother with lung cancer.  He is retired from the [employer].  He states that he has no known exposures.   Past Medical History:  Diagnosis Date  . Arthritis   . Cancer ([place]) 45 years ago melanoma  . Enlarged prostate 11/21/14  . GERD (gastroesophageal reflux disease)    . History of kidney stones   . Hypertension   . Renal cancer ([place])   . Stroke [facility]) [id number]   slight weakness RT side    Past Surgical History:  Procedure Laterality Date  . CHOLECYSTECTOMY N/A   . CYSTOSCOPY WITH LITHOLAPAXY N/A 09/30/2015   Procedure: CYSTOSCOPY WITH LITHOLAPAXY;  Surgeon: Hollice Espy, MD;  Location: [facility] ORS;  Service: Urology;  Laterality: N/A;  . HOLEP-LASER ENUCLEATION OF THE PROSTATE WITH MORCELLATION N/A 09/30/2015   Procedure: HOLEP-LASER ENUCLEATION OF THE PROSTATE WITH MORCELLATION;  Surgeon: Hollice Espy, MD;  Location: [facility] ORS;  Service: Urology;  Laterality: N/A;  . IR GENERIC HISTORICAL  09/08/2016   IR RADIOLOGIST EVAL & MGMT 09/08/2016 Greggory Keen, MD [facility] INTERV RAD  . JOINT REPLACEMENT Bilateral 4-5 years ago   kness replaced   . LAPAROTOMY  2004   1 week after choley "kinicked bile duct"  . SKIN CANCER EXCISION  1973    Family History  Problem Relation Age of Onset  . Cancer Brother   . Cancer Sister   . Cancer Sister   . Cancer - Prostate Brother     Social History Social History   Tobacco Use  . Smoking status: Former Smoker    Types: Cigarettes    Last attempt to quit: 11/20/1968    Years since quitting:  48.8  . Smokeless tobacco: Never Used  Substance Use Topics  . Alcohol use: No    Alcohol/week: 0.0 oz  . Drug use: No    No Known Allergies  Current Outpatient Medications  Medication Sig Dispense Refill  . amLODipine (NORVASC) 5 MG tablet Take 5 mg by mouth every morning.     Marland Kitchen aspirin 81 MG tablet Take 81 mg by mouth every other day.     Marland Kitchen atorvastatin (LIPITOR) 20 MG tablet Take 20 mg by mouth daily.    . beta carotene 25000 UNIT capsule Take 25,000 Units by mouth daily.    Marland Kitchen lisinopril (PRINIVIL,ZESTRIL) 40 MG tablet Take 40 mg by mouth every morning.     . metoprolol (LOPRESSOR) 50 MG tablet Take 50 mg by mouth 2 (two) times daily.    . potassium chloride SA (K-DUR,KLOR-CON) 20 MEQ tablet Take 20 mEq by mouth  2 (two) times daily.    Marland Kitchen zinc gluconate 50 MG tablet Take 50 mg by mouth daily.     No current facility-administered medications for this visit.       Review of Systems A complete review of systems was asked and was negative except for the following positive findings skin lesion  Blood pressure (!) 152/75, pulse 67, temperature 98.3 F (36.8 C), temperature source Oral, height 5\' 8"  (1.727 m), weight 218 lb (98.9 kg), SpO2 96 %.  Physical Exam CONSTITUTIONAL:  Pleasant, well-developed, well-nourished, and in no acute distress. EYES: Pupils equal and reactive to light, Sclera non-icteric EARS, NOSE, MOUTH AND THROAT:  The oropharynx was clear.  Dentition is good repair.  Oral mucosa pink and moist. LYMPH NODES:  Lymph nodes in the neck and axillae were normal RESPIRATORY:  Lungs were clear.  Normal respiratory effort without pathologic use of accessory muscles of respiration CARDIOVASCULAR: Heart was regular without murmurs.  There were no carotid bruits. GI: The abdomen was soft, nontender, and nondistended. There were no palpable masses. There was no hepatosplenomegaly. There were normal bowel sounds in all quadrants.  There was a umbilical hernia GU:  Rectal deferred.   MUSCULOSKELETAL:  Normal muscle strength and tone.  No clubbing or cyanosis.   SKIN:  There were no pathologic skin lesions.  There were no nodules on palpation. NEUROLOGIC:  Sensation is normal.  Cranial nerves are grossly intact. PSYCH:  Oriented to person, place and time.  Mood and affect are normal.  Data Reviewed Multiple CT scans of the chest and abdomen  I have personally reviewed the patient's imaging, laboratory findings and medical records.    Assessment    Bilateral lung lesions the most worrisome being in the right upper lobe and left upper lobe.    Plan    The patient was accompanied today by his son.  Given his advanced age and multiple bilateral lesions suggestive of a malignancy I would like  to elicit the consultation with our oncologist.  I suspect that the lesion in the left upper lobe is not amenable to percutaneous biopsy given its location next to the heart.  I will obtain a PET scan and a referral to our oncologist.  I did not make a return visit for the patient has I am sure we will be discussing his care in the near future.       Nestor Lewandowsky, MD 09/10/2017, 10:48 AM

## 2017-09-14 ENCOUNTER — Telehealth: Payer: Self-pay

## 2017-09-14 NOTE — Telephone Encounter (Signed)
Patient called this morning and left a message on the answering machine stating that he cancelled his appointment with Dr. Rogue Bussing and PET Scan. He stated that he will continue his care at the [place]. Dr. Genevive Bi was informed about this.

## 2017-09-17 ENCOUNTER — Telehealth: Payer: Self-pay | Admitting: Cardiothoracic Surgery

## 2017-09-17 ENCOUNTER — Ambulatory Visit: Payer: Medicare Other

## 2017-09-17 NOTE — Telephone Encounter (Signed)
Patients son called and said his father was going to go through the [place] for the rest of his treatments and surgery.

## 2017-09-20 ENCOUNTER — Ambulatory Visit: Payer: Medicare Other | Admitting: Internal Medicine

## 2017-12-21 ENCOUNTER — Telehealth: Payer: Self-pay | Admitting: Urology

## 2017-12-21 NOTE — Telephone Encounter (Signed)
PET scan reviewed.  Findings within the prostate are nonspecific and could be result of infection or cancer.  This can also be where urine pools within his prostatic fossa following surgery although this is less likely.  I have not seen [name] [name] in over 2 years.  He has not had a recent PSA or rectal exam as far as I am aware.  As such, is very difficult to interpret this PET scan.  I am happy to see him back for follow-up to discuss/evaluate this further.  Hollice Espy, MD

## 2017-12-21 NOTE — Telephone Encounter (Signed)
Patient's son Jonni Sanger sent you a CD of the patient's PET scan that he had done at the [place] because he wanted you to take a look at it because they think they found an abnormal spot on his prostate? He wants you to say yes or no and if the scan is ok or does he need to be seen by you for follow up? I will put it on your desk.   Sharyn Lull

## 2017-12-22 NOTE — Telephone Encounter (Signed)
Ok thanks, He has opted for an Probation officer

## 2017-12-24 ENCOUNTER — Encounter: Payer: Self-pay | Admitting: Urology

## 2017-12-24 ENCOUNTER — Ambulatory Visit (INDEPENDENT_AMBULATORY_CARE_PROVIDER_SITE_OTHER): Payer: Medicare Other | Admitting: Urology

## 2017-12-24 VITALS — BP 147/78 | HR 67 | Ht 68.0 in | Wt 219.0 lb

## 2017-12-24 DIAGNOSIS — N402 Nodular prostate without lower urinary tract symptoms: Secondary | ICD-10-CM | POA: Diagnosis not present

## 2017-12-24 DIAGNOSIS — R350 Frequency of micturition: Secondary | ICD-10-CM | POA: Diagnosis not present

## 2017-12-24 DIAGNOSIS — N401 Enlarged prostate with lower urinary tract symptoms: Secondary | ICD-10-CM | POA: Diagnosis not present

## 2017-12-24 NOTE — Progress Notes (Signed)
12/24/2017 1:21 PM   [name] [name] 1931/08/22 [id number]  Referring provider: Dion Body, MD [facility] [place], [place] [address]  Chief Complaint  Patient presents with  . Follow-up    HPI: 84 yo with personal history of BPH who returns today after abnormal PET scan showing a prostate abnormality.  He underwent PET scan at the [facility] for further evaluation of a pulmonary nodule.  Ultimately, he is elected to follow this nodule due to its location and high risk for surgery/biopsy.  Incidentally on this study, he was noted to have metabolic activity in his prostate and returns today to discuss this.  He has a personal history of BPH with bladder outlet obstruction as well as instability who previously underwent holmium laser enucleation of the prostate in 2017.  Surgical pathology was benign.  The median lobe and left lateral lobe were enucleated, right lobe left in place to help him maintain continence.   He did have preoperative urodynamics which did indicate obstruction with high bladder pressures, and low peak flow of 7 and also per second. He also had some significant instability and a small bladder capacity.  He does have a history of urinary retention.    Cysto 11/2014 showed trabeculated bladder with significantly enlarged prostate, median lobe.  TRUS vol 76 g at that time.    He feels that he has been emptying his bladder since surgery.  He does have persistent baseline urinary frequency, urgency, and what sounds like urge incontinence.  He also has fecal incontinence.  He wears a diaper for this.  He does have nocturia x6-7 W which is improved dramatically from preop.  No weight loss or bone pain.  He is not currently taking BPH meds.  No recent PSA.  PMH: Past Medical History:  Diagnosis Date  . Arthritis   . Cancer ([place]) 45 years ago melanoma  . Enlarged prostate 11/21/14  . GERD (gastroesophageal reflux disease)   . History of kidney  stones   . Hypertension   . Renal cancer ([place])   . Stroke [facility]) [id number]   slight weakness RT side    Surgical History: Past Surgical History:  Procedure Laterality Date  . CHOLECYSTECTOMY N/A   . CYSTOSCOPY WITH LITHOLAPAXY N/A 09/30/2015   Procedure: CYSTOSCOPY WITH LITHOLAPAXY;  Surgeon: Hollice Espy, MD;  Location: [facility] ORS;  Service: Urology;  Laterality: N/A;  . HOLEP-LASER ENUCLEATION OF THE PROSTATE WITH MORCELLATION N/A 09/30/2015   Procedure: HOLEP-LASER ENUCLEATION OF THE PROSTATE WITH MORCELLATION;  Surgeon: Hollice Espy, MD;  Location: [facility] ORS;  Service: Urology;  Laterality: N/A;  . IR GENERIC HISTORICAL  09/08/2016   IR RADIOLOGIST EVAL & MGMT 09/08/2016 Greggory Keen, MD [facility] INTERV RAD  . JOINT REPLACEMENT Bilateral 4-5 years ago   kness replaced   . LAPAROTOMY  2004   1 week after choley "kinicked bile duct"  . SKIN CANCER EXCISION  1973    Home Medications:  Allergies as of 12/24/2017   No Known Allergies     Medication List        Accurate as of 12/24/17 11:59 PM. Always use your most recent med list.          amLODipine 5 MG tablet Commonly known as:  NORVASC Take 5 mg by mouth every morning.   aspirin 81 MG tablet Take 81 mg by mouth every other day.   atorvastatin 20 MG tablet Commonly known as:  LIPITOR Take 20 mg by mouth daily.  beta carotene 25000 UNIT capsule Take 25,000 Units by mouth daily.   lisinopril 40 MG tablet Commonly known as:  PRINIVIL,ZESTRIL Take 40 mg by mouth every morning.   metoprolol tartrate 50 MG tablet Commonly known as:  LOPRESSOR Take 50 mg by mouth 2 (two) times daily.   potassium chloride SA 20 MEQ tablet Commonly known as:  K-DUR,KLOR-CON Take 20 mEq by mouth 2 (two) times daily.   zinc gluconate 50 MG tablet Take 50 mg by mouth daily.       Allergies: No Known Allergies  Family History: Family History  Problem Relation Age of Onset  . Cancer Brother   . Cancer Sister   . Cancer Sister     . Cancer - Prostate Brother     Social History:  reports that he quit smoking about 49 years ago. His smoking use included cigarettes. He has never used smokeless tobacco. He reports that he does not drink alcohol or use drugs.  ROS: UROLOGY Frequent Urination?: No Hard to postpone urination?: No Burning/pain with urination?: No Get up at night to urinate?: No Leakage of urine?: Yes Urine stream starts and stops?: No Trouble starting stream?: No Do you have to strain to urinate?: No Blood in urine?: No Urinary tract infection?: No Sexually transmitted disease?: No Injury to kidneys or bladder?: No Painful intercourse?: No Weak stream?: No Erection problems?: No Penile pain?: No  Gastrointestinal Nausea?: No Vomiting?: No Indigestion/heartburn?: No Diarrhea?: No Constipation?: No  Constitutional Fever: No Night sweats?: No Weight loss?: No Fatigue?: No  Skin Skin rash/lesions?: No Itching?: No  Eyes Blurred vision?: No Double vision?: No  Ears/Nose/Throat Sore throat?: No Sinus problems?: No  Hematologic/Lymphatic Swollen glands?: No Easy bruising?: No  Cardiovascular Leg swelling?: Yes Chest pain?: No  Respiratory Cough?: No Shortness of breath?: No  Endocrine Excessive thirst?: No  Musculoskeletal Back pain?: No Joint pain?: No  Neurological Headaches?: No Dizziness?: No  Psychologic Depression?: No Anxiety?: No  Physical Exam: BP (!) 147/78   Pulse 67   Ht 5\' 8"  (1.727 m)   Wt 219 lb (99.3 kg)   BMI 33.30 kg/m   Constitutional:  Alert and oriented, No acute distress.  Hard of hearing.  Ambulating with cane.  Unaccompanied today. HEENT: [place] AT, moist mucus membranes.  Trachea midline, no masses. Cardiovascular: No clubbing, cyanosis, or edema. Respiratory: Normal respiratory effort, no increased work of breathing. GI: Abdomen is soft, nontender, nondistended, no abdominal masses.  Umbilical hernia appreciated. Rectal:  Significant fecal material around anus, slightly decreased sphincter tone, enlarged prostate with large 1 cm midline nodule just left of the midline. Skin: No rashes, bruises or suspicious lesions. Neurologic: Grossly intact, no focal deficits, moving all 4 extremities. Psychiatric: Normal mood and affect.  Laboratory Data: Lab Results  Component Value Date   WBC 8.2 09/20/2015   HGB 15.6 09/30/2015   HCT 46.0 09/30/2015   MCV 82.2 09/20/2015   PLT 189 09/20/2015    Lab Results  Component Value Date   CREATININE 0.80 08/25/2017    Urinalysis UA from 08/31/2016 completely negative, no bladder infection.  Pertinent Imaging: PVR 46 cc  Disc for PET scan was personally reviewed today, does appear that there is a large metabolically active area within the prostate as well as in the lung.  Assessment & Plan:    1. Prostate nodule Abnormality within the prostate on on PET scan likely corresponds to palpable nodule on exam today No recent PSA Clinical significance of this cancer  unknown Given his age and comorbidities, unclear at this point how to proceed PSA drawn today, will have further discussion about whether to pursue any further work-up based on his PSA results He is agreeable with this plan - BLADDER SCAN AMB NON-IMAGING - PSA  2. Benign prostatic hyperplasia with urinary frequency Adequate bladder emptying today Personal history of overactivity and small bladder capacity likely contributing to symptoms We will address this further at next visit   Return in about 2 weeks (around 01/07/2018) for discuss PSA/ prostate nodules.  Hollice Espy, MD  [facility] [address], [place], [place] [address] [phone]

## 2017-12-25 LAB — PSA: PROSTATE SPECIFIC AG, SERUM: 1.2 ng/mL (ref 0.0–4.0)

## 2018-01-11 ENCOUNTER — Ambulatory Visit: Payer: Medicare Other | Admitting: Urology

## 2018-01-13 ENCOUNTER — Ambulatory Visit (INDEPENDENT_AMBULATORY_CARE_PROVIDER_SITE_OTHER): Payer: Medicare Other | Admitting: Urology

## 2018-01-13 ENCOUNTER — Encounter: Payer: Self-pay | Admitting: Urology

## 2018-01-13 VITALS — BP 135/75 | HR 65 | Ht 68.0 in | Wt 215.0 lb

## 2018-01-13 DIAGNOSIS — R350 Frequency of micturition: Secondary | ICD-10-CM

## 2018-01-13 DIAGNOSIS — N402 Nodular prostate without lower urinary tract symptoms: Secondary | ICD-10-CM | POA: Diagnosis not present

## 2018-01-13 DIAGNOSIS — N401 Enlarged prostate with lower urinary tract symptoms: Secondary | ICD-10-CM | POA: Diagnosis not present

## 2018-01-13 NOTE — Progress Notes (Addendum)
01/13/2018 1:59 PM   [name] [name] [birth year] [id number]  Referring provider: Dion Body, MD [facility] [place], [place] [address]  Chief Complaint  Patient presents with  . Benign Prostatic Hypertrophy    HPI: 84 yo male who returns to the office today to discuss his PSA results.  HE underwent PET scan at the [facility] for further evaluation of a pulmonary nodule last month.  Ultimately, he is elected to follow this nodule due to its location and high risk for surgery/biopsy.  Incidentally on this study, he was noted to have metabolic activity in his prostate.  He was also noted to have a nodule on rectal exam in the office on 12/24/2017 but not recent correlating PSA.  PSA was drawn on 12/24/2017 and noted to be 1.2.  He has no recent previous PSAs for comparison.  He has a personal history of BPH with bladder outlet obstruction as well as instability who previously underwent holmium laser enucleation of the prostate in 2017.  Surgical pathology was benign.  The median lobe and left lateral lobe were enucleated, right lobe left in place to help him maintain continence.   In terms of urinary issues, he does have frequency urgency and occasional urge incontinence as well as fecal incontinence.  He wears depends.  He does feel he empties his bladder since surgery.  He gets up a significant amount at night to void but this is improved dramatically from preop.    He is not currently on any BPH meds.  He is overall satisfied with hi urinary status.    He denies any weight loss or bone pain.  He does have multiple medical comorbidities including history of stroke, pulmonary nodule, personal history of melanoma, history of left upper pole RCC status post cryoablation 2016.   PMH: Past Medical History:  Diagnosis Date  . Arthritis   . Cancer ([place]) 45 years ago melanoma  . Enlarged prostate 11/21/14  . GERD (gastroesophageal reflux disease)   . History of kidney  stones   . Hypertension   . Renal cancer ([place])   . Stroke [facility]) [id number]   slight weakness RT side    Surgical History: Past Surgical History:  Procedure Laterality Date  . CHOLECYSTECTOMY N/A   . CYSTOSCOPY WITH LITHOLAPAXY N/A 09/30/2015   Procedure: CYSTOSCOPY WITH LITHOLAPAXY;  Surgeon: Hollice Espy, MD;  Location: [facility] ORS;  Service: Urology;  Laterality: N/A;  . HOLEP-LASER ENUCLEATION OF THE PROSTATE WITH MORCELLATION N/A 09/30/2015   Procedure: HOLEP-LASER ENUCLEATION OF THE PROSTATE WITH MORCELLATION;  Surgeon: Hollice Espy, MD;  Location: [facility] ORS;  Service: Urology;  Laterality: N/A;  . IR GENERIC HISTORICAL  09/08/2016   IR RADIOLOGIST EVAL & MGMT 09/08/2016 Greggory Keen, MD [facility] INTERV RAD  . JOINT REPLACEMENT Bilateral 4-5 years ago   kness replaced   . LAPAROTOMY  2004   1 week after choley "kinicked bile duct"  . SKIN CANCER EXCISION  1973    Home Medications:  Allergies as of 01/13/2018   No Known Allergies     Medication List        Accurate as of 01/13/18 11:59 PM. Always use your most recent med list.          amLODipine 5 MG tablet Commonly known as:  NORVASC Take 5 mg by mouth every morning.   aspirin 81 MG tablet Take 81 mg by mouth every other day.   atorvastatin 20 MG tablet Commonly known as:  LIPITOR Take 20 mg by mouth daily.   beta carotene 25000 UNIT capsule Take 25,000 Units by mouth daily.   lisinopril 40 MG tablet Commonly known as:  PRINIVIL,ZESTRIL Take 40 mg by mouth every morning.   metoprolol tartrate 50 MG tablet Commonly known as:  LOPRESSOR Take 50 mg by mouth 2 (two) times daily.   potassium chloride SA 20 MEQ tablet Commonly known as:  K-DUR,KLOR-CON Take 20 mEq by mouth 2 (two) times daily.   zinc gluconate 50 MG tablet Take 50 mg by mouth daily.       Allergies: No Known Allergies  Family History: Family History  Problem Relation Age of Onset  . Cancer Brother   . Cancer Sister   . Cancer Sister    . Cancer - Prostate Brother     Social History:  reports that he quit smoking about 49 years ago. His smoking use included cigarettes. He has never used smokeless tobacco. He reports that he does not drink alcohol or use drugs.  ROS: UROLOGY Frequent Urination?: No Hard to postpone urination?: No Burning/pain with urination?: No Get up at night to urinate?: Yes Leakage of urine?: Yes Urine stream starts and stops?: No Trouble starting stream?: No Do you have to strain to urinate?: No Blood in urine?: No Urinary tract infection?: No Sexually transmitted disease?: No Injury to kidneys or bladder?: No Painful intercourse?: No Weak stream?: No Erection problems?: No Penile pain?: No  Gastrointestinal Nausea?: No Vomiting?: No Indigestion/heartburn?: No Diarrhea?: No Constipation?: No  Constitutional Fever: No Night sweats?: No Weight loss?: No Fatigue?: No  Skin Skin rash/lesions?: No Itching?: No  Eyes Blurred vision?: No Double vision?: No  Ears/Nose/Throat Sore throat?: No Sinus problems?: No  Hematologic/Lymphatic Swollen glands?: No Easy bruising?: No  Cardiovascular Leg swelling?: Yes Chest pain?: No  Respiratory Cough?: No Shortness of breath?: No  Endocrine Excessive thirst?: No  Musculoskeletal Back pain?: No Joint pain?: No  Neurological Headaches?: No Dizziness?: No  Psychologic Depression?: No Anxiety?: No  Physical Exam: BP 135/75   Pulse 65   Ht 5\' 8"  (1.727 m)   Wt 215 lb (97.5 kg)   BMI 32.69 kg/m   Constitutional:  Alert and oriented, No acute distress.  Endorses issues with short-term memory. HEENT: [place] AT, moist mucus membranes.  Trachea midline, no masses. Cardiovascular: No clubbing, cyanosis, or edema. Respiratory: Normal respiratory effort, no increased work of breathing. Skin: No rashes, bruises or suspicious lesions. Neurologic: Grossly intact, no focal deficits, moving all 4 extremities.   Psychiatric:  Normal mood and affect.  Laboratory Data: Lab Results  Component Value Date   WBC 8.2 09/20/2015   HGB 15.6 09/30/2015   HCT 46.0 09/30/2015   MCV 82.2 09/20/2015   PLT 189 09/20/2015    Lab Results  Component Value Date   CREATININE 0.80 08/25/2017   Component     Latest Ref Rng & Units 12/24/2017  Prostate Specific Ag, Serum     0.0 - 4.0 ng/mL 1.2    Urinalysis N/a  Pertinent Imaging: No new interval imaging, previous PET scan personally reviewed on disk  Assessment & Plan:    1. Prostate nodule Discrete prostate nodule which enhances on PET scan PSA is relatively low which is reassuring We do lengthy discussion today, patient may have a localized prostate cancer but is otherwise asymptomatic at this time We discussed the benefit of treating prostate cancer is primarily in those his life expectancy is greater than 10 years, otherwise active  surveillance and treating for symptomatic disease is preferred We discussed watchful waiting in detail today Alternatives including proceeding with prostate biopsy was discussed but will likely not change recommendations We also briefly discussed androgen deprivation therapy today as an option but has significant side effects including hot flashes, weight gain, changes in energy and libido, and long-term cardiovascular complications At this point time, he is quite comfortable with watchful waiting We will see him again in 6 months with PSA He asked that we write out a conversation today as he often has issues with a short-term memory-he was advised to sign up for my chart and will have a digital copy of today's visit  I will also reach out to his son to which she is agreeable All questions answered  2. Benign prostatic hyperplasia with urinary frequency Unchanged from baseline Not interested in pharmacotherapy at this time   Return in about 6 months (around 07/15/2018) for PSA/ IPSS/ symptoms review, please give code to activate  my chart.  Hollice Espy, MD  [facility] [address] [place], [place] [address] [phone]  I spent 15 min with this patient of which greater than 50% was spent in counseling and coordination of care with the patient.   Addendum: I did reach out to his son, Randall Hiss per the patient's request to review the findings and recommendations.  All questions were answered.  He is in agreement with the plan.

## 2018-07-26 ENCOUNTER — Ambulatory Visit: Payer: Medicare Other | Admitting: Urology

## 2018-08-03 ENCOUNTER — Ambulatory Visit: Payer: Medicare Other | Admitting: Urology

## 2018-08-03 NOTE — Progress Notes (Signed)
08/04/2018 12:16 PM   [name] [name] Nov 21, 1931 [id number]  Referring provider: Dion Body, MD [facility] [place], [place] [address]  Chief Complaint  Patient presents with  . Prostate nodule    6 month follow up    HPI: [name] [name] is a 85 yo M with a history of prostate nodule and BPH with urinary frequency presents today for a PSA, I PSS, and symptoms review.   His urinary symptoms are stable s/p HoLEP. He is pleased with his urinary symptoms.  PSA was not drawn prior to appt so PSA will be taken today.    He reports he is getting care at [facility] for lung cancer and his pulmonologist recommended surveillance. He reports of pain in bones due to his arthritis.   Previous History:  HE underwent PET scan at the [facility] for further evaluation of a pulmonary nodule last month. Ultimately, he is elected to follow this nodule due to its location and high risk for surgery/biopsy. Incidentally on this study, he was noted to have metabolic activity in his prostate.  He was also noted to have a nodule on rectal exam in the office on 12/24/2017 but not recent correlating PSA.  PSA was drawn on 12/24/2017 and noted to be 1.2.  He has no recent previous PSAs for comparison.  He has a personal history of BPH with bladder outlet obstruction as well as instability who previously underwent holmium laser enucleation of the prostate in 2017. Surgical pathology was benign. The median lobe and left lateral lobe were enucleated, right lobe left in place to help him maintain continence.   He does have multiple medical comorbidities including history of stroke, pulmonary nodule, personal history of melanoma, history of left upper pole RCC status post cryoablation 2016.  IPSS    Row Name 08/04/18 1100         International Prostate Symptom Score   How often have you had the sensation of not emptying your bladder?  Less than half the time     How often have you had to  urinate less than every two hours?  About half the time     How often have you found you stopped and started again several times when you urinated?  Less than half the time     How often have you found it difficult to postpone urination?  Less than half the time     How often have you had a weak urinary stream?  About half the time     How often have you had to strain to start urination?  Less than half the time     How many times did you typically get up at night to urinate?  3 Times     Total IPSS Score  17       Quality of Life due to urinary symptoms   If you were to spend the rest of your life with your urinary condition just the way it is now how would you feel about that?  Mostly Satisfied       Score:  1-7 Mild 8-19 Moderate 20-35 Severe  PMH: Past Medical History:  Diagnosis Date  . Arthritis   . Cancer ([place]) 45 years ago melanoma  . Enlarged prostate 11/21/14  . GERD (gastroesophageal reflux disease)   . History of kidney stones   . Hypertension   . Renal cancer ([place])   . Stroke [facility]) [id number]   slight weakness RT  side    Surgical History: Past Surgical History:  Procedure Laterality Date  . CHOLECYSTECTOMY N/A   . CYSTOSCOPY WITH LITHOLAPAXY N/A 09/30/2015   Procedure: CYSTOSCOPY WITH LITHOLAPAXY;  Surgeon: Hollice Espy, MD;  Location: [facility] ORS;  Service: Urology;  Laterality: N/A;  . HOLEP-LASER ENUCLEATION OF THE PROSTATE WITH MORCELLATION N/A 09/30/2015   Procedure: HOLEP-LASER ENUCLEATION OF THE PROSTATE WITH MORCELLATION;  Surgeon: Hollice Espy, MD;  Location: [facility] ORS;  Service: Urology;  Laterality: N/A;  . IR GENERIC HISTORICAL  09/08/2016   IR RADIOLOGIST EVAL & MGMT 09/08/2016 Greggory Keen, MD [facility] INTERV RAD  . JOINT REPLACEMENT Bilateral 4-5 years ago   kness replaced   . LAPAROTOMY  2004   1 week after choley "kinicked bile duct"  . SKIN CANCER EXCISION  1973    Home Medications:  Allergies as of 08/04/2018   No Known Allergies       Medication List       Accurate as of August 04, 2018 12:16 PM. Always use your most recent med list.        amLODipine 5 MG tablet Commonly known as:  NORVASC Take 5 mg by mouth every morning.   aspirin 81 MG tablet Take 81 mg by mouth every other day.   atorvastatin 20 MG tablet Commonly known as:  LIPITOR Take 20 mg by mouth daily.   beta carotene 25000 UNIT capsule Take 25,000 Units by mouth daily.   colestipol 1 g tablet Commonly known as:  COLESTID Take by mouth.   lisinopril 40 MG tablet Commonly known as:  PRINIVIL,ZESTRIL Take 40 mg by mouth every morning.   meloxicam 15 MG tablet Commonly known as:  MOBIC TAKE 1 TABLET BY MOUTH ONCE DAILY WITH BREAKFAST   meloxicam 15 MG tablet Commonly known as:  MOBIC Take by mouth.   metoprolol tartrate 50 MG tablet Commonly known as:  LOPRESSOR Take 50 mg by mouth 2 (two) times daily.   potassium chloride SA 20 MEQ tablet Commonly known as:  K-DUR,KLOR-CON Take 20 mEq by mouth 2 (two) times daily.   terbinafine 1 % cream Commonly known as:  LAMISIL Apply topically.   triamcinolone cream 0.5 % Commonly known as:  KENALOG Apply topically.   Zinc 50 MG Tabs Take by mouth.   zinc gluconate 50 MG tablet Take 50 mg by mouth daily.       Allergies: No Known Allergies  Family History: Family History  Problem Relation Age of Onset  . Cancer Brother   . Cancer Sister   . Cancer Sister   . Cancer - Prostate Brother     Social History:  reports that he quit smoking about 49 years ago. His smoking use included cigarettes. He has never used smokeless tobacco. He reports that he does not drink alcohol or use drugs.  ROS: UROLOGY Frequent Urination?: Yes Hard to postpone urination?: No Burning/pain with urination?: No Get up at night to urinate?: Yes Leakage of urine?: Yes Urine stream starts and stops?: Yes Trouble starting stream?: No Do you have to strain to urinate?: No Blood in urine?:  No Urinary tract infection?: No Sexually transmitted disease?: No Injury to kidneys or bladder?: No Painful intercourse?: No Weak stream?: No Erection problems?: No Penile pain?: No  Gastrointestinal Nausea?: No Vomiting?: No Indigestion/heartburn?: Yes Diarrhea?: Yes Constipation?: No  Constitutional Fever: No Night sweats?: No Weight loss?: No Fatigue?: No  Skin Skin rash/lesions?: No Itching?: Yes  Eyes Blurred vision?: Yes Double vision?: No  Ears/Nose/Throat Sore throat?: No Sinus problems?: No  Hematologic/Lymphatic Swollen glands?: No Easy bruising?: No  Cardiovascular Leg swelling?: Yes Chest pain?: No  Respiratory Cough?: No Shortness of breath?: No  Endocrine Excessive thirst?: No  Musculoskeletal Back pain?: No Joint pain?: Yes  Neurological Headaches?: No Dizziness?: No  Psychologic Depression?: No Anxiety?: No  Physical Exam: BP 132/69 (BP Location: Left Arm, Patient Position: Sitting)   Pulse 65   Ht 5\' 8"  (1.727 m)   Wt 212 lb (96.2 kg)   BMI 32.23 kg/m   Constitutional:  Alert and oriented, No acute distress.  Accompanied by son today. HEENT: [place] AT, moist mucus membranes.  Trachea midline, no masses. Cardiovascular: No clubbing, cyanosis, or edema. Respiratory: Normal respiratory effort, no increased work of breathing. Skin: No rashes, bruises or suspicious lesions. Neurologic: Grossly intact, no focal deficits, moving all 4 extremities. Psychiatric: Normal mood and affect.  Laboratory Data:  Component     Latest Ref Rng & Units 12/24/2017  Prostate Specific Ag, Serum     0.0 - 4.0 ng/mL 1.2   Assessment & Plan:    1. Prostate nodule  Discrete prostate nodule which enhances on PET scan PSA drawn today - pending; will call with results  We discussed again today that is highly possible that he does have some underlying prostate cancer, however given his low PSA and no evidence of metastatic disease or symptoms, I  would strongly recommend watchful waiting at this point in time rather than pursuing biopsy, ADT, or any other intervention Patient and his son continue to be agreeable this plan, understand he has fairly significant comorbidities   2. BPH with urinary frequency  Unchanged from baseline  No bothersome urinary symptoms Not interested in pharmacotherapy at this time   Return in about 6 months (around 02/02/2019) for PSA prior to appt.Dolores Frame, am acting as a scribe for Dr. Hollice Espy,  I have reviewed the above documentation for accuracy and completeness, and I agree with the above.   Hollice Espy, MD   [facility] [address], [place], [place] [address] [phone]

## 2018-08-04 ENCOUNTER — Encounter: Payer: Self-pay | Admitting: Urology

## 2018-08-04 ENCOUNTER — Ambulatory Visit: Payer: Medicare Other | Admitting: Urology

## 2018-08-04 VITALS — BP 132/69 | HR 65 | Ht 68.0 in | Wt 212.0 lb

## 2018-08-04 DIAGNOSIS — N401 Enlarged prostate with lower urinary tract symptoms: Secondary | ICD-10-CM | POA: Diagnosis not present

## 2018-08-04 DIAGNOSIS — R350 Frequency of micturition: Secondary | ICD-10-CM

## 2018-08-04 DIAGNOSIS — N402 Nodular prostate without lower urinary tract symptoms: Secondary | ICD-10-CM

## 2018-08-05 ENCOUNTER — Telehealth: Payer: Self-pay

## 2018-08-05 LAB — PSA: PROSTATE SPECIFIC AG, SERUM: 1.1 ng/mL (ref 0.0–4.0)

## 2018-08-05 NOTE — Telephone Encounter (Signed)
Called and advised patients son, [name], of PSA results and Dr Cherrie Gauze message per their conversation at the office visit. Reminded him of upcoming July appts.

## 2018-08-05 NOTE — Telephone Encounter (Signed)
-----   Message from Hollice Espy, MD sent at 08/05/2018  7:55 AM EST ----- PSA is stable.  Lets stay the course and continue to follow.  Please call his son with this message per our agreement yesterday.  Hollice Espy, MD

## 2018-08-23 ENCOUNTER — Telehealth: Payer: Self-pay | Admitting: Radiology

## 2018-08-23 ENCOUNTER — Other Ambulatory Visit: Payer: Self-pay | Admitting: Interventional Radiology

## 2018-08-23 DIAGNOSIS — N2889 Other specified disorders of kidney and ureter: Secondary | ICD-10-CM

## 2018-08-23 NOTE — Telephone Encounter (Signed)
Spoke with patient's son, Jonni Sanger.  He states that his father has a lot of physician appointments at present-----[facility] services, pulmonology, and urology.  He would like to defer IR follow up at this time.  Will discuss with Dr. Annamaria Boots and follow up with Jonni Sanger next week.    Phiona Ramnauth Riki Rusk, RN 08/23/2018 3:56 PM

## 2019-01-30 ENCOUNTER — Other Ambulatory Visit: Payer: Medicare Other

## 2019-02-01 ENCOUNTER — Ambulatory Visit: Payer: Medicare Other | Admitting: Urology

## 2019-02-21 ENCOUNTER — Other Ambulatory Visit: Payer: Medicare Other

## 2019-02-21 ENCOUNTER — Other Ambulatory Visit: Payer: Self-pay

## 2019-02-21 DIAGNOSIS — N402 Nodular prostate without lower urinary tract symptoms: Secondary | ICD-10-CM

## 2019-02-22 LAB — PSA: Prostate Specific Ag, Serum: 1.1 ng/mL (ref 0.0–4.0)

## 2019-02-28 ENCOUNTER — Ambulatory Visit: Payer: Medicare Other | Admitting: Urology

## 2019-02-28 ENCOUNTER — Telehealth: Payer: Self-pay | Admitting: Urology

## 2019-02-28 NOTE — Telephone Encounter (Signed)
This patient was a no-show to clinic today.  Interestingly, he got his PSA and the note says that he confirm this appointment.  Please reach out to him/his son to reschedule.  If they prefer a virtual visit, we can accommodate that as well.  Hollice Espy, MD

## 2019-03-01 ENCOUNTER — Encounter: Payer: Self-pay | Admitting: Urology

## 2019-03-22 ENCOUNTER — Ambulatory Visit: Payer: Medicare Other | Admitting: Urology

## 2019-04-25 ENCOUNTER — Ambulatory Visit: Payer: Medicare Other | Admitting: Urology

## 2019-05-17 ENCOUNTER — Ambulatory Visit: Payer: Medicare Other | Admitting: Urology

## 2019-08-03 ENCOUNTER — Ambulatory Visit: Payer: Medicare Other | Attending: Internal Medicine

## 2019-08-03 DIAGNOSIS — Z23 Encounter for immunization: Secondary | ICD-10-CM | POA: Insufficient documentation

## 2019-08-03 NOTE — Progress Notes (Signed)
   Covid-19 Vaccination Clinic  Name:  [name] [name]    MRN: [id number] DOB: 1931/10/22  08/03/2019  [name] [name] was observed post Covid-19 immunization for 30 minutes based on pre-vaccination screening without incidence. He was provided with Vaccine Information Sheet and instruction to access the V-Safe system.   [name] [name] was instructed to call 911 with any severe reactions post vaccine: Marland Kitchen Difficulty breathing  . Swelling of your face and throat  . A fast heartbeat  . A bad rash all over your body  . Dizziness and weakness    Immunizations Administered    Name Date Dose VIS Date Route   Pfizer COVID-19 Vaccine 08/03/2019 11:30 AM 0.3 mL 06/30/2019 Intramuscular   Manufacturer: [employer]   Lot: [id number]   [place]: [id number]

## 2019-08-24 ENCOUNTER — Ambulatory Visit: Payer: Medicare Other | Attending: Internal Medicine

## 2019-08-24 DIAGNOSIS — Z23 Encounter for immunization: Secondary | ICD-10-CM | POA: Insufficient documentation

## 2019-08-24 NOTE — Progress Notes (Signed)
   Covid-19 Vaccination Clinic  Name:  [name] [name]    MRN: [id number] DOB: 03/08/1932  08/24/2019  [name] was observed post Covid-19 immunization for 15 minutes without incidence. He was provided with Vaccine Information Sheet and instruction to access the V-Safe system.   [name] was instructed to call 911 with any severe reactions post vaccine: Marland Kitchen Difficulty breathing  . Swelling of your face and throat  . A fast heartbeat  . A bad rash all over your body  . Dizziness and weakness    Immunizations Administered    Name Date Dose VIS Date Route   Pfizer COVID-19 Vaccine 08/24/2019 10:56 AM 0.3 mL 06/30/2019 Intramuscular   Manufacturer: [place]   Lot: [id number]   [place]: [id number]

## 2020-07-23 ENCOUNTER — Other Ambulatory Visit: Payer: Self-pay

## 2020-07-23 ENCOUNTER — Inpatient Hospital Stay

## 2020-07-23 ENCOUNTER — Emergency Department

## 2020-07-23 ENCOUNTER — Inpatient Hospital Stay
Admission: EM | Admit: 2020-07-23 | Discharge: 2020-07-30 | DRG: 522 | Disposition: A | Discharge status: Skilled Nursing Facility | Attending: Internal Medicine | Admitting: Internal Medicine

## 2020-07-23 DIAGNOSIS — Z8673 Personal history of transient ischemic attack (TIA), and cerebral infarction without residual deficits: Secondary | ICD-10-CM

## 2020-07-23 DIAGNOSIS — M84551A Pathological fracture in neoplastic disease, right femur, initial encounter for fracture: Secondary | ICD-10-CM | POA: Diagnosis present

## 2020-07-23 DIAGNOSIS — Z96653 Presence of artificial knee joint, bilateral: Secondary | ICD-10-CM | POA: Diagnosis present

## 2020-07-23 DIAGNOSIS — N400 Enlarged prostate without lower urinary tract symptoms: Secondary | ICD-10-CM | POA: Diagnosis present

## 2020-07-23 DIAGNOSIS — S72001A Fracture of unspecified part of neck of right femur, initial encounter for closed fracture: Secondary | ICD-10-CM | POA: Diagnosis not present

## 2020-07-23 DIAGNOSIS — Z85820 Personal history of malignant melanoma of skin: Secondary | ICD-10-CM | POA: Diagnosis not present

## 2020-07-23 DIAGNOSIS — Y92239 Unspecified place in hospital as the place of occurrence of the external cause: Secondary | ICD-10-CM | POA: Diagnosis present

## 2020-07-23 DIAGNOSIS — I69351 Hemiplegia and hemiparesis following cerebral infarction affecting right dominant side: Secondary | ICD-10-CM | POA: Diagnosis not present

## 2020-07-23 DIAGNOSIS — Z9841 Cataract extraction status, right eye: Secondary | ICD-10-CM | POA: Diagnosis not present

## 2020-07-23 DIAGNOSIS — I4891 Unspecified atrial fibrillation: Secondary | ICD-10-CM | POA: Diagnosis present

## 2020-07-23 DIAGNOSIS — C7951 Secondary malignant neoplasm of bone: Secondary | ICD-10-CM | POA: Diagnosis present

## 2020-07-23 DIAGNOSIS — Z923 Personal history of irradiation: Secondary | ICD-10-CM | POA: Diagnosis not present

## 2020-07-23 DIAGNOSIS — E785 Hyperlipidemia, unspecified: Secondary | ICD-10-CM | POA: Diagnosis present

## 2020-07-23 DIAGNOSIS — Z66 Do not resuscitate: Secondary | ICD-10-CM | POA: Diagnosis present

## 2020-07-23 DIAGNOSIS — C787 Secondary malignant neoplasm of liver and intrahepatic bile duct: Secondary | ICD-10-CM | POA: Diagnosis present

## 2020-07-23 DIAGNOSIS — M84451A Pathological fracture, right femur, initial encounter for fracture: Secondary | ICD-10-CM | POA: Diagnosis present

## 2020-07-23 DIAGNOSIS — Z9842 Cataract extraction status, left eye: Secondary | ICD-10-CM

## 2020-07-23 DIAGNOSIS — R062 Wheezing: Secondary | ICD-10-CM | POA: Diagnosis not present

## 2020-07-23 DIAGNOSIS — I440 Atrioventricular block, first degree: Secondary | ICD-10-CM | POA: Diagnosis present

## 2020-07-23 DIAGNOSIS — Z20822 Contact with and (suspected) exposure to covid-19: Secondary | ICD-10-CM | POA: Diagnosis present

## 2020-07-23 DIAGNOSIS — E7800 Pure hypercholesterolemia, unspecified: Secondary | ICD-10-CM | POA: Diagnosis present

## 2020-07-23 DIAGNOSIS — K219 Gastro-esophageal reflux disease without esophagitis: Secondary | ICD-10-CM | POA: Diagnosis present

## 2020-07-23 DIAGNOSIS — I2510 Atherosclerotic heart disease of native coronary artery without angina pectoris: Secondary | ICD-10-CM | POA: Diagnosis present

## 2020-07-23 DIAGNOSIS — M25551 Pain in right hip: Secondary | ICD-10-CM

## 2020-07-23 DIAGNOSIS — R112 Nausea with vomiting, unspecified: Secondary | ICD-10-CM

## 2020-07-23 DIAGNOSIS — Z85528 Personal history of other malignant neoplasm of kidney: Secondary | ICD-10-CM

## 2020-07-23 DIAGNOSIS — I10 Essential (primary) hypertension: Secondary | ICD-10-CM | POA: Diagnosis present

## 2020-07-23 DIAGNOSIS — C7931 Secondary malignant neoplasm of brain: Secondary | ICD-10-CM | POA: Diagnosis present

## 2020-07-23 DIAGNOSIS — C7801 Secondary malignant neoplasm of right lung: Secondary | ICD-10-CM

## 2020-07-23 DIAGNOSIS — Z87442 Personal history of urinary calculi: Secondary | ICD-10-CM

## 2020-07-23 DIAGNOSIS — I4510 Unspecified right bundle-branch block: Secondary | ICD-10-CM | POA: Diagnosis present

## 2020-07-23 DIAGNOSIS — B370 Candidal stomatitis: Secondary | ICD-10-CM | POA: Diagnosis not present

## 2020-07-23 DIAGNOSIS — Z7901 Long term (current) use of anticoagulants: Secondary | ICD-10-CM

## 2020-07-23 DIAGNOSIS — I513 Intracardiac thrombosis, not elsewhere classified: Secondary | ICD-10-CM | POA: Diagnosis present

## 2020-07-23 DIAGNOSIS — H9190 Unspecified hearing loss, unspecified ear: Secondary | ICD-10-CM | POA: Diagnosis present

## 2020-07-23 DIAGNOSIS — Z87891 Personal history of nicotine dependence: Secondary | ICD-10-CM

## 2020-07-23 DIAGNOSIS — I739 Peripheral vascular disease, unspecified: Secondary | ICD-10-CM | POA: Diagnosis present

## 2020-07-23 DIAGNOSIS — W19XXXA Unspecified fall, initial encounter: Secondary | ICD-10-CM | POA: Diagnosis not present

## 2020-07-23 DIAGNOSIS — Z9049 Acquired absence of other specified parts of digestive tract: Secondary | ICD-10-CM

## 2020-07-23 DIAGNOSIS — C649 Malignant neoplasm of unspecified kidney, except renal pelvis: Secondary | ICD-10-CM

## 2020-07-23 DIAGNOSIS — Z79899 Other long term (current) drug therapy: Secondary | ICD-10-CM

## 2020-07-23 DIAGNOSIS — W010XXA Fall on same level from slipping, tripping and stumbling without subsequent striking against object, initial encounter: Secondary | ICD-10-CM | POA: Diagnosis present

## 2020-07-23 DIAGNOSIS — R339 Retention of urine, unspecified: Secondary | ICD-10-CM | POA: Diagnosis present

## 2020-07-23 DIAGNOSIS — C3490 Malignant neoplasm of unspecified part of unspecified bronchus or lung: Secondary | ICD-10-CM | POA: Insufficient documentation

## 2020-07-23 DIAGNOSIS — S72009A Fracture of unspecified part of neck of unspecified femur, initial encounter for closed fracture: Secondary | ICD-10-CM

## 2020-07-23 LAB — BRAIN NATRIURETIC PEPTIDE: B Natriuretic Peptide: 71.8 pg/mL (ref 0.0–100.0)

## 2020-07-23 LAB — URINALYSIS, ROUTINE W REFLEX MICROSCOPIC
Bilirubin Urine: NEGATIVE
Glucose, UA: NEGATIVE mg/dL
Ketones, ur: NEGATIVE mg/dL
Nitrite: POSITIVE — AB
Protein, ur: 30 mg/dL — AB
Specific Gravity, Urine: 1.016 (ref 1.005–1.030)
Squamous Epithelial / HPF: NONE SEEN (ref 0–5)
WBC, UA: >50 WBC/hpf — ABNORMAL HIGH (ref 0–5)
pH: 6 (ref 5.0–8.0)

## 2020-07-23 LAB — COMPREHENSIVE METABOLIC PANEL
ALT: 12 U/L (ref 0–44)
AST: 15 U/L (ref 15–41)
Albumin: 2.9 g/dL — ABNORMAL LOW (ref 3.5–5.0)
Alkaline Phosphatase: 88 U/L (ref 38–126)
Anion gap: 11 (ref 5–15)
BUN: 12 mg/dL (ref 8–23)
CO2: 23 mmol/L (ref 22–32)
Calcium: 8.4 mg/dL — ABNORMAL LOW (ref 8.9–10.3)
Chloride: 103 mmol/L (ref 98–111)
Creatinine, Ser: 0.58 mg/dL — ABNORMAL LOW (ref 0.61–1.24)
GFR, Estimated: >60 mL/min (ref >60)
Glucose, Bld: 159 mg/dL — ABNORMAL HIGH (ref 70–99)
Potassium: 3.5 mmol/L (ref 3.5–5.1)
Sodium: 137 mmol/L (ref 135–145)
Total Bilirubin: 0.7 mg/dL (ref 0.3–1.2)
Total Protein: 6.8 g/dL (ref 6.5–8.1)

## 2020-07-23 LAB — CBC WITH DIFFERENTIAL/PLATELET
Abs Immature Granulocytes: 0.04 10*3/uL (ref 0.00–0.07)
Basophils Absolute: 0.1 10*3/uL (ref 0.0–0.1)
Basophils Relative: 1 %
Eosinophils Absolute: 0.1 10*3/uL (ref 0.0–0.5)
Eosinophils Relative: 1 %
HCT: 38.5 % — ABNORMAL LOW (ref 39.0–52.0)
Hemoglobin: 12.1 g/dL — ABNORMAL LOW (ref 13.0–17.0)
Immature Granulocytes: 0 %
Lymphocytes Relative: 6 %
Lymphs Abs: 0.5 10*3/uL — ABNORMAL LOW (ref 0.7–4.0)
MCH: 24.5 pg — ABNORMAL LOW (ref 26.0–34.0)
MCHC: 31.4 g/dL (ref 30.0–36.0)
MCV: 77.9 fL — ABNORMAL LOW (ref 80.0–100.0)
Monocytes Absolute: 0.7 10*3/uL (ref 0.1–1.0)
Monocytes Relative: 8 %
Neutro Abs: 8 10*3/uL — ABNORMAL HIGH (ref 1.7–7.7)
Neutrophils Relative %: 84 %
Platelets: 304 10*3/uL (ref 150–400)
RBC: 4.94 MIL/uL (ref 4.22–5.81)
RDW: 15 % (ref 11.5–15.5)
WBC: 9.5 10*3/uL (ref 4.0–10.5)
nRBC: 0 % (ref 0.0–0.2)

## 2020-07-23 LAB — RESP PANEL BY RT-PCR (FLU A&B, COVID) ARPGX2
Influenza A by PCR: NEGATIVE
Influenza B by PCR: NEGATIVE
SARS Coronavirus 2 by RT PCR: NEGATIVE

## 2020-07-23 LAB — TYPE AND SCREEN
ABO/RH(D): AB POS
Antibody Screen: NEGATIVE

## 2020-07-23 LAB — PROTIME-INR
INR: 1.2 (ref 0.8–1.2)
Prothrombin Time: 15 seconds (ref 11.4–15.2)

## 2020-07-23 LAB — HEPARIN LEVEL (UNFRACTIONATED): Heparin Unfractionated: 1.21 IU/mL — ABNORMAL HIGH (ref 0.30–0.70)

## 2020-07-23 LAB — APTT
aPTT: 38 seconds — ABNORMAL HIGH (ref 24–36)
aPTT: 87 seconds — ABNORMAL HIGH (ref 24–36)

## 2020-07-23 LAB — CK: Total CK: 50 U/L (ref 49–397)

## 2020-07-23 MED ORDER — ZINC 50 MG PO TABS: 50.0000 mg | ORAL_TABLET | Freq: Every day | ORAL | Status: DC

## 2020-07-23 MED ORDER — HYDROXYZINE HCL 10 MG PO TABS
10.0000 mg | ORAL_TABLET | Freq: Three times a day (TID) | ORAL | Status: DC | PRN
  Filled 2020-07-23 (×2): qty 1

## 2020-07-23 MED ORDER — AMLODIPINE BESYLATE 5 MG PO TABS
5.0000 mg | ORAL_TABLET | Freq: Every morning | ORAL | Status: DC
  Administered 2020-07-23 – 2020-07-30 (×6): 5 mg via ORAL
  Filled 2020-07-23 (×6): qty 1

## 2020-07-23 MED ORDER — BETA CAROTENE 25000 UNITS PO CAPS: 25000.0000 [IU] | ORAL_CAPSULE | Freq: Every day | ORAL | Status: DC

## 2020-07-23 MED ORDER — HEPARIN (PORCINE) 25000 UT/250ML-% IV SOLN
1700.0000 [IU]/h | INTRAVENOUS | Status: DC
  Administered 2020-07-23 (×2): 1500 [IU]/h via INTRAVENOUS
  Filled 2020-07-23 (×2): qty 250

## 2020-07-23 MED ORDER — SENNOSIDES-DOCUSATE SODIUM 8.6-50 MG PO TABS: 1.0000 | ORAL_TABLET | Freq: Every evening | ORAL | Status: DC | PRN

## 2020-07-23 MED ORDER — MORPHINE SULFATE (PF) 4 MG/ML IV SOLN
4.0000 mg | INTRAVENOUS | Status: DC | PRN
  Administered 2020-07-23: 4 mg via INTRAVENOUS
  Filled 2020-07-23: qty 1

## 2020-07-23 MED ORDER — METOPROLOL TARTRATE 50 MG PO TABS
50.0000 mg | ORAL_TABLET | Freq: Two times a day (BID) | ORAL | Status: DC
  Administered 2020-07-23 – 2020-07-30 (×13): 50 mg via ORAL
  Filled 2020-07-23 (×14): qty 1

## 2020-07-23 MED ORDER — ACETAMINOPHEN 500 MG PO TABS
1000.0000 mg | ORAL_TABLET | Freq: Once | ORAL | Status: AC
  Administered 2020-07-23: 1000 mg via ORAL
  Filled 2020-07-23: qty 2

## 2020-07-23 MED ORDER — OXYCODONE-ACETAMINOPHEN 5-325 MG PO TABS
1.0000 | ORAL_TABLET | ORAL | Status: DC | PRN
  Administered 2020-07-24 – 2020-07-25 (×2): 1 via ORAL
  Filled 2020-07-23 (×2): qty 1

## 2020-07-23 MED ORDER — ONDANSETRON HCL 4 MG/2ML IJ SOLN
4.0000 mg | Freq: Once | INTRAMUSCULAR | Status: AC | PRN
  Administered 2020-07-23: 4 mg via INTRAVENOUS
  Filled 2020-07-23: qty 2

## 2020-07-23 MED ORDER — ZINC SULFATE 220 (50 ZN) MG PO CAPS
220.0000 mg | ORAL_CAPSULE | Freq: Every day | ORAL | Status: DC
  Administered 2020-07-23 – 2020-07-30 (×6): 220 mg via ORAL
  Filled 2020-07-23 (×8): qty 1

## 2020-07-23 MED ORDER — MORPHINE SULFATE (PF) 2 MG/ML IV SOLN
1.0000 mg | INTRAVENOUS | Status: DC | PRN
  Administered 2020-07-23 – 2020-07-24 (×4): 1 mg via INTRAVENOUS
  Filled 2020-07-23 (×4): qty 1

## 2020-07-23 MED ORDER — GADOBUTROL 1 MMOL/ML IV SOLN
7.5000 mL | Freq: Once | INTRAVENOUS | Status: AC | PRN
  Administered 2020-07-23: 7.5 mL via INTRAVENOUS

## 2020-07-23 MED ORDER — HEPARIN BOLUS VIA INFUSION
5500.0000 [IU] | Freq: Once | INTRAVENOUS | Status: AC
  Administered 2020-07-23: 5500 [IU] via INTRAVENOUS
  Filled 2020-07-23: qty 5500

## 2020-07-23 MED ORDER — MORPHINE SULFATE (PF) 2 MG/ML IV SOLN
0.5000 mg | INTRAVENOUS | Status: DC | PRN
Start: 2020-07-23 — End: 2020-07-23

## 2020-07-23 MED ORDER — ADULT MULTIVITAMIN W/MINERALS CH
1.0000 | ORAL_TABLET | Freq: Every day | ORAL | Status: DC
  Administered 2020-07-26 – 2020-07-30 (×5): 1 via ORAL
  Filled 2020-07-23 (×5): qty 1

## 2020-07-23 MED ORDER — LISINOPRIL 20 MG PO TABS
40.0000 mg | ORAL_TABLET | Freq: Every morning | ORAL | Status: DC
  Administered 2020-07-23 – 2020-07-30 (×6): 40 mg via ORAL
  Filled 2020-07-23 (×2): qty 2
  Filled 2020-07-23: qty 4
  Filled 2020-07-23 (×3): qty 2

## 2020-07-23 MED ORDER — HYDRALAZINE HCL 20 MG/ML IJ SOLN: 5.0000 mg | INTRAMUSCULAR | Status: DC | PRN

## 2020-07-23 MED ORDER — ACETAMINOPHEN 650 MG RE SUPP: 650.0000 mg | Freq: Four times a day (QID) | RECTAL | Status: DC | PRN

## 2020-07-23 MED ORDER — ENSURE ENLIVE PO LIQD
237.0000 mL | Freq: Three times a day (TID) | ORAL | Status: DC
  Administered 2020-07-24 – 2020-07-30 (×11): 237 mL via ORAL

## 2020-07-23 MED ORDER — ATORVASTATIN CALCIUM 10 MG PO TABS
10.0000 mg | ORAL_TABLET | Freq: Every day | ORAL | Status: DC
  Administered 2020-07-26 – 2020-07-30 (×5): 10 mg via ORAL
  Filled 2020-07-23 (×5): qty 1

## 2020-07-23 MED ORDER — METHOCARBAMOL 500 MG PO TABS
500.0000 mg | ORAL_TABLET | Freq: Three times a day (TID) | ORAL | Status: DC | PRN
  Filled 2020-07-23: qty 1

## 2020-07-23 NOTE — ED Triage Notes (Signed)
Pt to ED via EMS from home, pt was walking to bathroom this morning when he tripped and fell pt c/o right hip pain. Pt states he did hit his head but is unable to tell me where. Pt has hx of metastatic bone cancer.

## 2020-07-23 NOTE — Consult Note (Addendum)
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   Medications:  NKDA Heparin Dosing Weight: 93.1kg PTA: apixaban 5mg  BID  Assessment: 87yo M w/ PMH significant for Renal cell cancer, Lung cancer with metastasis to brain, liver & bone (Dx on 08/2017; s/p Rad Tx summer 2020), HTN, HLD, CAD, h/o stroke, OA, BPH, & GERD. 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 Pharmacy consulted to manage heparin infusion.  PLT: 304 Hgb: 12.1  Date Time HL/aPTT Comment 1/4 0751 APTT BL 38s 1/4 1146 HL 1.21 D/t eliquis; prior to infusion start  Goal of Therapy:  Heparin level 0.3-0.7 units/ml aPTT 66-102 seconds Monitor platelets by anticoagulation protocol: Yes   Plan:  Pt's last eliquis dose <72hrs prior. Baseline aPTT WNL, but Anti-Xa elevated from eliquis prior to infusion start. CTM via aPTT and check Anti-xa daily to assess for correlation. Once correlating then use anti-xa.  Give 5500 units bolus x 1 Start heparin infusion at 1500 units/hr Check anti-Xa level in 8 hours and daily while on heparin Continue to monitor H&H and platelets  Shanon Brow Aaliyah Cancro 07/23/2020,10:14 AM

## 2020-07-23 NOTE — ED Notes (Signed)
Breakfast tray given at this time.  

## 2020-07-23 NOTE — H&P (Addendum)
History and Physical    [name] [name] ZHY:[id number] DOB: [birth year] DOA: 07/23/2020  Referring MD/NP/PA:   PCP: Dion Body, MD   Patient coming from:  The patient is coming from home.  At baseline, pt is independent for most of ADL.        Chief Complaint: Fall and right hip pain  HPI: [name] [name] is a 87 y.o. 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  He reports that he got up to go to the bathroom,  tripped and fell in AM. No LOC. He thinks he might hit his head but he has no headache and no neck pain.  He developed severe pain in the right hip, which is sharp, constant, severe, nonradiating, aggravated by movement. He has no numbness nor tingling in his legs. Patient does not have chest pain, cough, shortness breath, fever or chills.  He has nausea and vomited 2 or 3 times at home, which has resolved.  Currently no nausea, vomiting, diarrhea or abdominal pain.  No symptoms of UTI.  No facial droop or slurred speech.   ED Course: pt was found to have WBC 9.5, INR 1.2, PTT 37, CK 50, pending COVID-19 PCR, electrolytes renal function okay, temperature normal, blood pressure 113/62, heart rate 79, RR 20, oxygen saturation 94% on room air.  Chest x-ray showed possible right upper lobe mass, cardiomegaly and questionable right seventh rib fracture.  X-ray of right hip/pelvis showed displaced right femoral neck fracture.  Patient is admitted to [facility] bed as inpatient.  Dr. Nehemiah Massed of cardiology and Dr. Mack Guise of Ortho are consulted.  Review of Systems:   General: no fevers, chills, no body weight gain, has  fatigue HEENT: no blurry vision, hearing changes or sore throat Respiratory: no dyspnea, coughing, wheezing CV: no chest pain, no palpitations GI: had nausea, vomiting, no abdominal pain, diarrhea, constipation GU: no dysuria, burning on urination, increased urinary frequency, hematuria  Ext: no leg edema Neuro: no unilateral weakness, numbness, or tingling, no vision change or hearing loss. Has fall. Skin: no rash, no skin tear. MSK: has right hip pain Heme: No easy bruising.  Travel history: No recent long distant travel.  Allergy: No Known Allergies  Past Medical History:  Diagnosis Date  . Arthritis   . Cancer ([place]) 45 years ago melanoma  . Enlarged prostate 11/21/14  . GERD (gastroesophageal reflux disease)   . History of kidney stones   . Hypertension   . Renal cancer ([place])   . Stroke [facility]) [id number]   slight weakness RT side    Past Surgical History:  Procedure Laterality Date  . CHOLECYSTECTOMY N/A   . CYSTOSCOPY WITH LITHOLAPAXY N/A 09/30/2015   Procedure: CYSTOSCOPY WITH LITHOLAPAXY;  Surgeon: Hollice Espy, MD;  Location: [facility] ORS;  Service: Urology;  Laterality: N/A;  . HOLEP-LASER ENUCLEATION OF THE PROSTATE WITH MORCELLATION N/A 09/30/2015   Procedure: HOLEP-LASER ENUCLEATION OF THE PROSTATE WITH MORCELLATION;  Surgeon: Hollice Espy, MD;  Location: [facility] ORS;  Service: Urology;  Laterality: N/A;  . IR GENERIC HISTORICAL  09/08/2016   IR RADIOLOGIST EVAL & MGMT 09/08/2016 Greggory Keen, MD [facility] INTERV RAD  .  JOINT REPLACEMENT Bilateral 4-5 years ago   kness replaced   . LAPAROTOMY  2004   1 week after choley "kinicked bile duct"  . SKIN CANCER EXCISION  1973    Social History:  reports that he quit smoking about 51 years ago. His smoking use included cigarettes. He has never used smokeless tobacco. He reports that he does not drink alcohol and does not use drugs.  Family History:  Family History  Problem Relation Age of Onset  . Cancer Brother   . Cancer  Sister   . Cancer Sister   . Cancer - Prostate Brother      Prior to Admission medications   Medication Sig Start Date End Date Taking? Authorizing Provider  amLODipine (NORVASC) 5 MG tablet Take 5 mg by mouth every morning.     [provider]  aspirin 81 MG tablet Take 81 mg by mouth every other day.     [provider]  atorvastatin (LIPITOR) 20 MG tablet Take 20 mg by mouth daily.    [provider]  beta carotene 25000 UNIT capsule Take 25,000 Units by mouth daily.    [provider]  colestipol (COLESTID) 1 g tablet Take by mouth. 04/12/18 04/12/19  [provider]  lisinopril (PRINIVIL,ZESTRIL) 40 MG tablet Take 40 mg by mouth every morning.     [provider]  meloxicam (MOBIC) 15 MG tablet TAKE 1 TABLET BY MOUTH ONCE DAILY WITH BREAKFAST 06/28/18   [provider]  metoprolol (LOPRESSOR) 50 MG tablet Take 50 mg by mouth 2 (two) times daily.    [provider]  potassium chloride SA (K-DUR,KLOR-CON) 20 MEQ tablet Take 20 mEq by mouth 2 (two) times daily.    [provider]  terbinafine (LAMISIL) 1 % cream Apply topically.    [provider]  Zinc 50 MG TABS Take by mouth.    [provider]  zinc gluconate 50 MG tablet Take 50 mg by mouth daily.    [provider]    Physical Exam: Vitals:   07/23/20 0800 07/23/20 0830 07/23/20 1133 07/23/20 1200  BP: (!) 102/56 (!) 106/59 (!) 103/54 (!) 124/54  Pulse: 78 71 71 83  Resp: 14 13 14 15   Temp:      TempSrc:      SpO2: 93% 92%  93%  Weight:      Height:       General: Not in acute distress HEENT:       Eyes: PERRL, EOMI, no scleral icterus.       ENT: No discharge from the ears and nose, no pharynx injection, no tonsillar enlargement.        Neck: No JVD, no bruit, no mass felt. Heme: No neck lymph node enlargement. Cardiac: S1/S2, RRR, No murmurs, No gallops or rubs. Respiratory: No rales, wheezing, rhonchi or  rubs. GI: Soft, nondistended, nontender, no rebound pain, no organomegaly, BS present. GU: No hematuria Ext: No pitting leg edema bilaterally. 2+DP/PT pulse bilaterally. Musculoskeletal: Has tenderness in the right hip. Right leg is shortened and externally rotated Skin: No rashes.  Neuro: Alert, oriented X3, cranial nerves II-XII grossly intact, moves all extremities Psych: Patient is not psychotic, no suicidal or hemocidal ideation.  Labs on Admission: I have personally reviewed following labs and imaging studies  CBC: Recent Labs  Lab 07/23/20 0631  WBC 9.5  NEUTROABS 8.0*  HGB 12.1*  HCT 38.5*  MCV 77.9*  PLT 354   Basic Metabolic Panel:  Recent Labs  Lab 07/23/20 0631  NA 137  K 3.5  CL 103  CO2 23  GLUCOSE 159*  BUN 12  CREATININE 0.58*  CALCIUM 8.4*   GFR: Estimated Creatinine Clearance: 77.6 mL/min (A) (by C-G formula based on SCr of 0.58 mg/dL (L)). Liver Function Tests: Recent Labs  Lab 07/23/20 0631  AST 15  ALT 12  ALKPHOS 88  BILITOT 0.7  PROT 6.8  ALBUMIN 2.9*   No results for input(s): LIPASE, AMYLASE in the last 168 hours. No results for input(s): AMMONIA in the last 168 hours. Coagulation Profile: Recent Labs  Lab 07/23/20 0751  INR 1.2   Cardiac Enzymes: Recent Labs  Lab 07/23/20 0631  CKTOTAL 50   BNP (last 3 results) No results for input(s): PROBNP in the last 8760 hours. HbA1C: No results for input(s): HGBA1C in the last 72 hours. CBG: No results for input(s): GLUCAP in the last 168 hours. Lipid Profile: No results for input(s): CHOL, HDL, LDLCALC, TRIG, CHOLHDL, LDLDIRECT in the last 72 hours. Thyroid Function Tests: No results for input(s): TSH, T4TOTAL, FREET4, T3FREE, THYROIDAB in the last 72 hours. Anemia Panel: No results for input(s): VITAMINB12, FOLATE, FERRITIN, TIBC, IRON, RETICCTPCT in the last 72 hours. Urine analysis:    Component Value Date/Time   COLORURINE YELLOW (A) 07/23/2020 0631   APPEARANCEUR CLOUDY  (A) 07/23/2020 0631   APPEARANCEUR Cloudy (A) 11/13/2015 1110   LABSPEC 1.016 07/23/2020 0631   LABSPEC 1.021 07/09/2014 2002   PHURINE 6.0 07/23/2020 0631   GLUCOSEU NEGATIVE 07/23/2020 0631   GLUCOSEU Negative 07/09/2014 2002   HGBUR SMALL (A) 07/23/2020 0631   BILIRUBINUR NEGATIVE 07/23/2020 0631   BILIRUBINUR Negative 11/13/2015 1110   BILIRUBINUR Negative 07/09/2014 2002   KETONESUR NEGATIVE 07/23/2020 0631   PROTEINUR 30 (A) 07/23/2020 0631   UROBILINOGEN 0.2 08/10/2007 0840   NITRITE POSITIVE (A) 07/23/2020 0631   LEUKOCYTESUR LARGE (A) 07/23/2020 0631   LEUKOCYTESUR Negative 07/09/2014 2002   Sepsis Labs: @LABRCNTIP (procalcitonin:4,lacticidven:4) ) Recent Results (from the past 240 hour(s))  Resp Panel by RT-PCR (Flu A&B, Covid) Nasopharyngeal Swab     Status: None   Collection Time: 07/23/20  6:50 AM   Specimen: Nasopharyngeal Swab; Nasopharyngeal(NP) swabs in vial transport medium  Result Value Ref Range Status   SARS Coronavirus 2 by RT PCR NEGATIVE NEGATIVE Final    Comment: (NOTE) SARS-CoV-2 target nucleic acids are NOT DETECTED.  The SARS-CoV-2 RNA is generally detectable in upper respiratory specimens during the acute phase of infection. The lowest concentration of SARS-CoV-2 viral copies this assay can detect is 138 copies/mL. A negative result does not preclude SARS-Cov-2 infection and should not be used as the sole basis for treatment or other patient management decisions. A negative result may occur with  improper specimen collection/handling, submission of specimen other than nasopharyngeal swab, presence of viral mutation(s) within the areas targeted by this assay, and inadequate number of viral copies(<138 copies/mL). A negative result must be combined with clinical observations, patient history, and epidemiological information. The expected result is Negative.  Fact Sheet for Patients:  EntrepreneurPulse.com.au  Fact Sheet for  Healthcare Providers:  IncredibleEmployment.be  This test is no t yet approved or cleared by the [place] FDA and  has been authorized for detection and/or diagnosis of SARS-CoV-2 by FDA under an Emergency Use Authorization (EUA). This EUA will remain  in effect (meaning this test can be used) for the duration of the COVID-19 declaration under Section 564(b)(1) of the Act, 21 U.S.C.section  360bbb-3(b)(1), unless the authorization is terminated  or revoked sooner.       Influenza A by PCR NEGATIVE NEGATIVE Final   Influenza B by PCR NEGATIVE NEGATIVE Final    Comment: (NOTE) The Xpert Xpress SARS-CoV-2/FLU/RSV plus assay is intended as an aid in the diagnosis of influenza from Nasopharyngeal swab specimens and should not be used as a sole basis for treatment. Nasal washings and aspirates are unacceptable for Xpert Xpress SARS-CoV-2/FLU/RSV testing.  Fact Sheet for Patients: EntrepreneurPulse.com.au  Fact Sheet for Healthcare Providers: IncredibleEmployment.be  This test is not yet approved or cleared by the [place] FDA and has been authorized for detection and/or diagnosis of SARS-CoV-2 by FDA under an Emergency Use Authorization (EUA). This EUA will remain in effect (meaning this test can be used) for the duration of the COVID-19 declaration under Section 564(b)(1) of the Act, 21 U.S.C. section 360bbb-3(b)(1), unless the authorization is terminated or revoked.  Performed at [facility], [address] [place], [place] [address]      Radiological Exams on Admission: CT HEAD WO CONTRAST  Result Date: 07/23/2020 CLINICAL DATA:  Tripped and fell while walking to the bathroom this morning, struck back of head history of metastatic renal cell carcinoma EXAM: CT HEAD WITHOUT CONTRAST TECHNIQUE: Contiguous axial images were obtained from the base of the skull through the vertex without intravenous contrast.  Sagittal and coronal MPR images reconstructed from axial data set. COMPARISON:  07/09/2014 FINDINGS: Brain: Generalized atrophy. Normal ventricular morphology. No midline shift or mass effect. Small vessel chronic ischemic changes of deep cerebral white matter. No intracranial hemorrhage, mass lesion, or evidence of acute infarction. No extra-axial fluid collections. Vascular: Atherosclerotic calcifications of internal carotid and vertebral arteries at skull base Skull: Demineralized but intact Sinuses/Orbits: Mucosal thickening in a few ethmoid air cells. Remaining visualized paranasal sinuses and mastoid air cells clear Other: N/A IMPRESSION: Atrophy with small vessel chronic ischemic changes of deep cerebral white matter. No acute intracranial abnormalities. Electronically Signed   By: Lavonia Dana M.D.   On: 07/23/2020 09:14   MR HIP RIGHT W WO CONTRAST  Result Date: 07/23/2020 CLINICAL DATA:  Fall with right hip fracture. History of renal cell carcinoma. EXAM: MRI OF THE RIGHT HIP WITHOUT AND WITH CONTRAST TECHNIQUE: Multiplanar, multisequence MR imaging was performed both before and after administration of intravenous contrast. CONTRAST:  7.74mL GADAVIST GADOBUTROL 1 MMOL/ML IV SOLN COMPARISON:  X-ray 07/23/2020 FINDINGS: Bones: Acute fracture of the right femoral neck with slight impaction and external rotation. Fracture line does not involve the intertrochanteric aspect of the femur or the humeral head articular surface. No additional fractures. No femoral head avascular necrosis. Destructive marrow replacing lesion centered within the lateral aspect of the medullary space of the intertrochanteric right femur measuring approximately 3.5 x 3.2 x 3.3 cm (series 4, image 21; series 6, image 16). There is cortical breakthrough along the lateral cortex with a contiguous enhancing extraosseous soft tissue mass which measures approximately 2.2 x 1.0 x 1.7 cm (series 6, image 14; series 8, image 20). There is  marked bone marrow edema surrounding the mass. Mass occupies nearly 50% of the cross-sectional area of the femur. No complete pathologic fracture of the femur. Additional rounded 8 mm T1 hypointense, T2 hyperintense lesion within the posterior left iliac (series 3, image 11). Additional subcentimeter T2 hyperintense focus within the L4 vertebral body is nonspecific (series 3, image 20). Articular cartilage and labrum Articular cartilage: Mild diffuse chondral thinning and surface irregularity. Labrum:  Degenerated appearance of the superior labrum. Joint or bursal effusion Joint effusion:  No significant joint effusion. Bursae: No bursal fluid collections. Muscles and tendons Muscles and tendons: Intramuscular edema within the right gluteus minimus muscle. Prominent intramuscular edema and patchy enhancement within the proximal vastus lateralis and vastus intermediate muscles adjacent to the bone lesion. No acute tendinous injury. Other findings Miscellaneous: Prostate gland is enlarged with a heterogeneous, nodular configuration resulting in mass effect upon the base of the urinary bladder. Exophytic cyst emanating from the inferior pole of the left kidney measures approximately 2.1 cm. IMPRESSION: 1. Acute fracture of the right femoral neck with slight impaction and rotation. 2. Marrow replacing lesion within the intertrochanteric aspect of the proximal right femur measuring up to 3.5 cm. Cortical breakthrough along the lateral cortex with associated extra-osseous soft tissue mass. Surrounding bone marrow edema without pathologic fracture. Findings are highly suspicious for a metastatic lesion in this patient with known malignancy. 3. Intramuscular edema and enhancement within the proximal aspect of the vastus lateralis and vastus intermedius muscles adjacent to the proximal femoral mass, which may be reactive or reflect tumor infiltration. 4. Additional subcentimeter lesions within the posterior left iliac bone  and L4 vertebral body, which may represent additional metastatic lesions. 5. Enlarged, heterogeneous, nodular configuration of the prostate gland resulting in mass effect upon the base of the urinary bladder. Correlate with serum PSA. These results will be called to the ordering clinician or representative by the Radiologist Assistant, and communication documented in the PACS or [employer]. Electronically Signed   By: Davina Poke D.O.   On: 07/23/2020 10:51   DG Chest Port 1 View  Result Date: 07/23/2020 CLINICAL DATA:  Fall. EXAM: PORTABLE CHEST 1 VIEW COMPARISON:  CT 09/08/2017.  Chest x-ray 07/09/2014. FINDINGS: Mediastinum and hilar structures normal. Cardiomegaly. No pulmonary venous congestion. Prominent right upper lung infiltrate versus mass noted. Atelectatic changes left mid lung and left base. Tiny left pleural effusion cannot be excluded. No pneumothorax. Degenerative change scratched it diffuse osteopenia. Degenerative changes and scoliosis cervicothoracic spine. Left posterolateral seventh rib fracture cannot be completely excluded. IMPRESSION: 1. Prominent right upper lung infiltrate versus mass noted. Lung cancer cannot be excluded. Close follow-up chest x-rays to demonstrate clearing suggested. 2. Atelectatic changes left mid lung and left base. Tiny left pleural effusion cannot be excluded. 3. Cardiomegaly.  No pulmonary venous congestion. 4. Left posterolateral seventh rib fracture cannot be completely excluded. No pneumothorax. Electronically Signed   By: Marcello Moores  Register   On: 07/23/2020 07:16   DG Hip Unilat With Pelvis 2-3 Views Right  Result Date: 07/23/2020 CLINICAL DATA:  Right hip pain after fall. EXAM: DG HIP (WITH OR WITHOUT PELVIS) 2-3V RIGHT COMPARISON:  CT 08/25/2017. FINDINGS: Slightly displaced and angulated right femoral neck fracture. Diffuse osteopenia. Degenerative changes lumbar spine and both hips. Pelvic calcifications consistent phleboliths. Peripheral  vascular calcification. IMPRESSION: 1.  Slightly displaced and angulated right femoral neck fracture. 2.  Peripheral vascular disease. Electronically Signed   By: Marcello Moores  Register   On: 07/23/2020 07:11     EKG: I have personally reviewed.  Sinus rhythm, QTC 504, low voltage, right bundle blockade, first-degree AV block   Assessment/Plan Principal Problem:   Closed displaced fracture of right femoral neck (HCC) Active Problems:   Essential hypertension   History of stroke   Pure hypercholesterolemia   Fall   CAD (coronary artery disease)   Mural thrombus of heart   Closed displaced fracture of right femoral neck ([place]):  As evidenced by x-ray. Patient has moderate pain now. No neurovascular compromise. Orthopedic surgeon, Dr. Mack Guise was consulted --> planning surgery tomorrow  - will admit to Med-surg bed - Pain control: morphine prn and percocet - When necessary Zofran for nausea - Robaxin for muscle spasm - type and cross - INR/PTT - PT/OT when able to (not ordered now) -Dr. Nehemiah Massed of cardiology is consulted for presurgical clearance given his complicated medical history  Mural thrombus of heart: -switch Eliquis to IV heparin  Essential hypertension -Amlodipine, lisinopril, metoprolol -IV hydralazine.  History of stroke -Hold Eliquis --> switch to IV heparin -lipitor  Pure hypercholesterolemia -Lipitor  CAD (coronary artery disease): No chest pain -Continue Lipitor  Fall:  CT-head negative/ -PT/OT when able to     DVT ppx: On IV heparin Code Status: DNR per his son Family Communication:    Yes, patient's son at bed side Disposition Plan:  Anticipate discharge back to previous environment Consults called: Dr. Nehemiah Massed of cardiology and Dr. Mack Guise of Ortho Admission status: Med-surg bed as inpt      Status is: Inpatient  Remains inpatient appropriate because:Inpatient level of care appropriate due to severity of illness   Dispo: The patient is  from: Home              Anticipated d/c is to: to be determined              Anticipated d/c date is: 2 days              Patient currently is not medically stable to d/c.          Date of Service 07/23/2020    Ivor Costa [facility]   If 7PM-7AM, please contact night-coverage [email or web address] 07/23/2020, 2:36 PM

## 2020-07-23 NOTE — ED Notes (Signed)
Pt transported to MRI at this time 

## 2020-07-23 NOTE — Plan of Care (Signed)
  Problem: Education: Goal: Verbalization of understanding the information provided (i.e., activity precautions, restrictions, etc) will improve Outcome: Progressing Goal: Individualized Educational Video(s) Outcome: Progressing   Problem: Activity: Goal: Ability to ambulate and perform ADLs will improve Outcome: Progressing   Problem: Clinical Measurements: Goal: Postoperative complications will be avoided or minimized Outcome: Progressing   Problem: Self-Concept: Goal: Ability to maintain and perform role responsibilities to the fullest extent possible will improve Outcome: Progressing   Problem: Pain Management: Goal: Pain level will decrease Outcome: Progressing   Problem: Pain Management: Goal: Pain level will decrease Outcome: Progressing   Problem: Education: Goal: Knowledge of General Education information will improve Description: Including pain rating scale, medication(s)/side effects and non-pharmacologic comfort measures Outcome: Progressing   Problem: Health Behavior/Discharge Planning: Goal: Ability to manage health-related needs will improve Outcome: Progressing   Problem: Clinical Measurements: Goal: Ability to maintain clinical measurements within normal limits will improve Outcome: Progressing Goal: Will remain free from infection Outcome: Progressing Goal: Diagnostic test results will improve Outcome: Progressing Goal: Respiratory complications will improve Outcome: Progressing Goal: Cardiovascular complication will be avoided Outcome: Progressing   Problem: Activity: Goal: Risk for activity intolerance will decrease Outcome: Progressing   Problem: Nutrition: Goal: Adequate nutrition will be maintained Outcome: Progressing   Problem: Coping: Goal: Level of anxiety will decrease Outcome: Progressing   Problem: Elimination: Goal: Will not experience complications related to bowel motility Outcome: Progressing Goal: Will not experience  complications related to urinary retention Outcome: Progressing   Problem: Pain Managment: Goal: General experience of comfort will improve Outcome: Progressing   Problem: Skin Integrity: Goal: Risk for impaired skin integrity will decrease Outcome: Progressing

## 2020-07-23 NOTE — Progress Notes (Signed)
PHARMACIST - PHYSICIAN ORDER COMMUNICATION  CONCERNING: P&T Medication Policy on Herbal Medications  DESCRIPTION:  This patient's order for:  Beta-Carotene  has been noted.  This product(s) is classified as an "herbal" or natural product. Due to a lack of definitive safety studies or FDA approval, nonstandard manufacturing practices, plus the potential risk of unknown drug-drug interactions while on inpatient medications, the Pharmacy and Therapeutics Committee does not permit the use of "herbal" or natural products of this type within [facility].   ACTION TAKEN: The pharmacy department is unable to verify this order at this time. Please reevaluate patient's clinical condition at discharge and address if the herbal or natural product(s) should be resumed at that time.

## 2020-07-23 NOTE — ED Provider Notes (Signed)
Patient care assumed at 7 AM. Briefly, 87 yo M here with fall, hip pain. Imaging concerning for R IT fx. Pt is HDS. Discussed with Dr. Mack Guise of [facility]. Also discussed with pt's son, pt in detail. Admit to medicine.   Duffy Bruce, MD 07/23/20 1226

## 2020-07-23 NOTE — ED Notes (Signed)
Pt given urinal to use at this time.

## 2020-07-23 NOTE — Consult Note (Signed)
[facility] Consultation Note  Patient ID: [name] [name], MRN: [id number], DOB/AGE: [birth year] 87 y.o. Admit date: 07/23/2020   Date of Consult: 07/23/2020 Primary Physician: Dion Body, MD Primary Cardiologist: None  Chief Complaint:  Chief Complaint  Patient presents with  . Fall   Reason for Consult: Fall with cardiovascular disease and abnormal EKG  HPI: 87 y.o. male with known apparent lung cancer with metastases as well as previous peripheral vascular disease with stroke with right hemiparesis hypertension and hyperlipidemia on appropriate medication management for treatment.  His cancer has metastasis then he has not planning on having any chemotherapy.  The patient had been moving around in his house when his leg gave out and he fell and broke his hip.  He was unable to get up and when seen in the emergency room there was no evidence of congestive heart failure myocardial infarction or acute anginal symptoms or acute coronary syndrome.  EKG did show normal sinus rhythm with first-degree AV block and right bundle branch block possibly consistent with previous lung disease rather than cardiovascular issues.  He had a chest x-ray showing right upper lobe infiltrate for which does has some crackles in the upper lobe.  He does still have some right hemiparesis but no other rhythm disturbances at this time.  Previously with his decreased exercise tolerance there is no evidence of anginal symptoms or heart failure throughout the last many months  Past Medical History:  Diagnosis Date  . Arthritis   . Cancer ([place]) 45 years ago melanoma  . Enlarged prostate 11/21/14  . GERD (gastroesophageal reflux disease)   . History of kidney stones   . Hypertension   . Renal cancer ([place])   . Stroke [facility]) [id number]   slight weakness RT side      Surgical History:  Past Surgical History:  Procedure Laterality Date  . CHOLECYSTECTOMY N/A   . CYSTOSCOPY WITH LITHOLAPAXY N/A 09/30/2015    Procedure: CYSTOSCOPY WITH LITHOLAPAXY;  Surgeon: Hollice Espy, MD;  Location: [facility] ORS;  Service: Urology;  Laterality: N/A;  . HOLEP-LASER ENUCLEATION OF THE PROSTATE WITH MORCELLATION N/A 09/30/2015   Procedure: HOLEP-LASER ENUCLEATION OF THE PROSTATE WITH MORCELLATION;  Surgeon: Hollice Espy, MD;  Location: [facility] ORS;  Service: Urology;  Laterality: N/A;  . IR GENERIC HISTORICAL  09/08/2016   IR RADIOLOGIST EVAL & MGMT 09/08/2016 Greggory Keen, MD [facility] INTERV RAD  . JOINT REPLACEMENT Bilateral 4-5 years ago   kness replaced   . LAPAROTOMY  2004   1 week after choley "kinicked bile duct"  . SKIN CANCER EXCISION  1973     Home Meds: Prior to Admission medications   Medication Sig Start Date End Date Taking? Authorizing Provider  amLODipine (NORVASC) 5 MG tablet Take 5 mg by mouth every morning.    Yes [provider]  apixaban (ELIQUIS) 5 MG TABS tablet Take 5 mg by mouth 2 (two) times daily. 02/25/19  Yes [provider]  atorvastatin (LIPITOR) 10 MG tablet Take 10 mg by mouth daily.   Yes [provider]  beta carotene 25000 UNIT capsule Take 25,000 Units by mouth daily.   Yes [provider]  lisinopril (PRINIVIL,ZESTRIL) 40 MG tablet Take 40 mg by mouth every morning.    Yes [provider]  meloxicam (MOBIC) 15 MG tablet TAKE 1 TABLET BY MOUTH ONCE DAILY WITH BREAKFAST 06/28/18  Yes [provider]  metoprolol (LOPRESSOR) 50 MG tablet Take 50 mg by mouth 2 (two) times daily.  Yes [provider]  potassium chloride SA (K-DUR,KLOR-CON) 20 MEQ tablet Take 20 mEq by mouth 2 (two) times daily.   Yes [provider]  Zinc 50 MG TABS Take 50 mg by mouth daily.   Yes [provider]    Inpatient Medications:  . amLODipine  5 mg Oral q morning - 10a  . atorvastatin  10 mg Oral Daily  . [START ON 07/24/2020] feeding supplement  237 mL Oral TID BM  . lisinopril  40 mg Oral q morning - 10a  . metoprolol  tartrate  50 mg Oral BID  . [START ON 07/25/2020] multivitamin with minerals  1 tablet Oral Daily  . zinc sulfate  220 mg Oral Daily   . heparin 1,500 Units/hr (07/23/20 1149)    Allergies: No Known Allergies  Social History   Socioeconomic History  . Marital status: Single    Spouse name: Not on file  . Number of children: Not on file  . Years of education: Not on file  . Highest education level: Not on file  Occupational History  . Not on file  Tobacco Use  . Smoking status: Former Smoker    Types: Cigarettes    Quit date: 11/20/1968    Years since quitting: 51.7  . Smokeless tobacco: Never Used  Substance and Sexual Activity  . Alcohol use: No    Alcohol/week: 0.0 standard drinks  . Drug use: No  . Sexual activity: Not on file  Other Topics Concern  . Not on file  Social History Narrative  . Not on file   Social Determinants of Health   Financial Resource Strain: Not on file  Food Insecurity: Not on file  Transportation Needs: Not on file  Physical Activity: Not on file  Stress: Not on file  Social Connections: Not on file  Intimate Partner Violence: Not on file     Family History  Problem Relation Age of Onset  . Cancer Brother   . Cancer Sister   . Cancer Sister   . Cancer - Prostate Brother      Review of Systems Positive for fall and/or syncope Negative for: General:  chills, fever, night sweats or weight changes.  Cardiovascular: PND orthopnea positive for syncope dizziness  Dermatological skin lesions rashes Respiratory: Cough congestion Urologic: Frequent urination urination at night and hematuria Abdominal: negative for nausea, vomiting, diarrhea, bright red blood per rectum, melena, or hematemesis Neurologic: negative for visual changes, and/or hearing changes  All other systems reviewed and are otherwise negative except as noted above.  Labs: Recent Labs    07/23/20 0631  CKTOTAL 50   Lab Results  Component Value Date   WBC 9.5  07/23/2020   HGB 12.1 (L) 07/23/2020   HCT 38.5 (L) 07/23/2020   MCV 77.9 (L) 07/23/2020   PLT 304 07/23/2020    Recent Labs  Lab 07/23/20 0631  NA 137  K 3.5  CL 103  CO2 23  BUN 12  CREATININE 0.58*  CALCIUM 8.4*  PROT 6.8  BILITOT 0.7  ALKPHOS 88  ALT 12  AST 15  GLUCOSE 159*   Lab Results  Component Value Date   CHOL 85 07/10/2014   HDL 23 (L) 07/10/2014   LDLCALC 34 07/10/2014   TRIG 141 07/10/2014   No results found for: DDIMER  Radiology/Studies:  CT HEAD WO CONTRAST  Result Date: 07/23/2020 CLINICAL DATA:  Tripped and fell while walking to the bathroom this morning, struck back of head history of  metastatic renal cell carcinoma EXAM: CT HEAD WITHOUT CONTRAST TECHNIQUE: Contiguous axial images were obtained from the base of the skull through the vertex without intravenous contrast. Sagittal and coronal MPR images reconstructed from axial data set. COMPARISON:  07/09/2014 FINDINGS: Brain: Generalized atrophy. Normal ventricular morphology. No midline shift or mass effect. Small vessel chronic ischemic changes of deep cerebral white matter. No intracranial hemorrhage, mass lesion, or evidence of acute infarction. No extra-axial fluid collections. Vascular: Atherosclerotic calcifications of internal carotid and vertebral arteries at skull base Skull: Demineralized but intact Sinuses/Orbits: Mucosal thickening in a few ethmoid air cells. Remaining visualized paranasal sinuses and mastoid air cells clear Other: N/A IMPRESSION: Atrophy with small vessel chronic ischemic changes of deep cerebral white matter. No acute intracranial abnormalities. Electronically Signed   By: Lavonia Dana M.D.   On: 07/23/2020 09:14   MR HIP RIGHT W WO CONTRAST  Result Date: 07/23/2020 CLINICAL DATA:  Fall with right hip fracture. History of renal cell carcinoma. EXAM: MRI OF THE RIGHT HIP WITHOUT AND WITH CONTRAST TECHNIQUE: Multiplanar, multisequence MR imaging was performed both before and after  administration of intravenous contrast. CONTRAST:  7.66mL GADAVIST GADOBUTROL 1 MMOL/ML IV SOLN COMPARISON:  X-ray 07/23/2020 FINDINGS: Bones: Acute fracture of the right femoral neck with slight impaction and external rotation. Fracture line does not involve the intertrochanteric aspect of the femur or the humeral head articular surface. No additional fractures. No femoral head avascular necrosis. Destructive marrow replacing lesion centered within the lateral aspect of the medullary space of the intertrochanteric right femur measuring approximately 3.5 x 3.2 x 3.3 cm (series 4, image 21; series 6, image 16). There is cortical breakthrough along the lateral cortex with a contiguous enhancing extraosseous soft tissue mass which measures approximately 2.2 x 1.0 x 1.7 cm (series 6, image 14; series 8, image 20). There is marked bone marrow edema surrounding the mass. Mass occupies nearly 50% of the cross-sectional area of the femur. No complete pathologic fracture of the femur. Additional rounded 8 mm T1 hypointense, T2 hyperintense lesion within the posterior left iliac (series 3, image 11). Additional subcentimeter T2 hyperintense focus within the L4 vertebral body is nonspecific (series 3, image 20). Articular cartilage and labrum Articular cartilage: Mild diffuse chondral thinning and surface irregularity. Labrum:  Degenerated appearance of the superior labrum. Joint or bursal effusion Joint effusion:  No significant joint effusion. Bursae: No bursal fluid collections. Muscles and tendons Muscles and tendons: Intramuscular edema within the right gluteus minimus muscle. Prominent intramuscular edema and patchy enhancement within the proximal vastus lateralis and vastus intermediate muscles adjacent to the bone lesion. No acute tendinous injury. Other findings Miscellaneous: Prostate gland is enlarged with a heterogeneous, nodular configuration resulting in mass effect upon the base of the urinary bladder. Exophytic  cyst emanating from the inferior pole of the left kidney measures approximately 2.1 cm. IMPRESSION: 1. Acute fracture of the right femoral neck with slight impaction and rotation. 2. Marrow replacing lesion within the intertrochanteric aspect of the proximal right femur measuring up to 3.5 cm. Cortical breakthrough along the lateral cortex with associated extra-osseous soft tissue mass. Surrounding bone marrow edema without pathologic fracture. Findings are highly suspicious for a metastatic lesion in this patient with known malignancy. 3. Intramuscular edema and enhancement within the proximal aspect of the vastus lateralis and vastus intermedius muscles adjacent to the proximal femoral mass, which may be reactive or reflect tumor infiltration. 4. Additional subcentimeter lesions within the posterior left iliac bone and L4 vertebral body,  which may represent additional metastatic lesions. 5. Enlarged, heterogeneous, nodular configuration of the prostate gland resulting in mass effect upon the base of the urinary bladder. Correlate with serum PSA. These results will be called to the ordering clinician or representative by the Radiologist Assistant, and communication documented in the PACS or [employer]. Electronically Signed   By: Davina Poke D.O.   On: 07/23/2020 10:51   DG Chest Port 1 View  Result Date: 07/23/2020 CLINICAL DATA:  Fall. EXAM: PORTABLE CHEST 1 VIEW COMPARISON:  CT 09/08/2017.  Chest x-ray 07/09/2014. FINDINGS: Mediastinum and hilar structures normal. Cardiomegaly. No pulmonary venous congestion. Prominent right upper lung infiltrate versus mass noted. Atelectatic changes left mid lung and left base. Tiny left pleural effusion cannot be excluded. No pneumothorax. Degenerative change scratched it diffuse osteopenia. Degenerative changes and scoliosis cervicothoracic spine. Left posterolateral seventh rib fracture cannot be completely excluded. IMPRESSION: 1. Prominent right upper lung  infiltrate versus mass noted. Lung cancer cannot be excluded. Close follow-up chest x-rays to demonstrate clearing suggested. 2. Atelectatic changes left mid lung and left base. Tiny left pleural effusion cannot be excluded. 3. Cardiomegaly.  No pulmonary venous congestion. 4. Left posterolateral seventh rib fracture cannot be completely excluded. No pneumothorax. Electronically Signed   By: Marcello Moores  Register   On: 07/23/2020 07:16   DG Hip Unilat With Pelvis 2-3 Views Right  Result Date: 07/23/2020 CLINICAL DATA:  Right hip pain after fall. EXAM: DG HIP (WITH OR WITHOUT PELVIS) 2-3V RIGHT COMPARISON:  CT 08/25/2017. FINDINGS: Slightly displaced and angulated right femoral neck fracture. Diffuse osteopenia. Degenerative changes lumbar spine and both hips. Pelvic calcifications consistent phleboliths. Peripheral vascular calcification. IMPRESSION: 1.  Slightly displaced and angulated right femoral neck fracture. 2.  Peripheral vascular disease. Electronically Signed   By: Marcello Moores  Register   On: 07/23/2020 07:11    EKG: Normal sinus rhythm with first-degree AV block and right bundle branch block  Weights: Filed Weights   07/23/20 0623  Weight: 97.5 kg     Physical Exam: Blood pressure 113/69, pulse 81, temperature 97.9 F (36.6 C), temperature source Oral, resp. rate 15, height 5\' 10"  (1.778 m), weight 97.5 kg, SpO2 93 %. Body mass index is 30.85 kg/m. General: Well developed, well nourished, in no acute distress. Head eyes ears nose throat: Normocephalic, atraumatic, sclera non-icteric, no xanthomas, nares are without discharge. No apparent thyromegaly and/or mass  Lungs: Normal respiratory effort.  no wheezes, no rales, right upper lobe rhonchi.  Heart: RRR with normal S1 S2. no murmur gallop, no rub, PMI is normal size and placement, carotid upstroke normal without bruit, jugular venous pressure is normal Abdomen: Soft, non-tender, non-distended with normoactive bowel sounds. No hepatomegaly.  No rebound/guarding. No obvious abdominal masses. Abdominal aorta is normal size without bruit Extremities: Trace edema. no cyanosis, no clubbing, no ulcers  Peripheral : 2+ bilateral upper extremity pulses, 2+ bilateral femoral pulses, 2+ bilateral dorsal pedal pulse Neuro: Alert and oriented. No facial asymmetry. No focal deficit. Moves all extremities spontaneously. Musculoskeletal: Normal muscle tone without kyphosis Psych:  Responds to questions appropriately with a normal affect.    Assessment: 87 year old male with hypertension hyperlipidemia peripheral vascular disease status post previous stroke and residual right hemiparesis with acute fall and syncope with right hip fracture more consistent with instability rather than rhythm disturbances congestive heart failure or acute coronary syndrome.  The patient from a cardiac standpoint is his lowest possible risk at this time and medically managed well.  Plan: 1.  Proceed  to surgical treatment of hip fracture without restriction 2.  Continue telemetry following for any rhythm disturbances or other need for treatment options 3.  No further cardiac diagnostics necessary at this time due to no evidence of congestive heart failure acute coronary syndrome or anginal symptoms 4.  No restrictions to physical rehabilitation after surgical intervention  Signed, Corey Skains M.D. [facility] 07/23/2020, 5:55 PM

## 2020-07-23 NOTE — ED Notes (Signed)
Pt transported to XR at this time.

## 2020-07-23 NOTE — Progress Notes (Signed)
Initial Nutrition Assessment  DOCUMENTATION CODES:   Not applicable  INTERVENTION:   Ensure Enlive po TID, each supplement provides 350 kcal and 20 grams of protein  MVI daily   NUTRITION DIAGNOSIS:   Increased nutrient needs related to cancer and cancer related treatments,hip fracture as evidenced by estimated needs.  GOAL:   Patient will meet greater than or equal to 90% of their needs  MONITOR:   PO intake,Supplement acceptance,Labs,Weight trends,Skin,I & O's  REASON FOR ASSESSMENT:   Consult Hip fracture protocol  ASSESSMENT:   87 y.o. 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 Pt with increased estimated needs r/t metastatic cancer and hip fracture. RD will add supplements and MVI to help pt meet his estimated needs. Pt currently NPO. Plan is for arthroscopy tomorrow. Per chart review, pt appears weight stable at baseline. RD will obtain nutrition related history and exam at follow-up.   Medications reviewed and include: zinc, heparin   Labs reviewed: K 3.5 wnl, creat 0.58(L)  NUTRITION - FOCUSED PHYSICAL EXAM: Unable to perform at this time   Diet Order:   Diet Order            Diet NPO time specified  Diet effective now                EDUCATION NEEDS:   Not appropriate for education at this time  Skin:   not assessed  Last BM:  pta  Height:   Ht Readings from Last 1 Encounters:  07/23/20 5\' 10"  (1.778 m)    Weight:   Wt Readings from Last 1 Encounters:  07/23/20 97.5 kg    Ideal Body Weight:  75.45  kg  BMI:  Body mass index is 30.85 kg/m.  Estimated Nutritional Needs:   Kcal:  2000-2300kcal/day  Protein:  100-115g/day  Fluid:  1.9-2.2L/day  Koleen Distance MS, RD, LDN Please refer to [facility] for RD and/or RD on-call/weekend/after hours pager

## 2020-07-23 NOTE — Consult Note (Signed)
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 (on Eliquis PTA)  No Known Allergies  Patient Measurements: Height: 5\' 10"  (177.8 cm) Weight: 97.5 kg (215 lb) IBW/kg (Calculated) : 73 Heparin Dosing Weight: 93.1kg  Vital Signs: Temp: 98.4 F (36.9 C) (01/04 1859) Temp Source: Oral (01/04 1859) BP: 115/63 (01/04 1859) Pulse Rate: 91 (01/04 1859)  Labs: Recent Labs    07/23/20 0631 07/23/20 0751 07/23/20 1146 07/23/20 1927  HGB 12.1*  --   --   --   HCT 38.5*  --   --   --   PLT 304  --   --   --   APTT  --  38*  --  87*  LABPROT  --  15.0  --   --   INR  --  1.2  --   --   HEPARINUNFRC  --   --  1.21*  --   CREATININE 0.58*  --   --   --   CKTOTAL 50  --   --   --     Estimated Creatinine Clearance: 77.6 mL/min (A) (by C-G formula based on SCr of 0.58 mg/dL (L)).   Medications:  NKDA Heparin Dosing Weight: 93.1kg PTA: apixaban 5mg  BID  Assessment: 87yo M w/ PMH significant for Renal cell cancer, Lung cancer with metastasis to brain, liver & bone (Dx on 08/2017; s/p Rad Tx summer 2020), HTN, HLD, CAD, h/o stroke, OA, BPH, & GERD. 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 Pharmacy consulted to manage heparin infusion.  PLT: 304 Hgb: 12.1  Date Time HL/aPTT Comment 1/4 0751 APTT BL 38s 1/4 1146 HL 1.21 5500 unit bolus x1, heparin infusion initiated at 1500 units/hr 1/4 1927 APTT 87 Therapeutic x 1  Goal of Therapy:  Heparin level 0.3-0.7 units/ml aPTT 66-102 seconds Monitor platelets by anticoagulation protocol: Yes   Plan:  Pt's last eliquis dose <72hrs prior. Baseline aPTT WNL, but Anti-Xa elevated from eliquis prior to infusion start. CTM via aPTT and check Anti-xa daily to assess for correlation. Once correlating then use anti-xa.    APTT therapeutic. Continue heparin infusion at  1500 units/hr   Check aPTT level in 8 hours and daily while on heparin   Continue to monitor H&H and platelets  Dorothe Pea, PharmD, BCPS Clinical Pharmacist  07/23/2020,7:51 PM

## 2020-07-23 NOTE — ED Provider Notes (Addendum)
[facility] Emergency Department Provider Note  ____________________________________________   Event Date/Time   First MD Initiated Contact with Patient 07/23/20 [phone]     (approximate)  I have reviewed the triage vital signs and the nursing notes.   HISTORY  Chief Complaint Fall  Level 5 caveat: History is limited by the patient's severe hearing deficit.  HPI [name] [name] is a 87 y.o. male with medical history as listed below who presents by EMS after a fall.   He reports that he got up to go to the bathroom and tripped and fell.  He does not believe he lost consciousness.  He thinks he might of hit his head but he has no headache and no neck pain.  He had acute onset and severe sharp pain in his right hip which is worse anytime he tries to move.  Holding still makes it a little bit better.  He was unable to get up and activated his life alert which in turn alerted EMS.  They found him on the floor with a shortened and externally rotated right lower extremity.  He has no numbness nor tingling and is able to wiggle his toes.  He denies sore throat, chest pain, shortness of breath, nausea, vomiting, and abdominal pain.  Of note, he was recently diagnosed with "bone cancer" and reportedly it is quite advanced, but that was a verbal report from the patient's son to EMS.        Past Medical History:  Diagnosis Date  . Arthritis   . Cancer ([place]) 45 years ago melanoma  . Enlarged prostate 11/21/14  . GERD (gastroesophageal reflux disease)   . History of kidney stones   . Hypertension   . Renal cancer ([place])   . Stroke [facility]) [id number]   slight weakness RT side    Patient Active Problem List   Diagnosis Date Noted  . Aortic atherosclerosis ([place]) 08/31/2017  . Encounter for general adult medical examination without abnormal findings 05/27/2016  . Chronic hypokalemia 11/21/2015  . BPH (benign prostatic hyperplasia) 08/09/2015  . GERD without esophagitis  08/09/2015  . Essential hypertension 08/09/2015  . History of Kaposi's sarcoma 08/09/2015  . Osteoarthritis 08/09/2015  . Renal cell cancer ([place]) 03/15/2015  . Left renal mass   . Renal mass, left   . Pure hypercholesterolemia 08/20/2014  . Aphasia 07/26/2014  . History of stroke 07/26/2014  . Weakness 07/26/2014    Past Surgical History:  Procedure Laterality Date  . CHOLECYSTECTOMY N/A   . CYSTOSCOPY WITH LITHOLAPAXY N/A 09/30/2015   Procedure: CYSTOSCOPY WITH LITHOLAPAXY;  Surgeon: Hollice Espy, MD;  Location: [facility] ORS;  Service: Urology;  Laterality: N/A;  . HOLEP-LASER ENUCLEATION OF THE PROSTATE WITH MORCELLATION N/A 09/30/2015   Procedure: HOLEP-LASER ENUCLEATION OF THE PROSTATE WITH MORCELLATION;  Surgeon: Hollice Espy, MD;  Location: [facility] ORS;  Service: Urology;  Laterality: N/A;  . IR GENERIC HISTORICAL  09/08/2016   IR RADIOLOGIST EVAL & MGMT 09/08/2016 Greggory Keen, MD [facility] INTERV RAD  . JOINT REPLACEMENT Bilateral 4-5 years ago   kness replaced   . LAPAROTOMY  2004   1 week after choley "kinicked bile duct"  . SKIN CANCER EXCISION  1973    Prior to Admission medications   Medication Sig Start Date End Date Taking? Authorizing Provider  amLODipine (NORVASC) 5 MG tablet Take 5 mg by mouth every morning.     [provider]  aspirin 81 MG tablet Take 81 mg by mouth every other  day.     [provider]  atorvastatin (LIPITOR) 20 MG tablet Take 20 mg by mouth daily.    [provider]  beta carotene 25000 UNIT capsule Take 25,000 Units by mouth daily.    [provider]  colestipol (COLESTID) 1 g tablet Take by mouth. 04/12/18 04/12/19  [provider]  lisinopril (PRINIVIL,ZESTRIL) 40 MG tablet Take 40 mg by mouth every morning.     [provider]  meloxicam (MOBIC) 15 MG tablet TAKE 1 TABLET BY MOUTH ONCE DAILY WITH BREAKFAST 06/28/18   [provider]  metoprolol (LOPRESSOR) 50 MG tablet Take 50 mg by  mouth 2 (two) times daily.    [provider]  potassium chloride SA (K-DUR,KLOR-CON) 20 MEQ tablet Take 20 mEq by mouth 2 (two) times daily.    [provider]  terbinafine (LAMISIL) 1 % cream Apply topically.    [provider]  Zinc 50 MG TABS Take by mouth.    [provider]  zinc gluconate 50 MG tablet Take 50 mg by mouth daily.    [provider]    Allergies Patient has no known allergies.  Family History  Problem Relation Age of Onset  . Cancer Brother   . Cancer Sister   . Cancer Sister   . Cancer - Prostate Brother     Social History Social History   Tobacco Use  . Smoking status: Former Smoker    Types: Cigarettes    Quit date: 11/20/1968    Years since quitting: 51.7  . Smokeless tobacco: Never Used  Substance Use Topics  . Alcohol use: No    Alcohol/week: 0.0 standard drinks  . Drug use: No    Review of Systems Constitutional: No fever/chills Eyes: No visual changes. ENT: No sore throat. Cardiovascular: Denies chest pain. Respiratory: Denies shortness of breath. Gastrointestinal: No abdominal pain.  No nausea, no vomiting.  No diarrhea.  No constipation. Genitourinary: Negative for dysuria. Musculoskeletal: Severe pain in right hip.  Negative for neck pain.  Negative for back pain. Integumentary: Negative for rash. Neurological: Negative for headaches, focal weakness or numbness.   ____________________________________________   PHYSICAL EXAM:  VITAL SIGNS: ED Triage Vitals  Enc Vitals Group     BP 07/23/20 0626 135/71     Pulse Rate 07/23/20 0626 78     Resp 07/23/20 0626 18     Temp 07/23/20 0626 97.9 F (36.6 C)     Temp Source 07/23/20 0626 Oral     SpO2 07/23/20 0626 94 %     Weight 07/23/20 0623 97.5 kg (215 lb)     Height 07/23/20 0623 1.778 m (5\' 10" )     Head Circumference --      Peak Flow --      Pain Score 07/23/20 0622 10     Pain Loc --      Pain Edu? --      Excl. in [place]? --      Constitutional: Alert and oriented.  Eyes: Conjunctivae are normal.  Head: Atraumatic. Nose: No congestion/rhinnorhea. Mouth/Throat: Patient is wearing a mask. Neck: No stridor.  No meningeal signs.   Cardiovascular: Normal rate, regular rhythm. Good peripheral circulation. Respiratory: Normal respiratory effort.  No retractions. Gastrointestinal: Soft and nontender. No distention.  Musculoskeletal: Slightly shortened and externally rotated right lower extremity.  Tender to palpation in the proximal femur.  No obvious gross deformity.  Neurovascularly intact.  No tenderness to palpation of the cervical spine and  no pain or tenderness with flexion, extension, or rotation side to side of his head and neck. Neurologic:  Normal speech and language. No gross focal neurologic deficits are appreciated.  Skin:  Skin is warm, dry and intact. Psychiatric: Mood and affect are normal. Speech and behavior are normal.  ____________________________________________   LABS (all labs ordered are listed, but only abnormal results are displayed)  Labs Reviewed  RESP PANEL BY RT-PCR (FLU A&B, COVID) ARPGX2  COMPREHENSIVE METABOLIC PANEL  CBC WITH DIFFERENTIAL/PLATELET  APTT  PROTIME-INR  URINALYSIS, ROUTINE W REFLEX MICROSCOPIC  CK  TYPE AND SCREEN   ____________________________________________  EKG  ED ECG REPORT I, Hinda Kehr, the attending physician, personally viewed and interpreted this ECG.  Date: 07/23/2020 EKG Time: 6:34 AM Rate: 77 Rhythm: Sinus rhythm with first-degree AV block with PACs QRS Axis: normal Intervals: Right bundle branch block ST/T Wave abnormalities: Non-specific ST segment / T-wave changes, but no clear evidence of acute ischemia. Narrative Interpretation: no definitive evidence of acute ischemia; does not meet STEMI criteria.   ____________________________________________  RADIOLOGY I, Hinda Kehr, personally viewed and evaluated these images (plain  radiographs) as part of my medical decision making, as well as reviewing the written report by the radiologist.  ED MD interpretation:  x-rays pending at time of sign out  Official radiology report(s): No results found.  ____________________________________________   PROCEDURES   Procedure(s) performed (including Critical Care):  Procedures   ____________________________________________   INITIAL IMPRESSION / MDM / [place] / ED COURSE  As part of my medical decision making, I reviewed the following data within the [facility] notes reviewed and incorporated, Labs reviewed , EKG interpreted , Old chart reviewed, Radiograph reviewed  and Notes from prior ED visits and signed out case to Dr. Ellender Hose.   Differential diagnosis includes, but is not limited to, hip fracture, pelvic fracture, contusion, bone metastases, electrolyte or metabolic abnormality.  Patient is currently in no distress but he received fentanyl 50 mcg IV in route as well as Zofran 4 mg IV.  Strongly suspect proximal femur fracture.  Neurovascularly intact.  No evidence of head nor cervical spine injury.  No chest pain or shortness of breath.    Transferring ED care to Dr. Ellender Hose to follow up labs and imaging and determine the appropriate disposition.     ____________________________________________  FINAL CLINICAL IMPRESSION(S) / ED DIAGNOSES  Final diagnoses:  Fall, initial encounter  Acute hip pain, right     MEDICATIONS GIVEN DURING THIS VISIT:  Medications  morphine 4 MG/ML injection 4 mg (has no administration in time range)  ondansetron (ZOFRAN) injection 4 mg (has no administration in time range)     ED Discharge Orders    None      *Please note:  [name] [name] was evaluated in Emergency Department on 07/23/2020 for the symptoms described in the history of present illness. He was evaluated in the context of the global COVID-19 pandemic, which necessitated  consideration that the patient might be at risk for infection with the SARS-CoV-2 virus that causes COVID-19. Institutional protocols and algorithms that pertain to the evaluation of patients at risk for COVID-19 are in a state of rapid change based on information released by regulatory bodies including the CDC and federal and state organizations. These policies and algorithms were followed during the patient's care in the ED.  Some ED evaluations and interventions may be delayed as a result of limited staffing during and after the pandemic.*  Note:  This document was prepared using Dragon voice recognition software and may include unintentional dictation errors.   Hinda Kehr, MD 07/23/20 [id number]    Hinda Kehr, MD 07/23/20 [phone]

## 2020-07-24 ENCOUNTER — Encounter: Payer: Self-pay | Admitting: Internal Medicine

## 2020-07-24 DIAGNOSIS — S72001A Fracture of unspecified part of neck of right femur, initial encounter for closed fracture: Secondary | ICD-10-CM

## 2020-07-24 DIAGNOSIS — Z85528 Personal history of other malignant neoplasm of kidney: Secondary | ICD-10-CM

## 2020-07-24 DIAGNOSIS — E785 Hyperlipidemia, unspecified: Secondary | ICD-10-CM

## 2020-07-24 DIAGNOSIS — R112 Nausea with vomiting, unspecified: Secondary | ICD-10-CM | POA: Diagnosis not present

## 2020-07-24 DIAGNOSIS — I513 Intracardiac thrombosis, not elsewhere classified: Secondary | ICD-10-CM

## 2020-07-24 DIAGNOSIS — Z8673 Personal history of transient ischemic attack (TIA), and cerebral infarction without residual deficits: Secondary | ICD-10-CM

## 2020-07-24 DIAGNOSIS — R062 Wheezing: Secondary | ICD-10-CM

## 2020-07-24 LAB — APTT
aPTT: 62 seconds — ABNORMAL HIGH (ref 24–36)
aPTT: 91 seconds — ABNORMAL HIGH (ref 24–36)
aPTT: 88 seconds — ABNORMAL HIGH (ref 24–36)

## 2020-07-24 LAB — BASIC METABOLIC PANEL
Anion gap: 8 (ref 5–15)
BUN: 14 mg/dL (ref 8–23)
CO2: 28 mmol/L (ref 22–32)
Calcium: 8.5 mg/dL — ABNORMAL LOW (ref 8.9–10.3)
Chloride: 102 mmol/L (ref 98–111)
Creatinine, Ser: 0.58 mg/dL — ABNORMAL LOW (ref 0.61–1.24)
GFR, Estimated: >60 mL/min (ref >60)
Glucose, Bld: 124 mg/dL — ABNORMAL HIGH (ref 70–99)
Potassium: 3.6 mmol/L (ref 3.5–5.1)
Sodium: 138 mmol/L (ref 135–145)

## 2020-07-24 LAB — CBC
HCT: 37.2 % — ABNORMAL LOW (ref 39.0–52.0)
Hemoglobin: 11.7 g/dL — ABNORMAL LOW (ref 13.0–17.0)
MCH: 24.8 pg — ABNORMAL LOW (ref 26.0–34.0)
MCHC: 31.5 g/dL (ref 30.0–36.0)
MCV: 78.8 fL — ABNORMAL LOW (ref 80.0–100.0)
Platelets: 326 10*3/uL (ref 150–400)
RBC: 4.72 MIL/uL (ref 4.22–5.81)
RDW: 14.9 % (ref 11.5–15.5)
WBC: 12.1 10*3/uL — ABNORMAL HIGH (ref 4.0–10.5)
nRBC: 0 % (ref 0.0–0.2)

## 2020-07-24 LAB — SURGICAL PCR SCREEN
MRSA, PCR: NEGATIVE
Staphylococcus aureus: NEGATIVE

## 2020-07-24 LAB — HEPARIN LEVEL (UNFRACTIONATED): Heparin Unfractionated: 0.89 IU/mL — ABNORMAL HIGH (ref 0.30–0.70)

## 2020-07-24 MED ORDER — ONDANSETRON HCL 4 MG/2ML IJ SOLN: 4.0000 mg | Freq: Four times a day (QID) | INTRAMUSCULAR | Status: DC | PRN

## 2020-07-24 MED ORDER — IPRATROPIUM-ALBUTEROL 0.5-2.5 (3) MG/3ML IN SOLN
3.0000 mL | Freq: Four times a day (QID) | RESPIRATORY_TRACT | Status: DC
  Administered 2020-07-24 – 2020-07-30 (×16): 3 mL via RESPIRATORY_TRACT
  Filled 2020-07-24 (×19): qty 3

## 2020-07-24 MED ORDER — FUROSEMIDE 10 MG/ML IJ SOLN
20.0000 mg | Freq: Once | INTRAMUSCULAR | Status: AC
  Administered 2020-07-24: 20 mg via INTRAVENOUS
  Filled 2020-07-24: qty 4

## 2020-07-24 MED ORDER — HEPARIN (PORCINE) 25000 UT/250ML-% IV SOLN
1700.0000 [IU]/h | INTRAVENOUS | Status: DC
  Administered 2020-07-24: 1700 [IU]/h via INTRAVENOUS
  Filled 2020-07-24: qty 250

## 2020-07-24 NOTE — Progress Notes (Signed)
CSW acknowledges consult for SNF placement. PT would have to recommend SNF. CSW will follow up after PT/OT evals if ordered.  Oleh Genin, [place]

## 2020-07-24 NOTE — Consult Note (Signed)
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 (on Eliquis PTA)  No Known Allergies  Patient Measurements: Height: 5\' 10"  (177.8 cm) Weight: 97.5 kg (215 lb) IBW/kg (Calculated) : 73 Heparin Dosing Weight: 93.1kg  Vital Signs: Temp: 97.9 F (36.6 C) (01/05 0008) Temp Source: Oral (01/05 0008) BP: 134/70 (01/05 0008) Pulse Rate: 88 (01/05 0008)  Labs: Recent Labs    07/23/20 0631 07/23/20 0751 07/23/20 1146 07/23/20 1927 07/24/20 0322  HGB 12.1*  --   --   --  11.7*  HCT 38.5*  --   --   --  37.2*  PLT 304  --   --   --  326  APTT  --  38*  --  87* 62*  LABPROT  --  15.0  --   --   --   INR  --  1.2  --   --   --   HEPARINUNFRC  --   --  1.21*  --  0.89*  CREATININE 0.58*  --   --   --  0.58*  CKTOTAL 50  --   --   --   --     Estimated Creatinine Clearance: 77.6 mL/min (A) (by C-G formula based on SCr of 0.58 mg/dL (L)).   Medications:  NKDA Heparin Dosing Weight: 93.1kg PTA: apixaban 5mg  BID  Assessment: 87yo M w/ PMH significant for Renal cell cancer, Lung cancer with metastasis to brain, liver & bone (Dx on 08/2017; s/p Rad Tx summer 2020), HTN, HLD, CAD, h/o stroke, OA, BPH, & GERD. 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 Pharmacy consulted to manage heparin infusion.  PLT: 304 Hgb: 12.1  Date Time HL/aPTT Comment 1/4 0751 APTT BL 38s 1/4 1146 HL 1.21 5500 unit bolus x1, heparin infusion initiated at 1500 units/hr 1/4 1927 APTT 87 Therapeutic x 1 1/3 0322 APTT 62 SUBtherapeutic  Goal of Therapy:  Heparin level 0.3-0.7 units/ml aPTT 66-102 seconds Monitor platelets by anticoagulation protocol: Yes   Plan:  Pt's last eliquis dose <72hrs prior. Baseline aPTT WNL, but Anti-Xa elevated from eliquis prior to infusion start. CTM via aPTT and check Anti-xa daily to assess for  correlation. Once correlating then use anti-xa.    APTT SUBtherapeutic. Increase  heparin infusion to 1700 units/hr   Recheck aPTT level in 8 hours and daily while on heparin   Continue to monitor H&H and platelets  Ena Dawley, PharmD Clinical Pharmacist  07/24/2020,4:06 AM

## 2020-07-24 NOTE — Consult Note (Signed)
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 (on Eliquis PTA)  No Known Allergies  Patient Measurements: Height: 5\' 10"  (177.8 cm) Weight: 97.5 kg (215 lb) IBW/kg (Calculated) : 73 Heparin Dosing Weight: 93.1kg  Vital Signs: Temp: 99 F (37.2 C) (01/05 2123) BP: 142/59 (01/05 2123) Pulse Rate: 67 (01/05 2132)  Labs: Recent Labs    07/23/20 0631 07/23/20 0751 07/23/20 1146 07/23/20 1927 07/24/20 0322 07/24/20 1220 07/24/20 2046  HGB 12.1*  --   --   --  11.7*  --   --   HCT 38.5*  --   --   --  37.2*  --   --   PLT 304  --   --   --  326  --   --   APTT  --  38*  --    < > 62* 91* 88*  LABPROT  --  15.0  --   --   --   --   --   INR  --  1.2  --   --   --   --   --   HEPARINUNFRC  --   --  1.21*  --  0.89*  --   --   CREATININE 0.58*  --   --   --  0.58*  --   --   CKTOTAL 50  --   --   --   --   --   --    < > = values in this interval not displayed.    Estimated Creatinine Clearance: 77.6 mL/min (A) (by C-G formula based on SCr of 0.58 mg/dL (L)).   Medications:  NKDA Heparin Dosing Weight: 93.1kg PTA: apixaban 5mg  BID  Assessment: 87yo M w/ PMH significant for Renal cell cancer, Lung cancer with metastasis to brain, liver & bone (Dx on 08/2017; s/p Rad Tx summer 2020), HTN, HLD, CAD, h/o stroke, OA, BPH, & GERD. 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 Pharmacy consulted to manage heparin infusion.  PLT: 304 Hgb: 12.1  Date Time HL/aPTT Comment 1/4 0751 APTT BL 38s 1/4 1146 HL 1.21 5500 unit bolus x1, heparin infusion initiated at 1500 units/hr 1/4 1927 APTT 87 Therapeutic x 1 1/5 0322 APTT 62 SUBtherapeutic 1/5  1220 APTT 91 Therapeutic x 1 1/5       2046    APTT 88          Therapeutic X 2   Goal of Therapy:  Heparin level 0.3-0.7 units/ml aPTT 66-102 seconds Monitor  platelets by anticoagulation protocol: Yes   Plan:  Pt's last eliquis dose <72hrs prior. Baseline aPTT WNL, but Anti-Xa elevated from eliquis prior to infusion start. CTM via aPTT and check Anti-xa daily to assess for correlation. Once correlating then use anti-xa.    APTT therapeutic x 2. Continue heparin 1700 units/hr   Will recheck HL, aPTT  on 1/6 with AM labs  Brandon D, PharmD Clinical Pharmacist  07/24/2020,10:17 PM

## 2020-07-24 NOTE — Consult Note (Signed)
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 (on Eliquis PTA)  No Known Allergies  Patient Measurements: Height: 5\' 10"  (177.8 cm) Weight: 97.5 kg (215 lb) IBW/kg (Calculated) : 73 Heparin Dosing Weight: 93.1kg  Vital Signs: Temp: 98.2 F (36.8 C) (01/05 1205) Temp Source: Oral (01/05 0426) BP: 128/65 (01/05 1205) Pulse Rate: 84 (01/05 1205)  Labs: Recent Labs    07/23/20 0631 07/23/20 0751 07/23/20 0751 07/23/20 1146 07/23/20 1927 07/24/20 0322 07/24/20 1220  HGB 12.1*  --   --   --   --  11.7*  --   HCT 38.5*  --   --   --   --  37.2*  --   PLT 304  --   --   --   --  326  --   APTT  --  38*   < >  --  87* 62* 91*  LABPROT  --  15.0  --   --   --   --   --   INR  --  1.2  --   --   --   --   --   HEPARINUNFRC  --   --   --  1.21*  --  0.89*  --   CREATININE 0.58*  --   --   --   --  0.58*  --   CKTOTAL 50  --   --   --   --   --   --    < > = values in this interval not displayed.    Estimated Creatinine Clearance: 77.6 mL/min (A) (by C-G formula based on SCr of 0.58 mg/dL (L)).   Medications:  NKDA Heparin Dosing Weight: 93.1kg PTA: apixaban 5mg  BID  Assessment: 87yo M w/ PMH significant for Renal cell cancer, Lung cancer with metastasis to brain, liver & bone (Dx on 08/2017; s/p Rad Tx summer 2020), HTN, HLD, CAD, h/o stroke, OA, BPH, & GERD. 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 Pharmacy consulted to manage heparin infusion.  PLT: 304 Hgb: 12.1  Date Time HL/aPTT Comment 1/4 0751 APTT BL 38s 1/4 1146 HL 1.21 5500 unit bolus x1, heparin infusion initiated at 1500 units/hr 1/4 1927 APTT 87 Therapeutic x 1 1/5 0322 APTT 62 SUBtherapeutic 1/5  1220 APTT 91 Therapeutic x 1  Goal of Therapy:  Heparin level 0.3-0.7 units/ml aPTT 66-102 seconds Monitor platelets by  anticoagulation protocol: Yes   Plan:  Pt's last eliquis dose <72hrs prior. Baseline aPTT WNL, but Anti-Xa elevated from eliquis prior to infusion start. CTM via aPTT and check Anti-xa daily to assess for correlation. Once correlating then use anti-xa.    APTT therapeutic x 1. Continue heparin 1700 units/hr   Recheck aPTT level in 8 hours and daily while on heparin   Continue to monitor H&H and platelets  Lu Duffel, PharmD Clinical Pharmacist  07/24/2020,12:56 PM

## 2020-07-24 NOTE — Progress Notes (Signed)
[facility] Encounter Note  Patient: [name] [name] / Admit Date: 07/23/2020 / Date of Encounter: 07/24/2020, 11:42 AM   Subjective: Patient did very well overnight with no evidence of angina, congestive heart failure, and/or shortness of breath in any manner.  The patient has had good blood pressure control with no evidence of significant cardiovascular concerns.  Patient's neurologic status has not changed.  He did have a chest x-ray yesterday showing right upper lobe changes possibly infiltrate but needs his hip fracture repaired and will need further treatment thereof or after.  Review of Systems: Positive for: Leg pain Negative for: Vision change, hearing change, syncope, dizziness, nausea, vomiting,diarrhea, bloody stool, stomach pain, cough, congestion, diaphoresis, urinary frequency, urinary pain,skin lesions, skin rashes Others previously listed  Objective: Telemetry: Normal sinus rhythm Physical Exam: Blood pressure 119/61, pulse (!) 104, temperature 98.5 F (36.9 C), resp. rate 16, height 5\' 10"  (1.778 m), weight 97.5 kg, SpO2 94 %. Body mass index is 30.85 kg/m. General: Well developed, well nourished, in no acute distress. Head: Normocephalic, atraumatic, sclera non-icteric, no xanthomas, nares are without discharge. Neck: No apparent masses Lungs: Normal respirations with no wheezes, some rhonchi, no rales , right upper lobe crackles   Heart: Regular rate and rhythm, normal S1 S2, no murmur, no rub, no gallop, PMI is normal size and placement, carotid upstroke normal without bruit, jugular venous pressure normal Abdomen: Soft, non-tender, non-distended with normoactive bowel sounds. No hepatosplenomegaly. Abdominal aorta is normal size without bruit Extremities: Trace edema, no clubbing, no cyanosis, no ulcers,  Peripheral: 2+ radial, 2+ femoral, 2+ dorsal pedal pulses Neuro: Alert and oriented. Moves all extremities spontaneously. Psych:  Responds to  questions appropriately with a normal affect.   Intake/Output Summary (Last 24 hours) at 07/24/2020 1142 Last data filed at 07/24/2020 1058 Gross per 24 hour  Intake 401.83 ml  Output 900 ml  Net -498.17 ml    Inpatient Medications:  . amLODipine  5 mg Oral q morning - 10a  . atorvastatin  10 mg Oral Daily  . feeding supplement  237 mL Oral TID BM  . ipratropium-albuterol  3 mL Nebulization Q6H  . lisinopril  40 mg Oral q morning - 10a  . metoprolol tartrate  50 mg Oral BID  . [START ON 07/25/2020] multivitamin with minerals  1 tablet Oral Daily  . zinc sulfate  220 mg Oral Daily   Infusions:  . heparin      Labs: Recent Labs    07/23/20 0631 07/24/20 0322  NA 137 138  K 3.5 3.6  CL 103 102  CO2 23 28  GLUCOSE 159* 124*  BUN 12 14  CREATININE 0.58* 0.58*  CALCIUM 8.4* 8.5*   Recent Labs    07/23/20 0631  AST 15  ALT 12  ALKPHOS 88  BILITOT 0.7  PROT 6.8  ALBUMIN 2.9*   Recent Labs    07/23/20 0631 07/24/20 0322  WBC 9.5 12.1*  NEUTROABS 8.0*  --   HGB 12.1* 11.7*  HCT 38.5* 37.2*  MCV 77.9* 78.8*  PLT 304 326   Recent Labs    07/23/20 0631  CKTOTAL 50   Invalid input(s): POCBNP No results for input(s): HGBA1C in the last 72 hours.   Weights: Filed Weights   07/23/20 8938  Weight: 97.5 kg     Radiology/Studies:  CT HEAD WO CONTRAST  Result Date: 07/23/2020 CLINICAL DATA:  Tripped and fell while walking to the bathroom this morning, struck back of head  history of metastatic renal cell carcinoma EXAM: CT HEAD WITHOUT CONTRAST TECHNIQUE: Contiguous axial images were obtained from the base of the skull through the vertex without intravenous contrast. Sagittal and coronal MPR images reconstructed from axial data set. COMPARISON:  07/09/2014 FINDINGS: Brain: Generalized atrophy. Normal ventricular morphology. No midline shift or mass effect. Small vessel chronic ischemic changes of deep cerebral white matter. No intracranial hemorrhage, mass lesion, or  evidence of acute infarction. No extra-axial fluid collections. Vascular: Atherosclerotic calcifications of internal carotid and vertebral arteries at skull base Skull: Demineralized but intact Sinuses/Orbits: Mucosal thickening in a few ethmoid air cells. Remaining visualized paranasal sinuses and mastoid air cells clear Other: N/A IMPRESSION: Atrophy with small vessel chronic ischemic changes of deep cerebral white matter. No acute intracranial abnormalities. Electronically Signed   By: Lavonia Dana M.D.   On: 07/23/2020 09:14   MR HIP RIGHT W WO CONTRAST  Result Date: 07/23/2020 CLINICAL DATA:  Fall with right hip fracture. History of renal cell carcinoma. EXAM: MRI OF THE RIGHT HIP WITHOUT AND WITH CONTRAST TECHNIQUE: Multiplanar, multisequence MR imaging was performed both before and after administration of intravenous contrast. CONTRAST:  7.22mL GADAVIST GADOBUTROL 1 MMOL/ML IV SOLN COMPARISON:  X-ray 07/23/2020 FINDINGS: Bones: Acute fracture of the right femoral neck with slight impaction and external rotation. Fracture line does not involve the intertrochanteric aspect of the femur or the humeral head articular surface. No additional fractures. No femoral head avascular necrosis. Destructive marrow replacing lesion centered within the lateral aspect of the medullary space of the intertrochanteric right femur measuring approximately 3.5 x 3.2 x 3.3 cm (series 4, image 21; series 6, image 16). There is cortical breakthrough along the lateral cortex with a contiguous enhancing extraosseous soft tissue mass which measures approximately 2.2 x 1.0 x 1.7 cm (series 6, image 14; series 8, image 20). There is marked bone marrow edema surrounding the mass. Mass occupies nearly 50% of the cross-sectional area of the femur. No complete pathologic fracture of the femur. Additional rounded 8 mm T1 hypointense, T2 hyperintense lesion within the posterior left iliac (series 3, image 11). Additional subcentimeter T2  hyperintense focus within the L4 vertebral body is nonspecific (series 3, image 20). Articular cartilage and labrum Articular cartilage: Mild diffuse chondral thinning and surface irregularity. Labrum:  Degenerated appearance of the superior labrum. Joint or bursal effusion Joint effusion:  No significant joint effusion. Bursae: No bursal fluid collections. Muscles and tendons Muscles and tendons: Intramuscular edema within the right gluteus minimus muscle. Prominent intramuscular edema and patchy enhancement within the proximal vastus lateralis and vastus intermediate muscles adjacent to the bone lesion. No acute tendinous injury. Other findings Miscellaneous: Prostate gland is enlarged with a heterogeneous, nodular configuration resulting in mass effect upon the base of the urinary bladder. Exophytic cyst emanating from the inferior pole of the left kidney measures approximately 2.1 cm. IMPRESSION: 1. Acute fracture of the right femoral neck with slight impaction and rotation. 2. Marrow replacing lesion within the intertrochanteric aspect of the proximal right femur measuring up to 3.5 cm. Cortical breakthrough along the lateral cortex with associated extra-osseous soft tissue mass. Surrounding bone marrow edema without pathologic fracture. Findings are highly suspicious for a metastatic lesion in this patient with known malignancy. 3. Intramuscular edema and enhancement within the proximal aspect of the vastus lateralis and vastus intermedius muscles adjacent to the proximal femoral mass, which may be reactive or reflect tumor infiltration. 4. Additional subcentimeter lesions within the posterior left iliac bone and L4  vertebral body, which may represent additional metastatic lesions. 5. Enlarged, heterogeneous, nodular configuration of the prostate gland resulting in mass effect upon the base of the urinary bladder. Correlate with serum PSA. These results will be called to the ordering clinician or  representative by the Radiologist Assistant, and communication documented in the PACS or [employer]. Electronically Signed   By: Davina Poke D.O.   On: 07/23/2020 10:51   DG Chest Port 1 View  Result Date: 07/23/2020 CLINICAL DATA:  Fall. EXAM: PORTABLE CHEST 1 VIEW COMPARISON:  CT 09/08/2017.  Chest x-ray 07/09/2014. FINDINGS: Mediastinum and hilar structures normal. Cardiomegaly. No pulmonary venous congestion. Prominent right upper lung infiltrate versus mass noted. Atelectatic changes left mid lung and left base. Tiny left pleural effusion cannot be excluded. No pneumothorax. Degenerative change scratched it diffuse osteopenia. Degenerative changes and scoliosis cervicothoracic spine. Left posterolateral seventh rib fracture cannot be completely excluded. IMPRESSION: 1. Prominent right upper lung infiltrate versus mass noted. Lung cancer cannot be excluded. Close follow-up chest x-rays to demonstrate clearing suggested. 2. Atelectatic changes left mid lung and left base. Tiny left pleural effusion cannot be excluded. 3. Cardiomegaly.  No pulmonary venous congestion. 4. Left posterolateral seventh rib fracture cannot be completely excluded. No pneumothorax. Electronically Signed   By: Marcello Moores  Register   On: 07/23/2020 07:16   DG Hip Unilat With Pelvis 2-3 Views Right  Result Date: 07/23/2020 CLINICAL DATA:  Right hip pain after fall. EXAM: DG HIP (WITH OR WITHOUT PELVIS) 2-3V RIGHT COMPARISON:  CT 08/25/2017. FINDINGS: Slightly displaced and angulated right femoral neck fracture. Diffuse osteopenia. Degenerative changes lumbar spine and both hips. Pelvic calcifications consistent phleboliths. Peripheral vascular calcification. IMPRESSION: 1.  Slightly displaced and angulated right femoral neck fracture. 2.  Peripheral vascular disease. Electronically Signed   By: Marcello Moores  Register   On: 07/23/2020 07:11     Assessment and Recommendation  87 y.o. male with known peripheral vascular disease  status post stroke with right residual hemiparesis hypertension hyperlipidemia on a previous appropriate medication management likely with a mechanical fall due to multiple issues listed above and no current evidence of acute coronary syndrome rhythm disturbances myocardial infarction angina or congestive heart failure currently at lowest risk possible for cardiovascular complication with hip surgical repair 1.  No further cardiac diagnostics necessary at this time 2.  Proceed to hip surgery without restriction 3.  Reinstatement of previous medication management for hypertension control and including metoprolol amlodipine lisinopril as before without change 4.  High intensity cholesterol therapy for previous peripheral vascular disease 5.  Reinstatement of antiplatelet medication management for previous stroke 6.  No restrictions to rehabilitation 7.  Call if further questions and/or concerns after surgery  Signed, Serafina Royals M.D. FACC

## 2020-07-24 NOTE — Progress Notes (Signed)
Patient ID: [name] [name], male   DOB: 1932-02-15, 87 y.o.   MRN: [id number] [facility] PROGRESS NOTE  [name] [name] OEV:[id number] DOB: Jun 12, 1932 DOA: 07/23/2020 PCP: [name] Body, MD  HPI/Subjective: Patient has some right hip pain.  Has some nausea vomiting.  Patient states that his right knee gave out and he fell down and admitted with right hip fracture.  Objective: Vitals:   07/24/20 0826 07/24/20 1205  BP: 119/61 128/65  Pulse: (!) 104 84  Resp: 16 17  Temp: 98.5 F (36.9 C) 98.2 F (36.8 C)  SpO2: 94% 91%    Intake/Output Summary (Last 24 hours) at 07/24/2020 1444 Last data filed at 07/24/2020 1418 Gross per 24 hour  Intake 641.83 ml  Output 900 ml  Net -258.17 ml   Filed Weights   07/23/20 0623  Weight: 97.5 kg    ROS: Review of Systems  Respiratory: Negative for shortness of breath.   Gastrointestinal: Negative for abdominal pain, nausea and vomiting.   Exam: Physical Exam HENT:     Head: Normocephalic.     Mouth/Throat:     Pharynx: No oropharyngeal exudate.  Eyes:     General: Lids are normal.     Conjunctiva/sclera: Conjunctivae normal.     Pupils: Pupils are equal, round, and reactive to light.  Cardiovascular:     Rate and Rhythm: Normal rate and regular rhythm.     Heart sounds: Normal heart sounds, S1 normal and S2 normal.  Pulmonary:     Breath sounds: Examination of the right-lower field reveals wheezing and rhonchi. Examination of the left-lower field reveals wheezing and rhonchi. Wheezing and rhonchi present. No decreased breath sounds or rales.  Abdominal:     Palpations: Abdomen is soft.     Tenderness: There is no abdominal tenderness.  Musculoskeletal:     Right lower leg: No swelling.     Left lower leg: No swelling.  Skin:    General: Skin is warm.     Findings: No rash.  Neurological:     Mental Status: He is alert and oriented to person, place, and time.       Data Reviewed: Basic Metabolic Panel: Recent Labs  Lab  07/23/20 0631 07/24/20 0322  NA 137 138  K 3.5 3.6  CL 103 102  CO2 23 28  GLUCOSE 159* 124*  BUN 12 14  CREATININE 0.58* 0.58*  CALCIUM 8.4* 8.5*   Liver Function Tests: Recent Labs  Lab 07/23/20 0631  AST 15  ALT 12  ALKPHOS 88  BILITOT 0.7  PROT 6.8  ALBUMIN 2.9*    CBC: Recent Labs  Lab 07/23/20 0631 07/24/20 0322  WBC 9.5 12.1*  NEUTROABS 8.0*  --   HGB 12.1* 11.7*  HCT 38.5* 37.2*  MCV 77.9* 78.8*  PLT 304 326   Cardiac Enzymes: Recent Labs  Lab 07/23/20 0631  CKTOTAL 50   BNP (last 3 results) Recent Labs    07/23/20 0631  BNP 71.8      Recent Results (from the past 240 hour(s))  Resp Panel by RT-PCR (Flu A&B, Covid) Nasopharyngeal Swab     Status: None   Collection Time: 07/23/20  6:50 AM   Specimen: Nasopharyngeal Swab; Nasopharyngeal(NP) swabs in vial transport medium  Result Value Ref Range Status   SARS Coronavirus 2 by RT PCR NEGATIVE NEGATIVE Final    Comment: (NOTE) SARS-CoV-2 target nucleic acids are NOT DETECTED.  The SARS-CoV-2 RNA is generally detectable in upper respiratory specimens during the  acute phase of infection. The lowest concentration of SARS-CoV-2 viral copies this assay can detect is 138 copies/mL. A negative result does not preclude SARS-Cov-2 infection and should not be used as the sole basis for treatment or other patient management decisions. A negative result may occur with  improper specimen collection/handling, submission of specimen other than nasopharyngeal swab, presence of viral mutation(s) within the areas targeted by this assay, and inadequate number of viral copies(<138 copies/mL). A negative result must be combined with clinical observations, patient history, and epidemiological information. The expected result is Negative.  Fact Sheet for Patients:  EntrepreneurPulse.com.au  Fact Sheet for Healthcare Providers:  IncredibleEmployment.be  This test is no t yet  approved or cleared by the [place] FDA and  has been authorized for detection and/or diagnosis of SARS-CoV-2 by FDA under an Emergency Use Authorization (EUA). This EUA will remain  in effect (meaning this test can be used) for the duration of the COVID-19 declaration under Section 564(b)(1) of the Act, 21 U.S.C.section 360bbb-3(b)(1), unless the authorization is terminated  or revoked sooner.       Influenza A by PCR NEGATIVE NEGATIVE Final   Influenza B by PCR NEGATIVE NEGATIVE Final    Comment: (NOTE) The Xpert Xpress SARS-CoV-2/FLU/RSV plus assay is intended as an aid in the diagnosis of influenza from Nasopharyngeal swab specimens and should not be used as a sole basis for treatment. Nasal washings and aspirates are unacceptable for Xpert Xpress SARS-CoV-2/FLU/RSV testing.  Fact Sheet for Patients: EntrepreneurPulse.com.au  Fact Sheet for Healthcare Providers: IncredibleEmployment.be  This test is not yet approved or cleared by the [place] FDA and has been authorized for detection and/or diagnosis of SARS-CoV-2 by FDA under an Emergency Use Authorization (EUA). This EUA will remain in effect (meaning this test can be used) for the duration of the COVID-19 declaration under Section 564(b)(1) of the Act, 21 U.S.C. section 360bbb-3(b)(1), unless the authorization is terminated or revoked.  Performed at [facility], [address], [place], [place] [address]   Surgical PCR screen     Status: None   Collection Time: 07/24/20 11:12 AM   Specimen: Nasal Mucosa; Nasal Swab  Result Value Ref Range Status   MRSA, PCR NEGATIVE NEGATIVE Final   Staphylococcus aureus NEGATIVE NEGATIVE Final    Comment: (NOTE) The Xpert SA Assay (FDA approved for NASAL specimens in patients 29 years of age and older), is one component of a comprehensive surveillance program. It is not intended to diagnose infection nor to guide or monitor  treatment. Performed at [facility], [address] [place], [place] [address]      Studies: CT HEAD WO CONTRAST  Result Date: 07/23/2020 CLINICAL DATA:  Tripped and fell while walking to the bathroom this morning, struck back of head history of metastatic renal cell carcinoma EXAM: CT HEAD WITHOUT CONTRAST TECHNIQUE: Contiguous axial images were obtained from the base of the skull through the vertex without intravenous contrast. Sagittal and coronal MPR images reconstructed from axial data set. COMPARISON:  07/09/2014 FINDINGS: Brain: Generalized atrophy. Normal ventricular morphology. No midline shift or mass effect. Small vessel chronic ischemic changes of deep cerebral white matter. No intracranial hemorrhage, mass lesion, or evidence of acute infarction. No extra-axial fluid collections. Vascular: Atherosclerotic calcifications of internal carotid and vertebral arteries at skull base Skull: Demineralized but intact Sinuses/Orbits: Mucosal thickening in a few ethmoid air cells. Remaining visualized paranasal sinuses and mastoid air cells clear Other: N/A IMPRESSION: Atrophy with small vessel chronic  ischemic changes of deep cerebral white matter. No acute intracranial abnormalities. Electronically Signed   By: Lavonia Dana M.D.   On: 07/23/2020 09:14   MR HIP RIGHT W WO CONTRAST  Result Date: 07/23/2020 CLINICAL DATA:  Fall with right hip fracture. History of renal cell carcinoma. EXAM: MRI OF THE RIGHT HIP WITHOUT AND WITH CONTRAST TECHNIQUE: Multiplanar, multisequence MR imaging was performed both before and after administration of intravenous contrast. CONTRAST:  7.53mL GADAVIST GADOBUTROL 1 MMOL/ML IV SOLN COMPARISON:  X-ray 07/23/2020 FINDINGS: Bones: Acute fracture of the right femoral neck with slight impaction and external rotation. Fracture line does not involve the intertrochanteric aspect of the femur or the humeral head articular surface. No additional fractures. No femoral head  avascular necrosis. Destructive marrow replacing lesion centered within the lateral aspect of the medullary space of the intertrochanteric right femur measuring approximately 3.5 x 3.2 x 3.3 cm (series 4, image 21; series 6, image 16). There is cortical breakthrough along the lateral cortex with a contiguous enhancing extraosseous soft tissue mass which measures approximately 2.2 x 1.0 x 1.7 cm (series 6, image 14; series 8, image 20). There is marked bone marrow edema surrounding the mass. Mass occupies nearly 50% of the cross-sectional area of the femur. No complete pathologic fracture of the femur. Additional rounded 8 mm T1 hypointense, T2 hyperintense lesion within the posterior left iliac (series 3, image 11). Additional subcentimeter T2 hyperintense focus within the L4 vertebral [name] is nonspecific (series 3, image 20). Articular cartilage and labrum Articular cartilage: Mild diffuse chondral thinning and surface irregularity. Labrum:  Degenerated appearance of the superior labrum. Joint or bursal effusion Joint effusion:  No significant joint effusion. Bursae: No bursal fluid collections. Muscles and tendons Muscles and tendons: Intramuscular edema within the right gluteus minimus muscle. Prominent intramuscular edema and patchy enhancement within the proximal vastus lateralis and vastus intermediate muscles adjacent to the bone lesion. No acute tendinous injury. Other findings Miscellaneous: Prostate gland is enlarged with a heterogeneous, nodular configuration resulting in mass effect upon the base of the urinary bladder. Exophytic cyst emanating from the inferior pole of the left kidney measures approximately 2.1 cm. IMPRESSION: 1. Acute fracture of the right femoral neck with slight impaction and rotation. 2. Marrow replacing lesion within the intertrochanteric aspect of the proximal right femur measuring up to 3.5 cm. Cortical breakthrough along the lateral cortex with associated extra-osseous soft  tissue mass. Surrounding bone marrow edema without pathologic fracture. Findings are highly suspicious for a metastatic lesion in this patient with known malignancy. 3. Intramuscular edema and enhancement within the proximal aspect of the vastus lateralis and vastus intermedius muscles adjacent to the proximal femoral mass, which may be reactive or reflect tumor infiltration. 4. Additional subcentimeter lesions within the posterior left iliac bone and L4 vertebral [name], which may represent additional metastatic lesions. 5. Enlarged, heterogeneous, nodular configuration of the prostate gland resulting in mass effect upon the base of the urinary bladder. Correlate with serum PSA. These results will be called to the ordering clinician or representative by the Radiologist Assistant, and communication documented in the PACS or [employer]. Electronically Signed   By: Davina Poke D.O.   On: 07/23/2020 10:51   DG Chest Port 1 View  Result Date: 07/23/2020 CLINICAL DATA:  Fall. EXAM: PORTABLE CHEST 1 VIEW COMPARISON:  CT 09/08/2017.  Chest x-ray 07/09/2014. FINDINGS: Mediastinum and hilar structures normal. Cardiomegaly. No pulmonary venous congestion. Prominent right upper lung infiltrate versus mass noted. Atelectatic changes left mid  lung and left base. Tiny left pleural effusion cannot be excluded. No pneumothorax. Degenerative change scratched it diffuse osteopenia. Degenerative changes and scoliosis cervicothoracic spine. Left posterolateral seventh rib fracture cannot be completely excluded. IMPRESSION: 1. Prominent right upper lung infiltrate versus mass noted. Lung cancer cannot be excluded. Close follow-up chest x-rays to demonstrate clearing suggested. 2. Atelectatic changes left mid lung and left base. Tiny left pleural effusion cannot be excluded. 3. Cardiomegaly.  No pulmonary venous congestion. 4. Left posterolateral seventh rib fracture cannot be completely excluded. No pneumothorax.  Electronically Signed   By: Marcello Moores  Register   On: 07/23/2020 07:16   DG Hip Unilat With Pelvis 2-3 Views Right  Result Date: 07/23/2020 CLINICAL DATA:  Right hip pain after fall. EXAM: DG HIP (WITH OR WITHOUT PELVIS) 2-3V RIGHT COMPARISON:  CT 08/25/2017. FINDINGS: Slightly displaced and angulated right femoral neck fracture. Diffuse osteopenia. Degenerative changes lumbar spine and both hips. Pelvic calcifications consistent phleboliths. Peripheral vascular calcification. IMPRESSION: 1.  Slightly displaced and angulated right femoral neck fracture. 2.  Peripheral vascular disease. Electronically Signed   By: Marcello Moores  Register   On: 07/23/2020 07:11    Scheduled Meds: . amLODipine  5 mg Oral q morning - 10a  . atorvastatin  10 mg Oral Daily  . feeding supplement  237 mL Oral TID BM  . ipratropium-albuterol  3 mL Nebulization Q6H  . lisinopril  40 mg Oral q morning - 10a  . metoprolol tartrate  50 mg Oral BID  . [START ON 07/25/2020] multivitamin with minerals  1 tablet Oral Daily  . zinc sulfate  220 mg Oral Daily   Continuous Infusions: . heparin 1,700 Units/hr (07/24/20 1322)    Assessment/Plan:  1. Closed right femoral neck fracture requiring operative repair.  Continue to hold Eliquis.  Case discussed with Dr. Mack Guise orthopedic surgery and they will delay surgery until tomorrow for Eliquis to get out of his system. 2. Nausea and vomiting.  Could be secondary to pain medications.  I ordered as needed Zofran. 3. Previous mural thrombus of the heart.  On IV heparin.  Will go back to Eliquis postoperatively once stable. 4. History of stroke on Eliquis 5. Hyperlipidemia unspecified on Lipitor 6. History of CAD on Lipitor and metoprolol. 7. Right knee giving out on him and that is one of the reasons why he fell.  Orthopedic following. 8. History of renal cancer and melanoma.  X-ray also seeing area on his lung.  Looking back in 2019 also did see area in his right upper lobe on CT  scan 9. Wheeze this morning.  Given nebulizer treatments and 1 dose of Lasix.        Code Status:     Code Status Orders  (From admission, onward)         Start     Ordered   07/23/20 1436  Do not attempt resuscitation (DNR)  Continuous        07/23/20 1435        Code Status History    Date Active Date Inactive Code Status Order ID Comments User Context   07/23/2020 1141 07/23/2020 1435 Full Code [id number]  Ivor Costa, MD ED   07/23/2020 0901 07/23/2020 1141 DNR [id number]  Ivor Costa, MD ED   Advance Care Planning Activity     Family Communication: Spoke with son on the phone Disposition Plan: Status is: Inpatient  Dispo: The patient is from: Home  Anticipated d/c is to: Rehab on postoperative day 3              Anticipated d/c date is: Rehab on postoperative day 3.  Surgery will be on 07/25/2020.              Patient currently will require right hip repair.  Time spent: 27 minutes  [place]

## 2020-07-24 NOTE — Consult Note (Signed)
ORTHOPAEDIC CONSULTATION  REQUESTING PHYSICIAN: Loletha Grayer, MD  Chief Complaint: Right hip pain status post fall  HPI: Patient was seen and examined this morning at 8:45 AM.  [name] [name] is a 87 y.o. male who complains of right hip pain after a fall at home returning to bed from the restroom.  Patient states his right knee gave out causing him to fall.  Patient has a history of bilateral knee replacements.  Patient also has a history of metastatic lung cancer with known osseous metastasis.  Patient was diagnosed with a right femoral neck hip fracture by x-ray upon arrival to [facility] ED.  An MRI was performed of the right hip as there was question of the lytic lesions in the right femur.  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  Past Medical History:  Diagnosis Date  . Arthritis   . Cancer ([place]) 45 years ago melanoma  . Enlarged prostate 11/21/14  . GERD (gastroesophageal reflux disease)   . History of kidney stones   . Hypertension   . Renal cancer ([place])   . Stroke [facility]) [id number]   slight weakness RT side   Past Surgical History:  Procedure Laterality Date  . CHOLECYSTECTOMY N/A   . CYSTOSCOPY WITH LITHOLAPAXY N/A 09/30/2015   Procedure: CYSTOSCOPY WITH LITHOLAPAXY;  Surgeon: Hollice Espy, MD;  Location: [facility] ORS;  Service: Urology;  Laterality: N/A;  . HOLEP-LASER ENUCLEATION OF THE PROSTATE WITH MORCELLATION N/A 09/30/2015   Procedure: HOLEP-LASER ENUCLEATION OF THE PROSTATE WITH MORCELLATION;  Surgeon: Hollice Espy, MD;  Location: [facility] ORS;  Service: Urology;  Laterality: N/A;  . IR GENERIC HISTORICAL  09/08/2016   IR RADIOLOGIST EVAL & MGMT 09/08/2016 Greggory Keen, MD [facility] INTERV RAD  . JOINT REPLACEMENT Bilateral 4-5 years ago   kness replaced   . LAPAROTOMY  2004   1 week after choley "kinicked bile duct"  . SKIN CANCER EXCISION  1973   Social History   Socioeconomic History  . Marital status: Single    Spouse name: Not on  file  . Number of children: Not on file  . Years of education: Not on file  . Highest education level: Not on file  Occupational History  . Not on file  Tobacco Use  . Smoking status: Former Smoker    Types: Cigarettes    Quit date: 11/20/1968    Years since quitting: 51.7  . Smokeless tobacco: Never Used  Substance and Sexual Activity  . Alcohol use: No    Alcohol/week: 0.0 standard drinks  . Drug use: No  . Sexual activity: Not on file  Other Topics Concern  . Not on file  Social History Narrative  . Not on file   Social Determinants of Health   Financial Resource Strain: Not on file  Food Insecurity: Not on file  Transportation Needs: Not on file  Physical Activity: Not on file  Stress: Not on file  Social Connections: Not on file   Family History  Problem Relation Age of Onset  . Cancer Brother   . Cancer Sister   . Cancer Sister   . Cancer - Prostate Brother    No Known Allergies Prior to Admission medications   Medication Sig Start Date End Date Taking? Authorizing Provider  amLODipine (NORVASC) 5 MG tablet Take 5 mg by mouth every morning.    Yes [provider]  apixaban (ELIQUIS) 5 MG TABS tablet Take 5 mg by mouth 2 (two) times daily. 02/25/19  Yes [provider]  atorvastatin (LIPITOR) 10 MG tablet Take 10 mg by mouth daily.   Yes [provider]  beta carotene 25000 UNIT capsule Take 25,000 Units by mouth daily.   Yes [provider]  lisinopril (PRINIVIL,ZESTRIL) 40 MG tablet Take 40 mg by mouth every morning.    Yes [provider]  meloxicam (MOBIC) 15 MG tablet TAKE 1 TABLET BY MOUTH ONCE DAILY WITH BREAKFAST 06/28/18  Yes [provider]  metoprolol (LOPRESSOR) 50 MG tablet Take 50 mg by mouth 2 (two) times daily.   Yes [provider]  potassium chloride SA (K-DUR,KLOR-CON) 20 MEQ tablet Take 20 mEq by mouth 2 (two) times daily.   Yes [provider]  Zinc 50 MG TABS Take 50 mg by  mouth daily.   Yes [provider]   CT HEAD WO CONTRAST  Result Date: 07/23/2020 CLINICAL DATA:  Tripped and fell while walking to the bathroom this morning, struck back of head history of metastatic renal cell carcinoma EXAM: CT HEAD WITHOUT CONTRAST TECHNIQUE: Contiguous axial images were obtained from the base of the skull through the vertex without intravenous contrast. Sagittal and coronal MPR images reconstructed from axial data set. COMPARISON:  07/09/2014 FINDINGS: Brain: Generalized atrophy. Normal ventricular morphology. No midline shift or mass effect. Small vessel chronic ischemic changes of deep cerebral white matter. No intracranial hemorrhage, mass lesion, or evidence of acute infarction. No extra-axial fluid collections. Vascular: Atherosclerotic calcifications of internal carotid and vertebral arteries at skull base Skull: Demineralized but intact Sinuses/Orbits: Mucosal thickening in a few ethmoid air cells. Remaining visualized paranasal sinuses and mastoid air cells clear Other: N/A IMPRESSION: Atrophy with small vessel chronic ischemic changes of deep cerebral white matter. No acute intracranial abnormalities. Electronically Signed   By: Lavonia Dana M.D.   On: 07/23/2020 09:14   MR HIP RIGHT W WO CONTRAST  Result Date: 07/23/2020 CLINICAL DATA:  Fall with right hip fracture. History of renal cell carcinoma. EXAM: MRI OF THE RIGHT HIP WITHOUT AND WITH CONTRAST TECHNIQUE: Multiplanar, multisequence MR imaging was performed both before and after administration of intravenous contrast. CONTRAST:  7.77mL GADAVIST GADOBUTROL 1 MMOL/ML IV SOLN COMPARISON:  X-ray 07/23/2020 FINDINGS: Bones: Acute fracture of the right femoral neck with slight impaction and external rotation. Fracture line does not involve the intertrochanteric aspect of the femur or the humeral head articular surface. No additional fractures. No femoral head avascular necrosis. Destructive marrow replacing lesion centered  within the lateral aspect of the medullary space of the intertrochanteric right femur measuring approximately 3.5 x 3.2 x 3.3 cm (series 4, image 21; series 6, image 16). There is cortical breakthrough along the lateral cortex with a contiguous enhancing extraosseous soft tissue mass which measures approximately 2.2 x 1.0 x 1.7 cm (series 6, image 14; series 8, image 20). There is marked bone marrow edema surrounding the mass. Mass occupies nearly 50% of the cross-sectional area of the femur. No complete pathologic fracture of the femur. Additional rounded 8 mm T1 hypointense, T2 hyperintense lesion within the posterior left iliac (series 3, image 11). Additional subcentimeter T2 hyperintense focus within the L4 vertebral body is nonspecific (series 3, image 20). Articular cartilage and labrum Articular cartilage: Mild diffuse chondral thinning and surface irregularity. Labrum:  Degenerated appearance of the superior labrum. Joint or bursal effusion Joint effusion:  No significant joint effusion. Bursae: No bursal fluid collections. Muscles and tendons Muscles and tendons: Intramuscular edema within the right gluteus minimus muscle. Prominent  intramuscular edema and patchy enhancement within the proximal vastus lateralis and vastus intermediate muscles adjacent to the bone lesion. No acute tendinous injury. Other findings Miscellaneous: Prostate gland is enlarged with a heterogeneous, nodular configuration resulting in mass effect upon the base of the urinary bladder. Exophytic cyst emanating from the inferior pole of the left kidney measures approximately 2.1 cm. IMPRESSION: 1. Acute fracture of the right femoral neck with slight impaction and rotation. 2. Marrow replacing lesion within the intertrochanteric aspect of the proximal right femur measuring up to 3.5 cm. Cortical breakthrough along the lateral cortex with associated extra-osseous soft tissue mass. Surrounding bone marrow edema without pathologic  fracture. Findings are highly suspicious for a metastatic lesion in this patient with known malignancy. 3. Intramuscular edema and enhancement within the proximal aspect of the vastus lateralis and vastus intermedius muscles adjacent to the proximal femoral mass, which may be reactive or reflect tumor infiltration. 4. Additional subcentimeter lesions within the posterior left iliac bone and L4 vertebral body, which may represent additional metastatic lesions. 5. Enlarged, heterogeneous, nodular configuration of the prostate gland resulting in mass effect upon the base of the urinary bladder. Correlate with serum PSA. These results will be called to the ordering clinician or representative by the Radiologist Assistant, and communication documented in the PACS or [employer]. Electronically Signed   By: Davina Poke D.O.   On: 07/23/2020 10:51   DG Chest Port 1 View  Result Date: 07/23/2020 CLINICAL DATA:  Fall. EXAM: PORTABLE CHEST 1 VIEW COMPARISON:  CT 09/08/2017.  Chest x-ray 07/09/2014. FINDINGS: Mediastinum and hilar structures normal. Cardiomegaly. No pulmonary venous congestion. Prominent right upper lung infiltrate versus mass noted. Atelectatic changes left mid lung and left base. Tiny left pleural effusion cannot be excluded. No pneumothorax. Degenerative change scratched it diffuse osteopenia. Degenerative changes and scoliosis cervicothoracic spine. Left posterolateral seventh rib fracture cannot be completely excluded. IMPRESSION: 1. Prominent right upper lung infiltrate versus mass noted. Lung cancer cannot be excluded. Close follow-up chest x-rays to demonstrate clearing suggested. 2. Atelectatic changes left mid lung and left base. Tiny left pleural effusion cannot be excluded. 3. Cardiomegaly.  No pulmonary venous congestion. 4. Left posterolateral seventh rib fracture cannot be completely excluded. No pneumothorax. Electronically Signed   By: Marcello Moores  Register   On: 07/23/2020 07:16    DG Hip Unilat With Pelvis 2-3 Views Right  Result Date: 07/23/2020 CLINICAL DATA:  Right hip pain after fall. EXAM: DG HIP (WITH OR WITHOUT PELVIS) 2-3V RIGHT COMPARISON:  CT 08/25/2017. FINDINGS: Slightly displaced and angulated right femoral neck fracture. Diffuse osteopenia. Degenerative changes lumbar spine and both hips. Pelvic calcifications consistent phleboliths. Peripheral vascular calcification. IMPRESSION: 1.  Slightly displaced and angulated right femoral neck fracture. 2.  Peripheral vascular disease. Electronically Signed   By: Marcello Moores  Register   On: 07/23/2020 07:11    Positive ROS: All other systems have been reviewed and were otherwise negative with the exception of those mentioned in the HPI and as above.  Physical Exam: General: Alert, no acute distress Cardiovascular: No pedal edema Respiratory: No cyanosis, no use of accessory musculature GI: No organomegaly, abdomen is soft and non-tender Skin: No lesions in the area of chief complaint Neurologic: Sensation intact distally Psychiatric: Patient is competent for consent with normal mood and affect Lymphatic: No axillary or cervical lymphadenopathy  MUSCULOSKELETAL: Right lower extremity: Patient skin is intact.  There is no significant swelling, ecchymosis or erythema seen.  There is shortening and external rotation of the right  lower extremity.  His thigh and leg compartments are soft and compressible.  Distally is neurovascular intact with palpable pedal pulses, intact sensation light touch and he can flex and extend his toes and dorsiflex and plantarflex his ankle.  Assessment: Right femoral neck hip fracture Right proximal femoral metastatic lesion  Plan: I explained to the patient that he has a fracture to the right femoral neck.  I am recommending hemiarthroplasty for treatment of this fracture.  Patient had an intramedullary lesion seen on MRI.  For this reason the decision was made to proceed with a cemented  hemiarthroplasty.  Patient is on Eliquis for atrial fibrillation.  His last dose was Monday night.  His surgery will therefore be scheduled for Thursday after 3 days off his Eliquis.  Patient is currently on a heparin drip in the meantime.  I discussed the risks and benefits of surgery. The risks include but are not limited to infection, bleeding requiring blood transfusion, nerve or blood vessel injury, joint stiffness or loss of motion, persistent pain, weakness or instability, leg length discrepancy, change in lower extremity rotation, fracture, dislocation hardware failure and the need for further surgery. Medical risks include but are not limited to DVT and pulmonary embolism, myocardial infarction, stroke, pneumonia, respiratory failure and death. Patient understood these risks and wished to proceed.   Patient is cleared for surgery by the hospital service.  I reviewed the patient's labs and radiographic studies in preparation for this case.  Thornton Park, MD    07/24/2020 2:41 PM

## 2020-07-25 ENCOUNTER — Inpatient Hospital Stay: Admitting: Anesthesiology

## 2020-07-25 ENCOUNTER — Inpatient Hospital Stay

## 2020-07-25 ENCOUNTER — Encounter
Admission: EM | Disposition: A | Discharge status: Skilled Nursing Facility | Payer: Self-pay | Source: Home / Self Care | Attending: Internal Medicine

## 2020-07-25 ENCOUNTER — Encounter: Payer: Self-pay | Admitting: Internal Medicine

## 2020-07-25 DIAGNOSIS — S72001A Fracture of unspecified part of neck of right femur, initial encounter for closed fracture: Secondary | ICD-10-CM | POA: Diagnosis not present

## 2020-07-25 DIAGNOSIS — R112 Nausea with vomiting, unspecified: Secondary | ICD-10-CM | POA: Diagnosis not present

## 2020-07-25 DIAGNOSIS — C7801 Secondary malignant neoplasm of right lung: Secondary | ICD-10-CM

## 2020-07-25 DIAGNOSIS — I513 Intracardiac thrombosis, not elsewhere classified: Secondary | ICD-10-CM | POA: Diagnosis not present

## 2020-07-25 DIAGNOSIS — Z8673 Personal history of transient ischemic attack (TIA), and cerebral infarction without residual deficits: Secondary | ICD-10-CM | POA: Diagnosis not present

## 2020-07-25 HISTORY — PX: HIP ARTHROPLASTY: SHX981

## 2020-07-25 LAB — CBC
HCT: 36.5 % — ABNORMAL LOW (ref 39.0–52.0)
Hemoglobin: 11.9 g/dL — ABNORMAL LOW (ref 13.0–17.0)
MCH: 25.2 pg — ABNORMAL LOW (ref 26.0–34.0)
MCHC: 32.6 g/dL (ref 30.0–36.0)
MCV: 77.3 fL — ABNORMAL LOW (ref 80.0–100.0)
Platelets: 293 10*3/uL (ref 150–400)
RBC: 4.72 MIL/uL (ref 4.22–5.81)
RDW: 15.1 % (ref 11.5–15.5)
WBC: 9.6 10*3/uL (ref 4.0–10.5)
nRBC: 0 % (ref 0.0–0.2)

## 2020-07-25 LAB — BASIC METABOLIC PANEL
Anion gap: 9 (ref 5–15)
BUN: 18 mg/dL (ref 8–23)
CO2: 30 mmol/L (ref 22–32)
Calcium: 8 mg/dL — ABNORMAL LOW (ref 8.9–10.3)
Chloride: 98 mmol/L (ref 98–111)
Creatinine, Ser: 0.62 mg/dL (ref 0.61–1.24)
GFR, Estimated: >60 mL/min (ref >60)
Glucose, Bld: 99 mg/dL (ref 70–99)
Potassium: 3.3 mmol/L — ABNORMAL LOW (ref 3.5–5.1)
Sodium: 137 mmol/L (ref 135–145)

## 2020-07-25 LAB — APTT: aPTT: 39 seconds — ABNORMAL HIGH (ref 24–36)

## 2020-07-25 LAB — HEPARIN LEVEL (UNFRACTIONATED): Heparin Unfractionated: 0.19 IU/mL — ABNORMAL LOW (ref 0.30–0.70)

## 2020-07-25 SURGERY — HEMIARTHROPLASTY, HIP, DIRECT ANTERIOR APPROACH, FOR FRACTURE
Anesthesia: Spinal | Site: Hip | Laterality: Right

## 2020-07-25 MED ORDER — ACETAMINOPHEN 325 MG PO TABS: 325.0000 mg | ORAL_TABLET | Freq: Four times a day (QID) | ORAL | Status: DC | PRN

## 2020-07-25 MED ORDER — CEFAZOLIN SODIUM-DEXTROSE 2-4 GM/100ML-% IV SOLN
2.0000 g | INTRAVENOUS | Status: AC
  Administered 2020-07-25: 2 g via INTRAVENOUS

## 2020-07-25 MED ORDER — PHENYLEPHRINE HCL (PRESSORS) 10 MG/ML IV SOLN
INTRAVENOUS | Status: DC | PRN
  Administered 2020-07-25 (×4): 100 ug via INTRAVENOUS

## 2020-07-25 MED ORDER — MORPHINE SULFATE (PF) 2 MG/ML IV SOLN: 0.5000 mg | INTRAVENOUS | Status: DC | PRN

## 2020-07-25 MED ORDER — HALOPERIDOL LACTATE 5 MG/ML IJ SOLN
1.0000 mg | Freq: Four times a day (QID) | INTRAMUSCULAR | Status: DC | PRN
  Filled 2020-07-25: qty 0.2

## 2020-07-25 MED ORDER — OXYCODONE HCL 5 MG/5ML PO SOLN: 5.0000 mg | Freq: Once | ORAL | Status: DC | PRN

## 2020-07-25 MED ORDER — PROPOFOL 500 MG/50ML IV EMUL
INTRAVENOUS | Status: DC | PRN
  Administered 2020-07-25: 100 ug/kg/min via INTRAVENOUS

## 2020-07-25 MED ORDER — FUROSEMIDE 10 MG/ML IJ SOLN
10.0000 mg | Freq: Once | INTRAMUSCULAR | Status: AC
  Administered 2020-07-25: 10 mg via INTRAVENOUS

## 2020-07-25 MED ORDER — PROPOFOL 10 MG/ML IV BOLUS
INTRAVENOUS | Status: DC | PRN
  Administered 2020-07-25: 20 mg via INTRAVENOUS
  Administered 2020-07-25: 30 mg via INTRAVENOUS
  Administered 2020-07-25: 20 mg via INTRAVENOUS

## 2020-07-25 MED ORDER — DEXAMETHASONE SODIUM PHOSPHATE 10 MG/ML IJ SOLN
INTRAMUSCULAR | Status: DC | PRN
  Administered 2020-07-25: 10 mg via INTRAVENOUS

## 2020-07-25 MED ORDER — ACETAMINOPHEN 10 MG/ML IV SOLN
INTRAVENOUS | Status: DC | PRN
  Administered 2020-07-25: 1000 mg via INTRAVENOUS

## 2020-07-25 MED ORDER — LIDOCAINE HCL URETHRAL/MUCOSAL 2 % EX GEL
CUTANEOUS | Status: DC | PRN
  Administered 2020-07-25: 1 via URETHRAL

## 2020-07-25 MED ORDER — ONDANSETRON HCL 4 MG/2ML IJ SOLN: 4.0000 mg | Freq: Once | INTRAMUSCULAR | Status: DC | PRN

## 2020-07-25 MED ORDER — SENNA 8.6 MG PO TABS
1.0000 | ORAL_TABLET | Freq: Two times a day (BID) | ORAL | Status: DC
  Administered 2020-07-25 – 2020-07-30 (×9): 8.6 mg via ORAL
  Filled 2020-07-25 (×10): qty 1

## 2020-07-25 MED ORDER — IPRATROPIUM-ALBUTEROL 0.5-2.5 (3) MG/3ML IN SOLN
RESPIRATORY_TRACT | Status: AC
  Filled 2020-07-25: qty 3

## 2020-07-25 MED ORDER — FENTANYL CITRATE (PF) 100 MCG/2ML IJ SOLN
INTRAMUSCULAR | Status: DC | PRN
  Administered 2020-07-25: 50 ug via INTRAVENOUS

## 2020-07-25 MED ORDER — TRAMADOL HCL 50 MG PO TABS
50.0000 mg | ORAL_TABLET | Freq: Four times a day (QID) | ORAL | Status: DC
Start: 2020-07-25 — End: 2020-07-26
  Administered 2020-07-25: 50 mg via ORAL
  Filled 2020-07-25: qty 1

## 2020-07-25 MED ORDER — OXYCODONE HCL 5 MG PO TABS: 5.0000 mg | ORAL_TABLET | Freq: Once | ORAL | Status: DC | PRN

## 2020-07-25 MED ORDER — APIXABAN 5 MG PO TABS
5.0000 mg | ORAL_TABLET | Freq: Two times a day (BID) | ORAL | Status: DC
  Administered 2020-07-26 – 2020-07-30 (×9): 5 mg via ORAL
  Filled 2020-07-25 (×9): qty 1

## 2020-07-25 MED ORDER — SODIUM CHLORIDE 0.9 % IV SOLN
75.0000 mL/h | INTRAVENOUS | Status: DC
  Administered 2020-07-25: 75 mL/h via INTRAVENOUS

## 2020-07-25 MED ORDER — FENTANYL CITRATE (PF) 100 MCG/2ML IJ SOLN: 25.0000 ug | INTRAMUSCULAR | Status: DC | PRN

## 2020-07-25 MED ORDER — FUROSEMIDE 10 MG/ML IJ SOLN
20.0000 mg | Freq: Once | INTRAMUSCULAR | Status: AC
  Administered 2020-07-25: 20 mg via INTRAVENOUS
  Filled 2020-07-25: qty 4

## 2020-07-25 MED ORDER — CEFAZOLIN SODIUM-DEXTROSE 2-4 GM/100ML-% IV SOLN
2.0000 g | Freq: Four times a day (QID) | INTRAVENOUS | Status: AC
  Administered 2020-07-25 – 2020-07-26 (×2): 2 g via INTRAVENOUS
  Filled 2020-07-25 (×3): qty 100

## 2020-07-25 MED ORDER — IPRATROPIUM-ALBUTEROL 0.5-2.5 (3) MG/3ML IN SOLN
RESPIRATORY_TRACT | Status: AC
  Administered 2020-07-25: 3 mL via RESPIRATORY_TRACT
  Filled 2020-07-25: qty 3

## 2020-07-25 MED ORDER — FENTANYL CITRATE (PF) 100 MCG/2ML IJ SOLN
INTRAMUSCULAR | Status: AC
  Filled 2020-07-25: qty 2

## 2020-07-25 MED ORDER — PROPOFOL 500 MG/50ML IV EMUL
INTRAVENOUS | Status: AC
  Filled 2020-07-25: qty 50

## 2020-07-25 MED ORDER — NEOMYCIN-POLYMYXIN B GU 40-200000 IR SOLN
Status: AC
  Filled 2020-07-25: qty 20

## 2020-07-25 MED ORDER — LIDOCAINE HCL URETHRAL/MUCOSAL 2 % EX GEL
CUTANEOUS | Status: AC
  Filled 2020-07-25: qty 10

## 2020-07-25 MED ORDER — CEFAZOLIN SODIUM-DEXTROSE 2-4 GM/100ML-% IV SOLN
INTRAVENOUS | Status: AC
  Filled 2020-07-25: qty 100

## 2020-07-25 MED ORDER — MAGNESIUM CITRATE PO SOLN
1.0000 | Freq: Once | ORAL | Status: DC | PRN
  Filled 2020-07-25 (×2): qty 296

## 2020-07-25 MED ORDER — EPHEDRINE SULFATE 50 MG/ML IJ SOLN
INTRAMUSCULAR | Status: DC | PRN
  Administered 2020-07-25 (×3): 5 mg via INTRAVENOUS

## 2020-07-25 MED ORDER — IPRATROPIUM-ALBUTEROL 0.5-2.5 (3) MG/3ML IN SOLN: 3.0000 mL | Freq: Once | RESPIRATORY_TRACT | Status: AC

## 2020-07-25 MED ORDER — HYDROCODONE-ACETAMINOPHEN 5-325 MG PO TABS: 1.0000 | ORAL_TABLET | ORAL | Status: DC | PRN

## 2020-07-25 MED ORDER — ONDANSETRON HCL 4 MG PO TABS: 4.0000 mg | ORAL_TABLET | Freq: Four times a day (QID) | ORAL | Status: DC | PRN

## 2020-07-25 MED ORDER — ACETAMINOPHEN 10 MG/ML IV SOLN
INTRAVENOUS | Status: AC
  Filled 2020-07-25: qty 100

## 2020-07-25 MED ORDER — FUROSEMIDE 10 MG/ML IJ SOLN
INTRAMUSCULAR | Status: AC
  Filled 2020-07-25: qty 2

## 2020-07-25 MED ORDER — BISACODYL 10 MG RE SUPP
10.0000 mg | Freq: Every day | RECTAL | Status: DC | PRN
  Administered 2020-07-28: 10 mg via RECTAL
  Filled 2020-07-25: qty 1

## 2020-07-25 MED ORDER — GLYCOPYRROLATE 0.2 MG/ML IJ SOLN
INTRAMUSCULAR | Status: DC | PRN
  Administered 2020-07-25: .2 mg via INTRAVENOUS

## 2020-07-25 MED ORDER — IPRATROPIUM-ALBUTEROL 0.5-2.5 (3) MG/3ML IN SOLN
3.0000 mL | Freq: Once | RESPIRATORY_TRACT | Status: AC
  Administered 2020-07-25: 3 mL via RESPIRATORY_TRACT

## 2020-07-25 MED ORDER — SODIUM CHLORIDE 0.9 % IV SOLN
INTRAVENOUS | Status: DC | PRN
  Administered 2020-07-25: 25 ug/min via INTRAVENOUS

## 2020-07-25 MED ORDER — LACTATED RINGERS IV SOLN: INTRAVENOUS | Status: DC | PRN

## 2020-07-25 MED ORDER — ONDANSETRON HCL 4 MG/2ML IJ SOLN
INTRAMUSCULAR | Status: DC | PRN
  Administered 2020-07-25: 4 mg via INTRAVENOUS

## 2020-07-25 MED ORDER — POLYETHYLENE GLYCOL 3350 17 G PO PACK
17.0000 g | PACK | Freq: Every day | ORAL | Status: DC | PRN
  Administered 2020-07-27 – 2020-07-28 (×2): 17 g via ORAL
  Filled 2020-07-25 (×2): qty 1

## 2020-07-25 MED ORDER — NEOMYCIN-POLYMYXIN B GU 40-200000 IR SOLN
Status: DC | PRN
  Administered 2020-07-25: 14 mL

## 2020-07-25 MED ORDER — ALUM & MAG HYDROXIDE-SIMETH 200-200-20 MG/5ML PO SUSP: 30.0000 mL | ORAL | Status: DC | PRN

## 2020-07-25 MED ORDER — VASOPRESSIN 20 UNIT/ML IV SOLN
INTRAVENOUS | Status: DC | PRN
  Administered 2020-07-25: 2 [IU] via INTRAVENOUS

## 2020-07-25 MED ORDER — ONDANSETRON HCL 4 MG/2ML IJ SOLN
4.0000 mg | Freq: Four times a day (QID) | INTRAMUSCULAR | Status: DC | PRN
  Administered 2020-07-28: 4 mg via INTRAVENOUS
  Filled 2020-07-25: qty 2

## 2020-07-25 MED ORDER — HYDROCODONE-ACETAMINOPHEN 7.5-325 MG PO TABS
1.0000 | ORAL_TABLET | ORAL | Status: DC | PRN
  Administered 2020-07-28: 2 via ORAL
  Administered 2020-07-29: 1 via ORAL
  Filled 2020-07-25: qty 1
  Filled 2020-07-25: qty 2

## 2020-07-25 MED ORDER — DOCUSATE SODIUM 100 MG PO CAPS
100.0000 mg | ORAL_CAPSULE | Freq: Two times a day (BID) | ORAL | Status: DC
  Administered 2020-07-25 – 2020-07-30 (×9): 100 mg via ORAL
  Filled 2020-07-25 (×10): qty 1

## 2020-07-25 SURGICAL SUPPLY — 68 items
BLADE SAGITTAL 25.0X1.19X90 (BLADE) IMPLANT
BLADE SAGITTAL WIDE XTHICK NO (BLADE) ×2 IMPLANT
BLADE SURG SZ10 CARB STEEL (BLADE) ×2 IMPLANT
BNDG COHESIVE 4X5 TAN STRL (GAUZE/BANDAGES/DRESSINGS) ×2 IMPLANT
BOWL CEMENT MIXING ADV NOZZLE (MISCELLANEOUS) ×1 IMPLANT
CATH COUDE FOLEY 2W 5CC 20FR (CATHETERS) ×1 IMPLANT
CEMENT BONE 40GM (Cement) ×2 IMPLANT
CEMENT RESTRICTOR DEPUY SZ 4 (Cement) ×1 IMPLANT
COVER BACK TABLE REUSABLE LG (DRAPES) ×2 IMPLANT
COVER WAND RF STERILE (DRAPES) ×2 IMPLANT
DRAPE 3/4 80X56 (DRAPES) ×4 IMPLANT
DRAPE INCISE IOBAN 66X60 STRL (DRAPES) ×2 IMPLANT
DRAPE SPLIT 6X30 W/TAPE (DRAPES) ×4 IMPLANT
DRAPE SURG 17X11 SM STRL (DRAPES) ×2 IMPLANT
DRSG OPSITE POSTOP 4X10 (GAUZE/BANDAGES/DRESSINGS) ×2 IMPLANT
DURAPREP 26ML APPLICATOR (WOUND CARE) ×8 IMPLANT
ELECT BLADE 6.5 EXT (BLADE) ×2 IMPLANT
ELECT CAUTERY BLADE 6.4 (BLADE) ×2 IMPLANT
ELECT REM PT RETURN 9FT ADLT (ELECTROSURGICAL) ×2
ELECTRODE REM PT RTRN 9FT ADLT (ELECTROSURGICAL) ×1 IMPLANT
GAUZE SPONGE 4X4 12PLY STRL (GAUZE/BANDAGES/DRESSINGS) ×2 IMPLANT
GAUZE XEROFORM 1X8 LF (GAUZE/BANDAGES/DRESSINGS) ×4 IMPLANT
GLOVE BIOGEL PI IND STRL 9 (GLOVE) ×1 IMPLANT
GLOVE BIOGEL PI INDICATOR 9 (GLOVE) ×1
GLOVE SURG 9.0 ORTHO LTXF (GLOVE) ×4 IMPLANT
GOWN STRL REUS TWL 2XL XL LVL4 (GOWN DISPOSABLE) ×2 IMPLANT
GOWN STRL REUS W/ TWL LRG LVL3 (GOWN DISPOSABLE) ×1 IMPLANT
GOWN STRL REUS W/TWL LRG LVL3 (GOWN DISPOSABLE) ×2
HEAD MODULAR ENDO (Orthopedic Implant) ×2 IMPLANT
HEAD UNPLR 54XMDLR STRL HIP (Orthopedic Implant) IMPLANT
HEMOVAC 400ML (MISCELLANEOUS) ×2
KIT DRAIN HEMOVAC JP 7FR 400ML (MISCELLANEOUS) ×1 IMPLANT
KIT TURNOVER KIT A (KITS) ×2 IMPLANT
MANIFOLD NEPTUNE II (INSTRUMENTS) ×2 IMPLANT
NDL FILTER BLUNT 18X1 1/2 (NEEDLE) ×1 IMPLANT
NDL MAYO CATGUT SZ4 TPR NDL (NEEDLE) ×1 IMPLANT
NDL SAFETY ECLIPSE 18X1.5 (NEEDLE) ×1 IMPLANT
NEEDLE FILTER BLUNT 18X 1/2SAF (NEEDLE) ×1
NEEDLE FILTER BLUNT 18X1 1/2 (NEEDLE) ×1 IMPLANT
NEEDLE HYPO 18GX1.5 SHARP (NEEDLE) ×2
NEEDLE MAYO CATGUT SZ4 (NEEDLE) ×2 IMPLANT
NS IRRIG 1000ML POUR BTL (IV SOLUTION) ×2 IMPLANT
PACK HIP PROSTHESIS (MISCELLANEOUS) ×2 IMPLANT
PILLOW ABDUCTION FOAM SM (MISCELLANEOUS) ×2 IMPLANT
PRESSURIZER FEM CANAL M (MISCELLANEOUS) ×1 IMPLANT
PULSAVAC PLUS IRRIG FAN TIP (DISPOSABLE) ×2
RETRIEVER SUT HEWSON (MISCELLANEOUS) ×2 IMPLANT
SLEEVE UNITRAX V40 (Orthopedic Implant) ×2 IMPLANT
SLEEVE UNITRAX V40 +4 (Orthopedic Implant) IMPLANT
SOL .9 NS 3000ML IRR  AL (IV SOLUTION) ×2
SOL .9 NS 3000ML IRR AL (IV SOLUTION) ×1
SOL .9 NS 3000ML IRR UROMATIC (IV SOLUTION) ×1 IMPLANT
SPACER OSTEO CEMENT (Orthopedic Implant) ×2 IMPLANT
SPACER OSTEO CEMENT 13HIP (Orthopedic Implant) IMPLANT
STAPLER SKIN PROX 35W (STAPLE) ×2 IMPLANT
STEM HIP ACCOLADE SZ5 37X145 (Stem) ×1 IMPLANT
SUT TICRON 2-0 30IN 311381 (SUTURE) ×8 IMPLANT
SUT VIC AB 0 CT1 36 (SUTURE) ×4 IMPLANT
SUT VIC AB 2-0 CT1 18 (SUTURE) ×1 IMPLANT
SUT VIC AB 2-0 CT2 27 (SUTURE) ×4 IMPLANT
SYR 10ML LL (SYRINGE) ×2 IMPLANT
TAPE MICROFOAM 4IN (TAPE) ×2 IMPLANT
TAPE TRANSPORE STRL 2 31045 (GAUZE/BANDAGES/DRESSINGS) ×2 IMPLANT
TIP BRUSH PULSAVAC PLUS 24.33 (MISCELLANEOUS) ×2 IMPLANT
TIP FAN IRRIG PULSAVAC PLUS (DISPOSABLE) ×1 IMPLANT
TOWER CARTRIDGE SMART MIX (DISPOSABLE) ×1 IMPLANT
TRAY FOLEY MTR SLVR 16FR STAT (SET/KITS/TRAYS/PACK) ×2 IMPLANT
TUBE SUCT KAM VAC (TUBING) IMPLANT

## 2020-07-25 NOTE — Op Note (Signed)
07/25/2020  5:16 PM  PATIENT:  [name] [name]   MRN: [id number]  PRE-OPERATIVE DIAGNOSIS:  Right Displaced Femoral Neck Hip Fracture with metastatic lesion in the proximal femoral metaphysis  POST-OPERATIVE DIAGNOSIS:  Right Hip Fracture  PROCEDURE:  Procedure(s): RIGHT HIP HEMIARTHROPLASTY  SURGICAL BIOPSY OF PROXIMAL FEMORAL LESION  PREOPERATIVE INDICATIONS:    Linville T Trahan is an 88 y.o. male with a history of metastatic lung cancer who was admitted on 07/24/19 and diagnosed with a Right Femoral Neck Hip Fracture by xray at [facility] s/p fall at home.  MRI of the right hip was ordered and the patient was determined to have a proximal femoral metaphyseal lesion.  After discussion with the Orthopaedic Oncologist at [facility], Dr. Eward, the decision was made to perform a cemented hemiarthroplasty. I have explained the details of surgery and the postoperative course to the patient and their son who have agreed with surgical management of this fracture.    The risks benefits and alternatives were discussed with the patient and their family including but not limited to the risks of  infection requiring removal of the prosthesis, bleeding requiring blood transfusion, nerve injury especially to the sciatic nerve leading to foot drop or lower extremity numbness, periprosthetic fracture, dislocation leg length discrepancy, change in lower extremity rotation persistent hip pain, loosening or failure of the components and the need for revision surgery. Medical risks include but are not limited to DVT and pulmonary embolism, myocardial infarction, stroke, pneumonia, respiratory failure and death.    SURGEON:   , MD    ASSISTANT:  RN 1st assist, Adrian    ANESTHESIA:  Spinal  EBL:  200 cc    COMPLICATIONS:  None.   SPECIMEN: Femoral head to pathology,     COMPONENTS:  Stryker Accolade C femoral component size 5 with a 54 mm Femoral head component +4 neck adjustment sleeve.    PROCEDURE IN DETAIL:    The patient was met in the holding area and  identified.  The appropriate hip was identified and marked at the operative site after verbally confirming with the patient that this was the correct site of surgery.  Patient has been off his Eliquis since Monday.  The patient was then transported to the OR  and  underwent spinal anesthesia.  The patient was then placed in the lateral decubitus position with the operative side up and secured on the operating room table with a pegboard and all bony prominences were adequately padded. This included an axillary roll and additional padding around the nonoperative leg to prevent compression to the common peroneal nerve.    The operative lower extremity was prepped and draped in a sterile fashion.  A time out was performed prior to incision to verify patient's name, date of birth, medical record number, correct site of surgery correct procedure to be performed. The timeout was also used to verify the patient received antibiotics now appropriate instruments, implants and radiographic studies were available in the room. Once all in attendance were in agreement case began.    A posterolateral approach was utilized via sharp dissection  carried down to the subcutaneous tissue.  Bleeding vessels were coagulated using electrocautery.  The fascia lata was identified and incised along the length of the skin incision.  The gluteus maximus muscle was then split in line with its fibers. Self-retaining retractors were  inserted.  With the hip internally rotated, the short external rotators  were identified and removed from the posterior attachment from the   greater trochanter. The piriformis was tagged for later repair. The capsule was identified and a T-shaped capsulotomy was performed. The capsule was tagged with #2 Tycron for later repair.  The femoral neck fracture was exposed, and the femoral head was removed using a corkscrew device. This was measured to be 54 mm in  diameter. The attention was then turned to proximal femur preparation.  An oscillating saw was used to perform a proximal femoral osteotomy 1 fingerbreadth above the lesser trochanter. The trial 54 mm femoral head was placed into the acetabulum and had an excellent suction fit. The attention was then turned back to femoral preparation.    A femoral skid and Cobra retractor were placed under the femoral neck to allow for adequate visualization. A box osteotome was used to make the initial entry into the proximal femur. A single hand reamer was used to prepare the femoral canal. A T-shaped femoral canal sounder was then used to ensure no penetration femoral cortex had occurred during reaming. The proximal femur was then sequentially broached by hand. A size 5 femoral trial broach was found to have best medial to lateral canal fit. Once adequate mediolateral canal fill was achieved the trial femoral broach, neck, and head was assembled and the hip was reduced. It was found to have excellent stability, equivalent leg lengths with functional range of motion. The trial components were then removed.  A curved curette was used to take intramedullary bone samples as biopsy of the metaphyseal lesion seen on MRI.  The femoral head was also sent to pathology for analysis.    I copiously irrigated the femoral canal with an intramedullary pulse lavage.  The femoral canal was then sized for a cement restrictor and was found to be a size 4.   The actual cement restrictor was then inserted into the femoral canal to a depth longer that the actual femoral stem prosthesis as measured on the back table.  The canal was filled with methyl methacrylate and pressurized and then the actual femoral prosthesis was inserted into place with a centralizer in approximately 15 degrees of anteversion and manually held in position until the methylmethacrylate set.  The 54 trial head with a +4 head adjustment sleeve was trialled again and had an  excellent fit and had equivalent leg lengths.  The actual Unitrax femoral component with a +4 neck adjustment sleeve was malleted onto the trunion. The hip was then reduced and taken through functional range of motion and found to have excellent stability. Leg lengths were restored. The hip joint was copiously irrigated.   A soft tissue repair of the capsule and external rotators was performed using #2 Tycron Excellent posterior capsular repair was achieved. The fascia lata was then closed with interrupted 0 Vicryl suture. The subcutaneous tissues were closed with 2-0 Vicryl and the skin approximated with staples.   The patient was then placed supine on the operative table. Leg lengths were checked clinically and found to be equivalent. An abduction pillow was placed between the lower extremities.   Two attempts to place a foley catheter at the start of the surgery by the RNs was unsuccessful.   Therefore Dr. Al Pimple from urology can into the OR at the conclusion of the case to place a 45F foley.   The patient has a history of BPH treated by Dr. Erlene Quan in his office.   Patient will keep the foley in place for 7 days.  The patient was then transferred to a hospital bed and  brought to the PACU in stable condition. I was scrubbed and present the entire case and all sharp and instrument counts were correct at the conclusion of the case. I spoke with the patient's son by phone postop to let him know the case was completed without complication and the patient was stable in recovery room.    L , MD Orthopedic Surgeon  

## 2020-07-25 NOTE — Anesthesia Procedure Notes (Signed)
Procedure Name: General with mask airway Performed by: Fletcher-Harrison, Mattheo Swindle, CRNA Pre-anesthesia Checklist: Patient identified, Emergency Drugs available, Suction available and Patient being monitored Patient Re-evaluated:Patient Re-evaluated prior to induction Oxygen Delivery Method: Simple face mask Induction Type: IV induction Placement Confirmation: positive ETCO2 and CO2 detector Dental Injury: Teeth and Oropharynx as per pre-operative assessment        

## 2020-07-25 NOTE — Progress Notes (Signed)
Patient ID: [name] [name], male   DOB: 1931-11-28, 87 y.o.   MRN: [id number] [facility] PROGRESS NOTE  [name] [name] AOZ:[id number] DOB: 15-Dec-1931 DOA: 07/23/2020 PCP: Dion Body, MD  HPI/Subjective: Patient seen this morning and was feeling okay.  No complaints of chest pain or shortness of breath.  Having a little pain in his hip.  Came in after his right knee gave out and found to have a hip fracture.  Objective: Vitals:   07/25/20 1130 07/25/20 1224  BP: (!) 146/81 (!) 146/89  Pulse: 97 89  Resp: 18 14  Temp: 98.2 F (36.8 C) 98.5 F (36.9 C)  SpO2: 96% 92%    Intake/Output Summary (Last 24 hours) at 07/25/2020 1433 Last data filed at 07/25/2020 0007 Gross per 24 hour  Intake 51.05 ml  Output 300 ml  Net -248.95 ml   Filed Weights   07/23/20 0623  Weight: 97.5 kg    ROS: Review of Systems  Respiratory: Negative for shortness of breath.   Cardiovascular: Negative for chest pain.  Gastrointestinal: Negative for abdominal pain, nausea and vomiting.   Exam: Physical Exam HENT:     Head: Normocephalic.     Mouth/Throat:     Pharynx: No oropharyngeal exudate.  Eyes:     General: Lids are normal.     Conjunctiva/sclera: Conjunctivae normal.     Pupils: Pupils are equal, round, and reactive to light.  Cardiovascular:     Rate and Rhythm: Normal rate and regular rhythm.     Heart sounds: Normal heart sounds, S1 normal and S2 normal.  Pulmonary:     Breath sounds: Normal breath sounds. No decreased breath sounds, wheezing, rhonchi or rales.  Abdominal:     Palpations: Abdomen is soft.     Tenderness: There is no abdominal tenderness.  Musculoskeletal:     Right lower leg: No swelling.     Left lower leg: No swelling.  Skin:    General: Skin is warm.     Findings: No rash.  Neurological:     Mental Status: He is alert and oriented to person, place, and time.       Data Reviewed: Basic Metabolic Panel: Recent Labs  Lab 07/23/20 0631 07/24/20 0322  07/25/20 0322  NA 137 138 137  K 3.5 3.6 3.3*  CL 103 102 98  CO2 23 28 30   GLUCOSE 159* 124* 99  BUN 12 14 18   CREATININE 0.58* 0.58* 0.62  CALCIUM 8.4* 8.5* 8.0*   Liver Function Tests: Recent Labs  Lab 07/23/20 0631  AST 15  ALT 12  ALKPHOS 88  BILITOT 0.7  PROT 6.8  ALBUMIN 2.9*   CBC: Recent Labs  Lab 07/23/20 0631 07/24/20 0322 07/25/20 0322  WBC 9.5 12.1* 9.6  NEUTROABS 8.0*  --   --   HGB 12.1* 11.7* 11.9*  HCT 38.5* 37.2* 36.5*  MCV 77.9* 78.8* 77.3*  PLT 304 326 293   Cardiac Enzymes: Recent Labs  Lab 07/23/20 0631  CKTOTAL 50   BNP (last 3 results) Recent Labs    07/23/20 0631  BNP 71.8     Recent Results (from the past 240 hour(s))  Resp Panel by RT-PCR (Flu A&B, Covid) Nasopharyngeal Swab     Status: None   Collection Time: 07/23/20  6:50 AM   Specimen: Nasopharyngeal Swab; Nasopharyngeal(NP) swabs in vial transport medium  Result Value Ref Range Status   SARS Coronavirus 2 by RT PCR NEGATIVE NEGATIVE Final    Comment: (NOTE)  SARS-CoV-2 target nucleic acids are NOT DETECTED.  The SARS-CoV-2 RNA is generally detectable in upper respiratory specimens during the acute phase of infection. The lowest concentration of SARS-CoV-2 viral copies this assay can detect is 138 copies/mL. A negative result does not preclude SARS-Cov-2 infection and should not be used as the sole basis for treatment or other patient management decisions. A negative result may occur with  improper specimen collection/handling, submission of specimen other than nasopharyngeal swab, presence of viral mutation(s) within the areas targeted by this assay, and inadequate number of viral copies(<138 copies/mL). A negative result must be combined with clinical observations, patient history, and epidemiological information. The expected result is Negative.  Fact Sheet for Patients:  EntrepreneurPulse.com.au  Fact Sheet for Healthcare Providers:   IncredibleEmployment.be  This test is no t yet approved or cleared by the [place] FDA and  has been authorized for detection and/or diagnosis of SARS-CoV-2 by FDA under an Emergency Use Authorization (EUA). This EUA will remain  in effect (meaning this test can be used) for the duration of the COVID-19 declaration under Section 564(b)(1) of the Act, 21 U.S.C.section 360bbb-3(b)(1), unless the authorization is terminated  or revoked sooner.       Influenza A by PCR NEGATIVE NEGATIVE Final   Influenza B by PCR NEGATIVE NEGATIVE Final    Comment: (NOTE) The Xpert Xpress SARS-CoV-2/FLU/RSV plus assay is intended as an aid in the diagnosis of influenza from Nasopharyngeal swab specimens and should not be used as a sole basis for treatment. Nasal washings and aspirates are unacceptable for Xpert Xpress SARS-CoV-2/FLU/RSV testing.  Fact Sheet for Patients: EntrepreneurPulse.com.au  Fact Sheet for Healthcare Providers: IncredibleEmployment.be  This test is not yet approved or cleared by the [place] FDA and has been authorized for detection and/or diagnosis of SARS-CoV-2 by FDA under an Emergency Use Authorization (EUA). This EUA will remain in effect (meaning this test can be used) for the duration of the COVID-19 declaration under Section 564(b)(1) of the Act, 21 U.S.C. section 360bbb-3(b)(1), unless the authorization is terminated or revoked.  Performed at [facility], [address], [place], [place] [address]   Surgical PCR screen     Status: None   Collection Time: 07/24/20 11:12 AM   Specimen: Nasal Mucosa; Nasal Swab  Result Value Ref Range Status   MRSA, PCR NEGATIVE NEGATIVE Final   Staphylococcus aureus NEGATIVE NEGATIVE Final    Comment: (NOTE) The Xpert SA Assay (FDA approved for NASAL specimens in patients 79 years of age and older), is one component of a comprehensive surveillance  program. It is not intended to diagnose infection nor to guide or monitor treatment. Performed at [facility], [address], [place], [place] [address]       Scheduled Meds: . [MAR Hold] amLODipine  5 mg Oral q morning - 10a  . [MAR Hold] atorvastatin  10 mg Oral Daily  . [MAR Hold] feeding supplement  237 mL Oral TID BM  . [MAR Hold] ipratropium-albuterol  3 mL Nebulization Q6H  . [MAR Hold] lisinopril  40 mg Oral q morning - 10a  . [MAR Hold] metoprolol tartrate  50 mg Oral BID  . [MAR Hold] multivitamin with minerals  1 tablet Oral Daily  . [MAR Hold] zinc sulfate  220 mg Oral Daily   Continuous Infusions:  Assessment/Plan:  1. Closed right femoral neck fracture requiring operative repair.  Holding Eliquis at this time.  Patient in the OR currently. 2. Nausea vomiting.  Likely  secondary to pain medications.  As needed Zofran.  No complaints of this this morning. 3. Previous mural thrombus of the heart on IV heparin will go back on Eliquis postoperatively once stable 4. History of stroke on Eliquis 5. Hyperlipidemia unspecified.  Continue Lipitor 6. History of CAD.  On Lipitor and metoprolol. 7. History of renal cell cancer and melanoma.  Patient likely has metastases to his lungs and did receive treatment for this in the past with radiation.  Looking back at CT scan back in 2019 did see the area in the right upper lobe that we are seeing on the x-ray currently. 8. Wheeze has resolved. 9. Right knee gave out prior to falling.  We will see how he does with physical therapy after hip surgery.      Code Status:     Code Status Orders  (From admission, onward)         Start     Ordered   07/23/20 1436  Do not attempt resuscitation (DNR)  Continuous        07/23/20 1435        Code Status History    Date Active Date Inactive Code Status Order ID Comments User Context   07/23/2020 1141 07/23/2020 1435 Full Code [id number]  Ivor Costa, MD ED   07/23/2020 0901  07/23/2020 1141 DNR [id number]  Ivor Costa, MD ED   Advance Care Planning Activity     Family Communication: Spoke with son at the bedside Disposition Plan: Status is: Inpatient  Dispo: The patient is from: Home              Anticipated d/c is to: Rehab              Anticipated d/c date is: Earliest potential 07/28/2020 but likely 07/29/2020              Patient currently requiring operative repair today for right hip fracture  Time spent: 28 minutes  [place]

## 2020-07-25 NOTE — Progress Notes (Signed)
[facility] Encounter Note  Patient: [name] [name] / Admit Date: 07/23/2020 / Date of Encounter: 07/25/2020, 7:22 AM   Subjective: No particular change in patient's condition overnight.  Patient has not had any congestive heart failure chest discomfort or anginal equivalent at this time.  Oxygenation seems to be reasonable at this time.  No evidence of rhythm disturbances or other cardiovascular concerns.  Patient was awaiting washout of Eliquis before surgical intervention.  Patient still is at the lowest risk possible for cardiovascular complication with surgical intervention and or rehabilitation..  Review of Systems: Positive for: Leg pain Negative for: Vision change, hearing change, syncope, dizziness, nausea, vomiting,diarrhea, bloody stool, stomach pain, cough, congestion, diaphoresis, urinary frequency, urinary pain,skin lesions, skin rashes Others previously listed  Objective: Telemetry: Normal sinus rhythm Physical Exam: Blood pressure (!) 153/71, pulse 96, temperature 99.1 F (37.3 C), temperature source Oral, resp. rate 16, height 5\' 10"  (1.778 m), weight 97.5 kg, SpO2 92 %. Body mass index is 30.85 kg/m. General: Well developed, well nourished, in no acute distress. Head: Normocephalic, atraumatic, sclera non-icteric, no xanthomas, nares are without discharge. Neck: No apparent masses Lungs: Normal respirations with no wheezes, some rhonchi, no rales , right upper lobe crackles   Heart: Regular rate and rhythm, normal S1 S2, no murmur, no rub, no gallop, PMI is normal size and placement, carotid upstroke normal without bruit, jugular venous pressure normal Abdomen: Soft, non-tender, non-distended with normoactive bowel sounds. No hepatosplenomegaly. Abdominal aorta is normal size without bruit Extremities: Trace edema, no clubbing, no cyanosis, no ulcers,  Peripheral: 2+ radial, 2+ femoral, 2+ dorsal pedal pulses Neuro: Alert and oriented. Moves all  extremities spontaneously. Psych:  Responds to questions appropriately with a normal affect.   Intake/Output Summary (Last 24 hours) at 07/25/2020 7106 Last data filed at 07/25/2020 0007 Gross per 24 hour  Intake 412.92 ml  Output 800 ml  Net -387.08 ml    Inpatient Medications:  . amLODipine  5 mg Oral q morning - 10a  . atorvastatin  10 mg Oral Daily  . feeding supplement  237 mL Oral TID BM  . ipratropium-albuterol  3 mL Nebulization Q6H  . lisinopril  40 mg Oral q morning - 10a  . metoprolol tartrate  50 mg Oral BID  . multivitamin with minerals  1 tablet Oral Daily  . zinc sulfate  220 mg Oral Daily   Infusions:    Labs: Recent Labs    07/24/20 0322 07/25/20 0322  NA 138 137  K 3.6 3.3*  CL 102 98  CO2 28 30  GLUCOSE 124* 99  BUN 14 18  CREATININE 0.58* 0.62  CALCIUM 8.5* 8.0*   Recent Labs    07/23/20 0631  AST 15  ALT 12  ALKPHOS 88  BILITOT 0.7  PROT 6.8  ALBUMIN 2.9*   Recent Labs    07/23/20 0631 07/24/20 0322 07/25/20 0322  WBC 9.5 12.1* 9.6  NEUTROABS 8.0*  --   --   HGB 12.1* 11.7* 11.9*  HCT 38.5* 37.2* 36.5*  MCV 77.9* 78.8* 77.3*  PLT 304 326 293   Recent Labs    07/23/20 0631  CKTOTAL 50   Invalid input(s): POCBNP No results for input(s): HGBA1C in the last 72 hours.   Weights: Filed Weights   07/23/20 2694  Weight: 97.5 kg     Radiology/Studies:  CT HEAD WO CONTRAST  Result Date: 07/23/2020 CLINICAL DATA:  Tripped and fell while walking to the bathroom this morning,  struck back of head history of metastatic renal cell carcinoma EXAM: CT HEAD WITHOUT CONTRAST TECHNIQUE: Contiguous axial images were obtained from the base of the skull through the vertex without intravenous contrast. Sagittal and coronal MPR images reconstructed from axial data set. COMPARISON:  07/09/2014 FINDINGS: Brain: Generalized atrophy. Normal ventricular morphology. No midline shift or mass effect. Small vessel chronic ischemic changes of deep cerebral  white matter. No intracranial hemorrhage, mass lesion, or evidence of acute infarction. No extra-axial fluid collections. Vascular: Atherosclerotic calcifications of internal carotid and vertebral arteries at skull base Skull: Demineralized but intact Sinuses/Orbits: Mucosal thickening in a few ethmoid air cells. Remaining visualized paranasal sinuses and mastoid air cells clear Other: N/A IMPRESSION: Atrophy with small vessel chronic ischemic changes of deep cerebral white matter. No acute intracranial abnormalities. Electronically Signed   By: Lavonia Dana M.D.   On: 07/23/2020 09:14   MR HIP RIGHT W WO CONTRAST  Result Date: 07/23/2020 CLINICAL DATA:  Fall with right hip fracture. History of renal cell carcinoma. EXAM: MRI OF THE RIGHT HIP WITHOUT AND WITH CONTRAST TECHNIQUE: Multiplanar, multisequence MR imaging was performed both before and after administration of intravenous contrast. CONTRAST:  7.24mL GADAVIST GADOBUTROL 1 MMOL/ML IV SOLN COMPARISON:  X-ray 07/23/2020 FINDINGS: Bones: Acute fracture of the right femoral neck with slight impaction and external rotation. Fracture line does not involve the intertrochanteric aspect of the femur or the humeral head articular surface. No additional fractures. No femoral head avascular necrosis. Destructive marrow replacing lesion centered within the lateral aspect of the medullary space of the intertrochanteric right femur measuring approximately 3.5 x 3.2 x 3.3 cm (series 4, image 21; series 6, image 16). There is cortical breakthrough along the lateral cortex with a contiguous enhancing extraosseous soft tissue mass which measures approximately 2.2 x 1.0 x 1.7 cm (series 6, image 14; series 8, image 20). There is marked bone marrow edema surrounding the mass. Mass occupies nearly 50% of the cross-sectional area of the femur. No complete pathologic fracture of the femur. Additional rounded 8 mm T1 hypointense, T2 hyperintense lesion within the posterior left  iliac (series 3, image 11). Additional subcentimeter T2 hyperintense focus within the L4 vertebral body is nonspecific (series 3, image 20). Articular cartilage and labrum Articular cartilage: Mild diffuse chondral thinning and surface irregularity. Labrum:  Degenerated appearance of the superior labrum. Joint or bursal effusion Joint effusion:  No significant joint effusion. Bursae: No bursal fluid collections. Muscles and tendons Muscles and tendons: Intramuscular edema within the right gluteus minimus muscle. Prominent intramuscular edema and patchy enhancement within the proximal vastus lateralis and vastus intermediate muscles adjacent to the bone lesion. No acute tendinous injury. Other findings Miscellaneous: Prostate gland is enlarged with a heterogeneous, nodular configuration resulting in mass effect upon the base of the urinary bladder. Exophytic cyst emanating from the inferior pole of the left kidney measures approximately 2.1 cm. IMPRESSION: 1. Acute fracture of the right femoral neck with slight impaction and rotation. 2. Marrow replacing lesion within the intertrochanteric aspect of the proximal right femur measuring up to 3.5 cm. Cortical breakthrough along the lateral cortex with associated extra-osseous soft tissue mass. Surrounding bone marrow edema without pathologic fracture. Findings are highly suspicious for a metastatic lesion in this patient with known malignancy. 3. Intramuscular edema and enhancement within the proximal aspect of the vastus lateralis and vastus intermedius muscles adjacent to the proximal femoral mass, which may be reactive or reflect tumor infiltration. 4. Additional subcentimeter lesions within the posterior left  iliac bone and L4 vertebral body, which may represent additional metastatic lesions. 5. Enlarged, heterogeneous, nodular configuration of the prostate gland resulting in mass effect upon the base of the urinary bladder. Correlate with serum PSA. These results  will be called to the ordering clinician or representative by the Radiologist Assistant, and communication documented in the PACS or [employer]. Electronically Signed   By: Davina Poke D.O.   On: 07/23/2020 10:51   DG Chest Port 1 View  Result Date: 07/23/2020 CLINICAL DATA:  Fall. EXAM: PORTABLE CHEST 1 VIEW COMPARISON:  CT 09/08/2017.  Chest x-ray 07/09/2014. FINDINGS: Mediastinum and hilar structures normal. Cardiomegaly. No pulmonary venous congestion. Prominent right upper lung infiltrate versus mass noted. Atelectatic changes left mid lung and left base. Tiny left pleural effusion cannot be excluded. No pneumothorax. Degenerative change scratched it diffuse osteopenia. Degenerative changes and scoliosis cervicothoracic spine. Left posterolateral seventh rib fracture cannot be completely excluded. IMPRESSION: 1. Prominent right upper lung infiltrate versus mass noted. Lung cancer cannot be excluded. Close follow-up chest x-rays to demonstrate clearing suggested. 2. Atelectatic changes left mid lung and left base. Tiny left pleural effusion cannot be excluded. 3. Cardiomegaly.  No pulmonary venous congestion. 4. Left posterolateral seventh rib fracture cannot be completely excluded. No pneumothorax. Electronically Signed   By: Marcello Moores  Register   On: 07/23/2020 07:16   DG Hip Unilat With Pelvis 2-3 Views Right  Result Date: 07/23/2020 CLINICAL DATA:  Right hip pain after fall. EXAM: DG HIP (WITH OR WITHOUT PELVIS) 2-3V RIGHT COMPARISON:  CT 08/25/2017. FINDINGS: Slightly displaced and angulated right femoral neck fracture. Diffuse osteopenia. Degenerative changes lumbar spine and both hips. Pelvic calcifications consistent phleboliths. Peripheral vascular calcification. IMPRESSION: 1.  Slightly displaced and angulated right femoral neck fracture. 2.  Peripheral vascular disease. Electronically Signed   By: Marcello Moores  Register   On: 07/23/2020 07:11     Assessment and Recommendation  87 y.o. male  with known peripheral vascular disease status post stroke with right residual hemiparesis hypertension hyperlipidemia on a previous appropriate medication management likely with a mechanical fall due to multiple issues listed above and no current evidence of acute coronary syndrome rhythm disturbances myocardial infarction angina or congestive heart failure currently at lowest risk possible for cardiovascular complication with hip surgical repair 1.  No further cardiac diagnostics necessary at this time 2.  Proceed to hip surgery and rehabilitation without restriction 3.  Reinstatement of previous medication management for hypertension control and including metoprolol amlodipine lisinopril as before without change after surgery 4.  High intensity cholesterol therapy for previous peripheral vascular disease 5.  Reinstatement of anticoagulation medication management for previous stroke when appropriate post surgery 6.  No restrictions to rehabilitation 7.  Call if further questions perioperatively if needed otherwise will sign off at this time  Signed, Serafina Royals M.D. FACC

## 2020-07-25 NOTE — Progress Notes (Signed)
[place] [facility] [place] [facility] patient RN note:  [name] [name] is a current hospice patient with [facility] with a terminal diagnosis of lung cancer. He is independent and lives alone but reports that on 01.04,  he got up to go to the bathroom,  tripped and fell in the morning. He did not lose consciousness . He thinks he might have hit his head but he had no headache and no neck pain.  He developed severe pain in the right hip, which was sharp, constant, severe, nonradiating, aggravated by movement. He had no numbness nor tingling in his legs. He was brought to the Emergency Room by EMS. Son, [name] [name] reports that he forgot to contact the Hospice agency prior to [facility] Liaison visit on 01.06. Patient was admitted to [facility] on 01.04.22 with a diagnosis of right femoral neck fracture. Per Dr. Gilford Rile with [facility], this is a related hospital admission. Patient is a DNR.  Visited patient and son at bedside. He was alert and oriented and resting in bed comfortably although he does have some hip pain rated at 2 for which he has been receiving IV Morphine and Percocet. He reports that he is going to have surgery at 1 pm. Discussion with patient and son regarding plans after discharge and they both agree that patient may need some rehab after surgery before he could return home. Discussed that Hospice would need to be revoked for medicare to cover rehab and then could be restarted after. Son and patient understand and have no questions.  Vital Signs: BP 146/81, HR 97, Resp 18, Temp 98.5, O2 sat 92% on RA  I&O: 467ml / 856ml  Abnormal labs:  Potassium: 3.3 (L) Calcium: 8.0 (L) Hemoglobin: 11.9 (L) HCT: 36.5 (L) MCV: 77.3 (L) MCH: 25.2 (L) Heparin Unfractionated: 0.19 (L) APTT: 39 (H)  Diagnostics: MR HIP IMPRESSION: 1. Acute fracture of the right femoral neck with slight impaction and rotation. 2. Marrow replacing lesion within the intertrochanteric  aspect of the proximal right femur measuring up to 3.5 cm. Cortical breakthrough along the lateral cortex with associated extra-osseous soft tissue mass. Surrounding bone marrow edema without pathologic fracture. Findings are highly suspicious for a metastatic lesion in this patient with known malignancy. 3. Intramuscular edema and enhancement within the proximal aspect of the vastus lateralis and vastus intermedius muscles adjacent to the proximal femoral mass, which may be reactive or reflect tumor infiltration. 4. Additional subcentimeter lesions within the posterior left iliac bone and L4 vertebral body, which may represent additional metastatic lesions. 5. Enlarged, heterogeneous, nodular configuration of the prostate gland resulting in mass effect upon the base of the urinary bladder. Correlate with serum PSA.  CXR: IMPRESSION: 1. Prominent right upper lung infiltrate versus mass noted. Lung cancer cannot be excluded. Close follow-up chest x-rays to demonstrate clearing suggested. 2. Atelectatic changes left mid lung and left base. Tiny left pleural effusion cannot be excluded. 3. Cardiomegaly.  No pulmonary venous congestion. 4. Left posterolateral seventh rib fracture cannot be completely excluded. No pneumothorax.   IV/PRN Meds: ceFAZolin (ANCEF) IVPB 2g/100 mL premix Dose: 2 g Freq: 30 min pre-op Route: IV  heparin ADULT infusion 100 units/mL (25000 units/232mL) Rate: 17 mL/hr Dose: 1700 Units/hr Freq: Continuous Route: IV  morphine 2 MG/ML injection 1 mg Dose: 1 mg Freq: Every 3 hours PRN Route: IV  ondansetron (ZOFRAN) injection 4 mg Dose: 4 mg Freq: Once PRN Route: IV  hydrALAZINE (APRESOLINE) injection 5 mg Dose: 5 mg Freq:  Every 2 hours PRN Route: IV  Problem list: Assessment/Plan:  1. Closed right femoral neck fracture requiring operative repair.  Continue to hold Eliquis.  Case discussed with Dr. Mack Guise orthopedic surgery and they will delay  surgery until tomorrow for Eliquis to get out of his system. 2. Nausea and vomiting.  Could be secondary to pain medications.  I ordered as needed Zofran. 3. Previous mural thrombus of the heart.  On IV heparin.  Will go back to Eliquis postoperatively once stable. 4. History of stroke on Eliquis 5. Hyperlipidemia unspecified on Lipitor 6. History of CAD on Lipitor and metoprolol. 7. Right knee giving out on him and that is one of the reasons why he fell.  Orthopedic following. 8. History of renal cancer and melanoma.  X-ray also seeing area on his lung.  Looking back in 2019 also did see area in his right upper lobe on CT scan 9. Wheeze this morning.  Given nebulizer treatments and 1 dose of Lasix.  Discharge Planning: Ongoing, discussion has been had regarding revocation of hospice to pursue rehab post surgery.  Family contact: Spoke with son, [name] [name] in room. No concerns.  IDG: Updated  Goals of Care: Clear  Medication list and Transfer Summary placed on Shadow Chart.  Please call with any hospice related questions or concerns.  Thank you.  Zandra Abts, RN [facility] Liaison [phone]

## 2020-07-25 NOTE — Transfer of Care (Signed)
Immediate Anesthesia Transfer of Care Note  Patient: [name] [name]  Procedure(s) Performed: Procedure(s): ARTHROPLASTY BIPOLAR HIP (HEMIARTHROPLASTY) (Right)  Patient Location: PACU  Anesthesia Type:General  Level of Consciousness: sedated  Airway & Oxygen Therapy: Patient Spontanous Breathing and Patient connected to face mask oxygen  Post-op Assessment: Report given to RN and Post -op Vital signs reviewed and stable  Post vital signs: Reviewed and stable  Last Vitals:  Vitals:   07/25/20 1224 07/25/20 1703  BP: (!) 146/89 (!) 95/58  Pulse: 89 76  Resp: 14 (!) 21  Temp: 36.9 C 36.6 C  SpO2: 76% 72%    Complications: No apparent anesthesia complications

## 2020-07-25 NOTE — OR Nursing (Signed)
16FR FOLEY CATHETER INSERTED WITH NO URINE RETURN X 2, BLOOD NOTED WITH REMOVAL OF 2ND ATTEMPT. DR. Mack Guise PRESENT AT TIME. NO FOLEY PRESENT DURING SURGERY. UROLOGIST TO PLACE FOLEY AFTER PROCEDURE PER DR. Mack Guise.

## 2020-07-25 NOTE — Progress Notes (Signed)
Pt currently on 2 L [place]. Disoriented to city.  Remains somewhat agitated at times.  Dr Mack Guise and weiting aware.  Per dr Andree Elk ok to return to floor.

## 2020-07-25 NOTE — OR Nursing (Signed)
DR. Diamantina Providence PRERSENT FOR FOLEY INSERTION AT THIS TIME.

## 2020-07-25 NOTE — Progress Notes (Signed)
Pt sats decreased.  was on 2 L [place] and remain 88-91 on the 2 L. Coarse expiratory wheezing bilateral.  Dr Andree Elk notified and duoneb ordered.    1739- called and informed dr Andree Elk pt is somewhat combative at this time.  Has been intermittently confused since coming out of surgery.  sats 91 on 6 L during neb.  Portable CXR ordered and xray tech at bedside.  Pt unlabored but mildly pursed lips on expiration, no prolonged expiration.

## 2020-07-25 NOTE — Anesthesia Preprocedure Evaluation (Addendum)
Anesthesia Evaluation  Patient identified by MRN, date of birth, ID band Patient awake    Reviewed: Allergy & Precautions, H&P , NPO status , Patient's Chart, lab work & pertinent test results  History of Anesthesia Complications Negative for: history of anesthetic complications  Airway Mallampati: II  TM Distance: >3 FB     Dental  (+) Chipped   Pulmonary neg sleep apnea, neg COPD, former smoker,    breath sounds clear to auscultation       Cardiovascular hypertension, (-) angina+ CAD  (-) Past MI and (-) Cardiac Stents (-) dysrhythmias  Rhythm:regular Rate:Normal     Neuro/Psych CVA negative psych ROS   GI/Hepatic Neg liver ROS, GERD  ,  Endo/Other  negative endocrine ROS  Renal/GU      Musculoskeletal   Abdominal   Peds  Hematology negative hematology ROS (+)   Anesthesia Other Findings Past Medical History: No date: Arthritis 45 years ago melanoma: Cancer ([place]) 11/21/14: Enlarged prostate No date: GERD (gastroesophageal reflux disease) No date: History of kidney stones No date: Hypertension No date: Renal cancer ([place]) [id number]: Stroke ([place])     Comment:  slight weakness RT side  Past Surgical History: No date: CHOLECYSTECTOMY; N/A 09/30/2015: CYSTOSCOPY WITH LITHOLAPAXY; N/A     Comment:  Procedure: CYSTOSCOPY WITH LITHOLAPAXY;  Surgeon: Hollice Espy, MD;  Location: [facility] ORS;  Service: Urology;                Laterality: N/A; 09/30/2015: HOLEP-LASER ENUCLEATION OF THE PROSTATE WITH MORCELLATION;  N/A     Comment:  Procedure: HOLEP-LASER ENUCLEATION OF THE PROSTATE WITH               MORCELLATION;  Surgeon: Hollice Espy, MD;  Location:               [facility] ORS;  Service: Urology;  Laterality: N/A; 09/08/2016: IR GENERIC HISTORICAL     Comment:  IR RADIOLOGIST EVAL & MGMT 09/08/2016 Greggory Keen, MD               [facility] INTERV RAD 4-5 years ago: JOINT REPLACEMENT; Bilateral     Comment:   kness replaced  2004: LAPAROTOMY     Comment:  1 week after choley "kinicked bile duct" 1973: SKIN CANCER EXCISION  BMI    Body Mass Index: 30.85 kg/m      Reproductive/Obstetrics negative OB ROS                             Anesthesia Physical Anesthesia Plan  ASA: II  Anesthesia Plan: Spinal   Post-op Pain Management:    Induction:   PONV Risk Score and Plan: Propofol infusion and Ondansetron  Airway Management Planned:   Additional Equipment:   Intra-op Plan:   Post-operative Plan:   Informed Consent: I have reviewed the patients History and Physical, chart, labs and discussed the procedure including the risks, benefits and alternatives for the proposed anesthesia with the patient or authorized representative who has indicated his/her understanding and acceptance.    Suspend DNR and Discussed DNR with patient.   Dental Advisory Given  Plan Discussed with: Anesthesiologist, CRNA and Surgeon  Anesthesia Plan Comments:        Anesthesia Quick Evaluation

## 2020-07-25 NOTE — Procedures (Signed)
   UROLOGY PROCEDURE NOTE  Indication: RN unable to place Foley  Briefly, 87 year old comorbid male with history of HOLEP for BPH with Dr. Erlene Quan in 2017 currently undergoing hip fracture repair with Dr. Mack Guise for right femoral neck pathologic fracture.  RN unable to place Foley beginning of case and blood at meatus noted.  The patient was prepped and draped in standard sterile fashion.  On exam, the phallus was uncircumcised and there was blood at the meatus.  Uroject was injected into the meatus.  A 20 F coud catheter was ultimately able to be passed into the bladder with some angling with return of light pink urine.  This was moderately challenging.  10 cc were placed in the balloon, and the catheter irrigated easily with 100 mL saline and cleared rapidly.  The catheter was connected to dependent drainage, and the Foley was secured to the thigh.  Plan: Maintain Foley 1 week, will arrange urology follow-up with PA for voiding trial  Nickolas Madrid, MD 07/25/2020

## 2020-07-25 NOTE — Progress Notes (Signed)
ORTHOPAEDIC CONSULTATION  REQUESTING PHYSICIAN: Loletha Grayer, MD  Chief Complaint: Right hip pain  HPI: [name] [name] is a 87 y.o. male is seen and examined in his hospital room this morning.  His son is at the bedside.  Patient has mild to moderate right hip pain but no other acute issues.  Past Medical History:  Diagnosis Date  . Arthritis   . Cancer ([place]) 45 years ago melanoma  . Enlarged prostate 11/21/14  . GERD (gastroesophageal reflux disease)   . History of kidney stones   . Hypertension   . Renal cancer ([place])   . Stroke [facility]) [id number]   slight weakness RT side   Past Surgical History:  Procedure Laterality Date  . CHOLECYSTECTOMY N/A   . CYSTOSCOPY WITH LITHOLAPAXY N/A 09/30/2015   Procedure: CYSTOSCOPY WITH LITHOLAPAXY;  Surgeon: Hollice Espy, MD;  Location: [facility] ORS;  Service: Urology;  Laterality: N/A;  . HOLEP-LASER ENUCLEATION OF THE PROSTATE WITH MORCELLATION N/A 09/30/2015   Procedure: HOLEP-LASER ENUCLEATION OF THE PROSTATE WITH MORCELLATION;  Surgeon: Hollice Espy, MD;  Location: [facility] ORS;  Service: Urology;  Laterality: N/A;  . IR GENERIC HISTORICAL  09/08/2016   IR RADIOLOGIST EVAL & MGMT 09/08/2016 Greggory Keen, MD [facility] INTER[facility] RAD  . JOINT REPLACEMENT Bilateral 4-5 years ago   kness replaced   . LAPAROTOMY  2004   1 week after choley "kinicked bile duct"  . SKIN CANCER EXCISION  1973   Social History   Socioeconomic History  . Marital status: Single    Spouse name: Not on file  . Number of children: Not on file  . Years of education: Not on file  . Highest education level: Not on file  Occupational History  . Not on file  Tobacco Use  . Smoking status: Former Smoker    Types: Cigarettes    Quit date: 11/20/1968    Years since quitting: 51.7  . Smokeless tobacco: Never Used  Substance and Sexual Activity  . Alcohol use: No    Alcohol/week: 0.0 standard drinks  . Drug use: No  . Sexual activity: Not on file  Other Topics Concern  . Not  on file  Social History Narrative  . Not on file   Social Determinants of Health   Financial Resource Strain: Not on file  Food Insecurity: Not on file  Transportation Needs: Not on file  Physical Activity: Not on file  Stress: Not on file  Social Connections: Not on file   Family History  Problem Relation Age of Onset  . Cancer Brother   . Cancer Sister   . Cancer Sister   . Cancer - Prostate Brother    No Known Allergies Prior to Admission medications   Medication Sig Start Date End Date Taking? Authorizing Provider  amLODipine (NORVASC) 5 MG tablet Take 5 mg by mouth every morning.    Yes [provider]  apixaban (ELIQUIS) 5 MG TABS tablet Take 5 mg by mouth 2 (two) times daily. 02/25/19  Yes [provider]  atorvastatin (LIPITOR) 10 MG tablet Take 10 mg by mouth daily.   Yes [provider]  beta carotene 25000 UNIT capsule Take 25,000 Units by mouth daily.   Yes [provider]  lisinopril (PRINIVIL,ZESTRIL) 40 MG tablet Take 40 mg by mouth every morning.    Yes [provider]  meloxicam (MOBIC) 15 MG tablet TAKE 1 TABLET BY MOUTH ONCE DAILY WITH BREAKFAST 06/28/18  Yes [provider]  metoprolol (LOPRESSOR) 50 MG  tablet Take 50 mg by mouth 2 (two) times daily.   Yes [provider]  potassium chloride SA (K-DUR,KLOR-CON) 20 MEQ tablet Take 20 mEq by mouth 2 (two) times daily.   Yes [provider]  Zinc 50 MG TABS Take 50 mg by mouth daily.   Yes [provider]   No results found.  Positive ROS: All other systems have been reviewed and were otherwise negative with the exception of those mentioned in the HPI and as above.  Physical Exam: General: Alert, no acute distress  MUSCULOSKELETAL: Right lower extremity:  Skin intact.  No erythema or ecchymosis.  NVI distally.  RLE shortened and externally rotated.  Assessment: Right femoral neck hip fracture with proximal femoral  metastasis  Plan: I have reviewed the details of surgery and the post-op course with the patient and his son today.   I discussed the risks and benefits of surgery. They understand the risks include but are not limited to infection, bleeding requiring blood transfusion, nerve or blood vessel injury, joint stiffness or loss of motion, persistent pain, weakness or instability, leg length discrepancy, change in lower extremity rotation, fracture, dislocation, hardware failure and the need for further surgery. Medical risks include but are not limited to DVT and pulmonary embolism, myocardial infarction, stroke, pneumonia, respiratory failure and death. Patient and his son understood these risks and wished to proceed.   Patient's last dose of eliquis was Monday evening.   Thornton Park, MD    07/25/2020 11:15 AM

## 2020-07-26 ENCOUNTER — Encounter: Payer: Self-pay | Admitting: Orthopedic Surgery

## 2020-07-26 DIAGNOSIS — N400 Enlarged prostate without lower urinary tract symptoms: Secondary | ICD-10-CM

## 2020-07-26 DIAGNOSIS — Z85528 Personal history of other malignant neoplasm of kidney: Secondary | ICD-10-CM | POA: Diagnosis not present

## 2020-07-26 DIAGNOSIS — S72001A Fracture of unspecified part of neck of right femur, initial encounter for closed fracture: Secondary | ICD-10-CM | POA: Diagnosis not present

## 2020-07-26 DIAGNOSIS — Z8673 Personal history of transient ischemic attack (TIA), and cerebral infarction without residual deficits: Secondary | ICD-10-CM | POA: Diagnosis not present

## 2020-07-26 DIAGNOSIS — I513 Intracardiac thrombosis, not elsewhere classified: Secondary | ICD-10-CM | POA: Diagnosis not present

## 2020-07-26 LAB — BASIC METABOLIC PANEL
Anion gap: 10 (ref 5–15)
BUN: 22 mg/dL (ref 8–23)
CO2: 29 mmol/L (ref 22–32)
Calcium: 8.2 mg/dL — ABNORMAL LOW (ref 8.9–10.3)
Chloride: 98 mmol/L (ref 98–111)
Creatinine, Ser: 0.62 mg/dL (ref 0.61–1.24)
GFR, Estimated: >60 mL/min (ref >60)
Glucose, Bld: 158 mg/dL — ABNORMAL HIGH (ref 70–99)
Potassium: 3.2 mmol/L — ABNORMAL LOW (ref 3.5–5.1)
Sodium: 137 mmol/L (ref 135–145)

## 2020-07-26 LAB — CBC
HCT: 33.8 % — ABNORMAL LOW (ref 39.0–52.0)
Hemoglobin: 10.7 g/dL — ABNORMAL LOW (ref 13.0–17.0)
MCH: 24.7 pg — ABNORMAL LOW (ref 26.0–34.0)
MCHC: 31.7 g/dL (ref 30.0–36.0)
MCV: 78.1 fL — ABNORMAL LOW (ref 80.0–100.0)
Platelets: 276 10*3/uL (ref 150–400)
RBC: 4.33 MIL/uL (ref 4.22–5.81)
RDW: 15 % (ref 11.5–15.5)
WBC: 12.1 10*3/uL — ABNORMAL HIGH (ref 4.0–10.5)
nRBC: 0 % (ref 0.0–0.2)

## 2020-07-26 MED ORDER — CHLORHEXIDINE GLUCONATE CLOTH 2 % EX PADS
6.0000 | MEDICATED_PAD | Freq: Every day | CUTANEOUS | Status: DC
  Administered 2020-07-26 – 2020-07-30 (×5): 6 via TOPICAL

## 2020-07-26 MED ORDER — PROPOFOL 500 MG/50ML IV EMUL
INTRAVENOUS | Status: AC
  Filled 2020-07-26: qty 500

## 2020-07-26 MED ORDER — TRAMADOL HCL 50 MG PO TABS
50.0000 mg | ORAL_TABLET | Freq: Four times a day (QID) | ORAL | Status: DC
  Administered 2020-07-26 – 2020-07-28 (×10): 50 mg via ORAL
  Filled 2020-07-26 (×10): qty 1

## 2020-07-26 MED ORDER — TAMSULOSIN HCL 0.4 MG PO CAPS
0.4000 mg | ORAL_CAPSULE | Freq: Every day | ORAL | Status: DC
  Administered 2020-07-26 – 2020-07-30 (×5): 0.4 mg via ORAL
  Filled 2020-07-26 (×5): qty 1

## 2020-07-26 MED ORDER — POTASSIUM CHLORIDE CRYS ER 20 MEQ PO TBCR
20.0000 meq | EXTENDED_RELEASE_TABLET | Freq: Two times a day (BID) | ORAL | Status: DC
  Administered 2020-07-26 – 2020-07-30 (×9): 20 meq via ORAL
  Filled 2020-07-26 (×9): qty 1

## 2020-07-26 MED ORDER — PROPOFOL 10 MG/ML IV BOLUS
INTRAVENOUS | Status: AC
  Filled 2020-07-26: qty 20

## 2020-07-26 NOTE — TOC Initial Note (Signed)
Transition of Care [facility]) - Initial/Assessment Note    Patient Details  Name: [name] [name] MRN: [id number] Date of Birth: March 24, 1932  Transition of Care [facility]) CM/SW Contact:    Beverly Sessions, RN Phone Number: 07/26/2020, 4:55 PM  Clinical Narrative:                 Patient admitted for hip fracture.  POD1  Assessment completed with son Patient lives at home alone PCP is here locally dr Netty Starring.  And is followed by the [facility] for his lung cancer.   Pharmacy [facility].  Denies issues obtaining medications  Has RW and WC in the home Patient was still able to drive short distances prior to admission.   Son is available for transport.   PT has evaluated and recommended SNF.  Son and patient in agreement Existing PASRR Fl2 sent for signature Bed search initiated  Patient is followed by [facility].  Hospice liaison has discussed with son that hospice services would have to be revoked while patient is at rehab.  Expected Discharge Plan: Skilled Nursing Facility Barriers to Discharge: No Barriers Identified   Patient Goals and CMS Choice        Expected Discharge Plan and Services Expected Discharge Plan: [place]   Discharge Planning Services: CM Consult   Living arrangements for the past 2 months: Single Family Home                                      Prior Living Arrangements/Services Living arrangements for the past 2 months: Single Family Home Lives with:: Self Patient language and need for interpreter reviewed:: Yes Do you feel safe going back to the place where you live?: Yes      Need for Family Participation in Patient Care: Yes (Comment) Care giver support system in place?: Yes (comment) Current home services: DME Criminal Activity/Legal Involvement Pertinent to Current Situation/Hospitalization: No - Comment as needed  Activities of Daily Living Home Assistive Devices/Equipment: None ADL Screening (condition at time of  admission) Patient's cognitive ability adequate to safely complete daily activities?: Yes Is the patient deaf or have difficulty hearing?: No Does the patient have difficulty seeing, even when wearing glasses/contacts?: No Does the patient have difficulty concentrating, remembering, or making decisions?: No Patient able to express need for assistance with ADLs?: Yes Does the patient have difficulty dressing or bathing?: Yes Independently performs ADLs?: Yes (appropriate for developmental age) Does the patient have difficulty walking or climbing stairs?: Yes Weakness of Legs: Right Weakness of Arms/Hands: None  Permission Sought/Granted                  Emotional Assessment       Orientation: : Oriented to Self,Oriented to Place   Psych Involvement: No (comment)  Admission diagnosis:  Fall, initial encounter [W19.XXXA] Acute hip pain, right [M25.551] Closed displaced fracture of right femoral neck ([place]) [S72.001A] Patient Active Problem List   Diagnosis Date Noted  . Malignant neoplasm metastatic to right lung ([place])   . Nausea and vomiting   . Hyperlipidemia   . Hx of renal cell cancer   . Wheeze   . Closed displaced fracture of right femoral neck ([place]) 07/23/2020  . CAD (coronary artery disease) 07/23/2020  . Lung cancer ([place]) 07/23/2020  . Mural thrombus of heart 07/23/2020  . Fall   . Aortic atherosclerosis ([place]) 08/31/2017  . Encounter  for general adult medical examination without abnormal findings 05/27/2016  . Chronic hypokalemia 11/21/2015  . BPH (benign prostatic hyperplasia) 08/09/2015  . GERD without esophagitis 08/09/2015  . Essential hypertension 08/09/2015  . History of Kaposi's sarcoma 08/09/2015  . Osteoarthritis 08/09/2015  . Renal cell cancer ([place]) 03/15/2015  . Left renal mass   . Renal mass, left   . Pure hypercholesterolemia 08/20/2014  . Aphasia 07/26/2014  . History of stroke 07/26/2014  . Weakness 07/26/2014   PCP:  Dion Body,  MD Pharmacy:   [facility] [address], [place] - [place] [address] [place] [address] Phone: [phone] Fax: [phone]     Social Determinants of Health (SDOH) Interventions    Readmission Risk Interventions No flowsheet data found.

## 2020-07-26 NOTE — Progress Notes (Signed)
Subjective:  POD #1 s/p right hip hemiarthroplasty.   Patient reports right hip pain as mild.  Patient is sitting up in bed eating his lunch.  His son is at the bedside.  Patient denies chest pain or shortness of breath.  He remains on nasal cannula.  Objective:   VITALS:   Vitals:   07/26/20 0508 07/26/20 0708 07/26/20 0740 07/26/20 1122  BP: 139/66  125/66 122/64  Pulse: 96  (!) 105 98  Resp:   20 18  Temp: 98.5 F (36.9 C)  98.4 F (36.9 C) 98.7 F (37.1 C)  TempSrc:      SpO2: 95% 92% 93% 92%  Weight:      Height:        PHYSICAL EXAM: Right lower extremity Neurovascular intact Sensation intact distally Intact pulses distally Dorsiflexion/Plantar flexion intact Incision: dressing C/D/I No cellulitis present Compartment soft  LABS  Results for orders placed or performed during the hospital encounter of 07/23/20 (from the past 24 hour(s))  CBC     Status: Abnormal   Collection Time: 07/26/20  6:50 AM  Result Value Ref Range   WBC 12.1 (H) 4.0 - 10.5 K/uL   RBC 4.33 4.22 - 5.81 MIL/uL   Hemoglobin 10.7 (L) 13.0 - 17.0 g/dL   HCT 33.8 (L) 39.0 - 52.0 %   MCV 78.1 (L) 80.0 - 100.0 fL   MCH 24.7 (L) 26.0 - 34.0 pg   MCHC 31.7 30.0 - 36.0 g/dL   RDW 15.0 11.5 - 15.5 %   Platelets 276 150 - 400 K/uL   nRBC 0.0 0.0 - 0.2 %  Basic metabolic panel     Status: Abnormal   Collection Time: 07/26/20  6:50 AM  Result Value Ref Range   Sodium 137 135 - 145 mmol/L   Potassium 3.2 (L) 3.5 - 5.1 mmol/L   Chloride 98 98 - 111 mmol/L   CO2 29 22 - 32 mmol/L   Glucose, Bld 158 (H) 70 - 99 mg/dL   BUN 22 8 - 23 mg/dL   Creatinine, Ser 0.62 0.61 - 1.24 mg/dL   Calcium 8.2 (L) 8.9 - 10.3 mg/dL   GFR, Estimated >60 >60 mL/min   Anion gap 10 5 - 15    DG Chest Portable 1 View  Result Date: 07/25/2020 CLINICAL DATA:  Hypoxia EXAM: PORTABLE CHEST 1 VIEW COMPARISON:  07/23/2020 chest radiograph and prior studies FINDINGS: Cardiomediastinal silhouette is unchanged. RIGHT UPPER  lobe opacity is unchanged. Subsegmental atelectasis within the mid and LOWER LEFT lung are again identified. No large pleural effusion or pneumothorax. IMPRESSION: Unchanged appearance of the chest with RIGHT UPPER lobe opacity and LEFT lung subsegmental atelectasis. Electronically Signed   By: Margarette Canada M.D.   On: 07/25/2020 18:30   DG Hip Port Unilat With Pelvis 1V Right  Result Date: 07/25/2020 CLINICAL DATA:  Status post RIGHT hip hemiarthroplasty EXAM: DG HIP (WITH OR WITHOUT PELVIS) 1V PORT RIGHT COMPARISON:  07/23/2020 radiograph FINDINGS: RIGHT hip hemiarthroplasty identified without complicating features. There is no evidence of dislocation. IMPRESSION: RIGHT hip hemiarthroplasty without complicating features. Electronically Signed   By: Margarette Canada M.D.   On: 07/25/2020 18:29    Assessment/Plan: 1 Day Post-Op   Principal Problem:   Closed displaced fracture of right femoral neck (HCC) Active Problems:   Essential hypertension   History of stroke   Pure hypercholesterolemia   Fall   CAD (coronary artery disease)   Mural thrombus of heart   Nausea and  vomiting   Hyperlipidemia   Hx of renal cell cancer   Wheeze   Malignant neoplasm metastatic to right lung [facility])  Patient is doing well postop.  Patient is weightbearing as tolerated on the right lower extremity with posterior hip precautions.  Continue physical therapy.  Patient will require a skilled nursing facility upon discharge.  Patient had a Foley catheter placed by the urologist in the [place] yesterday which will remain in place until he follows up with Dr. Erlene Quan in the office in 1 week.  Continue Lovenox 40 mg daily for DVT prophylaxis until follow-up with Dr. Mack Guise in the office in 2 weeks.    Thornton Park , MD 07/26/2020, 1:26 PM

## 2020-07-26 NOTE — Progress Notes (Addendum)
[place] [facility] [place] ([place] patient RN note:  [name] [name] is a current hospice patient with [facility] with a terminal diagnosis of lung cancer. He is independent and lives alone but reports that on 01.04,  he got up to go to the bathroom,tripped and fellin the morning. He did not lose consciousness .He thinks he might have hit his head but he had no headache and no neck pain.He developed severe pain in the right hip, which was sharp, constant, severe, nonradiating, aggravated by movement.He had no numbness nor tinglingin his legs. He was brought to the Emergency Room by EMS. Son, [name] reports that he forgot to contact the Hospice agency prior to [facility] Liaison visit on 01.06. Patient was admitted to [facility] on 01.04.22 with a diagnosis of right femoral neck fracture. Per Dr. Gilford Rile with [facility], this is a related hospital admission. Patient is a DNR.  Visited patient and son, [name] at bedside. Patient was standing and working with OT. After returning to bed he stated that he was doing well, and having minimal pain. Denies any nausea. Alert and oriented. Son reports that patient had been agitated and disoriented this morning but seemed to be at baseline now. Foley catheter placed by Urology. Son stated plan is still for patient to go to Rehab and understands the process of revocation. Information has been shared with Meagan Hagwood, TOC and no discharge date is known at this time.  Vital iSgns: BP 113/61, HR 91, Resp 17, Temp. 98.2, O2 sats 95% on 2 L [place].  I & O: From 01.06 to 01.07---1,517ml / 853 ml  Abnormal labs: Potassium: 3.2 (L) Glucose: 158 (H) Calcium: 8.2 (L) WBC: 12.1 (H) Hemoglobin: 10.7 (L) HCT: 33.8 (L) MCV: 78.1 (L) MCH: 24.7 (L)  Diagnostics: none new  IV/PRN Meds: 0.9 % sodium ch loride infusion Rate: 75 mL/hr Dose: 75 mL/hr Freq: Continuous Route: IV  haloperidol lactate (HALDOL) injection 1 mg Dose: 1  mg Freq: Every 6 hours PRN Route: IV  ceFAZolin (ANCEF) IVPB 2g/100 mL premix Dose: 2 g Freq: Every 6 hours Route: IV  Problem List: Principal Problem:   Closed displaced fracture of right femoral neck (HCC) Active Problems:   Essential hypertension   History of stroke   Pure hypercholesterolemia   Fall   CAD (coronary artery disease)   Mural thrombus of heart   Nausea and vomiting   Hyperlipidemia   Hx of renal cell cancer   Wheeze   Malignant neoplasm metastatic to right lung [facility])  Discharge Planning: Ongoing--Plans to discharge to SNF for Rehab and revoke Hospice  Family Contact: Spoke with son, [name] in room.  IDG: Updated  Goals of Care: Clear  Reason for continued need for GIP:  need for frequent monitoring due to confusion post surgery, IV medications, Need for foley catheter placement, working with PT.   Please call with any hospice related questions or concerns.  Thank you.  Zandra Abts, RN [facility] Liaison [phone]  .
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
## 2020-07-26 NOTE — Care Management Important Message (Signed)
Important Message  Patient Details  Name: [name] [name] MRN: [id number] Date of Birth: 22-Nov-1931   Medicare Important Message Given:        Juliann Pulse A Yoshito Gaza 07/26/2020, 10:58 AM
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
## 2020-07-26 NOTE — NC FL2 (Signed)
[place] LEVEL OF CARE SCREENING TOOL     IDENTIFICATION  Patient Name: [name] [name] Birthdate: 1932-04-29 Sex: male Admission Date (Current Location): 07/23/2020  [facility] [place] Number:  Engineering geologist and Address:         Provider Number: [id number]  Attending Physician Name and Address:  Loletha Grayer, MD  Relative Name and Phone Number:       Current Level of Care: Hospital Recommended Level of Care: [place] Prior Approval Number:    Date Approved/Denied:   PASRR Number: [id number]  Discharge Plan: SNF    Current Diagnoses: Patient Active Problem List   Diagnosis Date Noted  . Malignant neoplasm metastatic to right lung ([place])   . Nausea and vomiting   . Hyperlipidemia   . Hx of renal cell cancer   . Wheeze   . Closed displaced fracture of right femoral neck ([place]) 07/23/2020  . CAD (coronary artery disease) 07/23/2020  . Lung cancer ([place]) 07/23/2020  . Mural thrombus of heart 07/23/2020  . Fall   . Aortic atherosclerosis ([place]) 08/31/2017  . Encounter for general adult medical examination without abnormal findings 05/27/2016  . Chronic hypokalemia 11/21/2015  . BPH (benign prostatic hyperplasia) 08/09/2015  . GERD without esophagitis 08/09/2015  . Essential hypertension 08/09/2015  . History of Kaposi's sarcoma 08/09/2015  . Osteoarthritis 08/09/2015  . Renal cell cancer ([place]) 03/15/2015  . Left renal mass   . Renal mass, left   . Pure hypercholesterolemia 08/20/2014  . Aphasia 07/26/2014  . History of stroke 07/26/2014  . Weakness 07/26/2014    Orientation RESPIRATION BLADDER Height & Weight     Self,Place  O2 (2L [place]) Indwelling catheter Weight: 97.5 kg Height:  5\' 10"  (177.8 cm)  BEHAVIORAL SYMPTOMS/MOOD NEUROLOGICAL BOWEL NUTRITION STATUS      Continent    AMBULATORY STATUS COMMUNICATION OF NEEDS Skin   Extensive Assist Verbally Surgical wounds (Hip incision)                       Personal  Care Assistance Level of Assistance              Functional Limitations Info             SPECIAL CARE FACTORS FREQUENCY  OT (By licensed OT),PT (By licensed PT)                    Contractures Contractures Info: Not present    Additional Factors Info  Code Status,Allergies (Patient followed by hospice.  will revoke services for rehab) Code Status Info: DNR Allergies Info: NKDA           Current Medications (07/26/2020):  This is the current hospital active medication list Current Facility-Administered Medications  Medication Dose Route Frequency Provider Last Rate Last Admin  . 0.9 %  sodium chloride infusion  75 mL/hr Intravenous Continuous Thornton Park, MD   Stopped at 07/26/20 1321  . acetaminophen (TYLENOL) suppository 650 mg  650 mg Rectal Q6H PRN Thornton Park, MD      . acetaminophen (TYLENOL) tablet 325-650 mg  325-650 mg Oral Q6H PRN Thornton Park, MD      . alum & mag hydroxide-simeth (MAALOX/MYLANTA) 200-200-20 MG/5ML suspension 30 mL  30 mL Oral Q4H PRN Thornton Park, MD      . amLODipine (NORVASC) tablet 5 mg  5 mg Oral q morning - 10a Thornton Park, MD   5 mg at 07/26/20 0933  .  apixaban (ELIQUIS) tablet 5 mg  5 mg Oral BID Thornton Park, MD   5 mg at 07/26/20 0932  . atorvastatin (LIPITOR) tablet 10 mg  10 mg Oral Daily Thornton Park, MD   10 mg at 07/26/20 0932  . bisacodyl (DULCOLAX) suppository 10 mg  10 mg Rectal Daily PRN Thornton Park, MD      . Chlorhexidine Gluconate Cloth 2 % PADS 6 each  6 each Topical Daily Loletha Grayer, MD   6 each at 07/26/20 [phone]  . docusate sodium (COLACE) capsule 100 mg  100 mg Oral BID Thornton Park, MD   100 mg at 07/26/20 0931  . feeding supplement (ENSURE ENLIVE / ENSURE PLUS) liquid 237 mL  237 mL Oral TID BM Thornton Park, MD   237 mL at 07/26/20 1356  . hydrALAZINE (APRESOLINE) injection 5 mg  5 mg Intravenous Q2H PRN Thornton Park, MD      . HYDROcodone-acetaminophen (NORCO)  7.5-325 MG per tablet 1-2 tablet  1-2 tablet Oral Q4H PRN Thornton Park, MD      . HYDROcodone-acetaminophen (NORCO/VICODIN) 5-325 MG per tablet 1-2 tablet  1-2 tablet Oral Q4H PRN Thornton Park, MD      . hydrOXYzine (ATARAX/VISTARIL) tablet 10 mg  10 mg Oral TID PRN Thornton Park, MD      . ipratropium-albuterol (DUONEB) 0.5-2.5 (3) MG/3ML nebulizer solution 3 mL  3 mL Nebulization Q6H Thornton Park, MD   3 mL at 07/26/20 1330  . lisinopril (ZESTRIL) tablet 40 mg  40 mg Oral q morning - 10a Thornton Park, MD   40 mg at 07/26/20 0933  . magnesium citrate solution 1 Bottle  1 Bottle Oral Once PRN Thornton Park, MD      . methocarbamol (ROBAXIN) tablet 500 mg  500 mg Oral Q8H PRN Thornton Park, MD      . metoprolol tartrate (LOPRESSOR) tablet 50 mg  50 mg Oral BID Thornton Park, MD   50 mg at 07/26/20 0932  . morphine 2 MG/ML injection 0.5-1 mg  0.5-1 mg Intravenous Q2H PRN Thornton Park, MD      . multivitamin with minerals tablet 1 tablet  1 tablet Oral Daily Thornton Park, MD   1 tablet at 07/26/20 0931  . ondansetron (ZOFRAN) tablet 4 mg  4 mg Oral Q6H PRN Thornton Park, MD       Or  . ondansetron [facility]) injection 4 mg  4 mg Intravenous Q6H PRN Thornton Park, MD      . polyethylene glycol (MIRALAX / GLYCOLAX) packet 17 g  17 g Oral Daily PRN Thornton Park, MD      . potassium chloride SA (KLOR-CON) CR tablet 20 mEq  20 mEq Oral BID Loletha Grayer, MD   20 mEq at 07/26/20 1125  . senna (SENOKOT) tablet 8.6 mg  1 tablet Oral BID Thornton Park, MD   8.6 mg at 07/26/20 0932  . senna-docusate (Senokot-S) tablet 1 tablet  1 tablet Oral QHS PRN Thornton Park, MD      . tamsulosin [facility]) capsule 0.4 mg  0.4 mg Oral QPC breakfast Loletha Grayer, MD   0.4 mg at 07/26/20 0936  . traMADol (ULTRAM) tablet 50 mg  50 mg Oral Q6H Renda Rolls, RPH   50 mg at 07/26/20 1356  . zinc sulfate capsule 220 mg  220 mg Oral Daily Thornton Park, MD   220 mg at  07/26/20 0932     Discharge Medications: Please see discharge summary for a list of discharge  medications.  Relevant Imaging Results:  Relevant Lab Results:   Additional Information ss [id number]  Beverly Sessions, RN

## 2020-07-26 NOTE — Progress Notes (Signed)
Patient ID: [name] [name], male   DOB: 12-15-1931, 87 y.o.   MRN: [id number] [facility] PROGRESS NOTE  [name] [name] [name] [name] DOB: [birth year] DOA: 07/23/2020 PCP: Dion Body, MD  HPI/Subjective: Patient complains of no pain.  Had hip surgery last night.  Foley catheter had to be placed and recommended staying for 1 week.  Patient feels okay.  Offers no complaints.  Breathing okay.  Objective: Vitals:   07/26/20 1331 07/26/20 1553  BP:  113/61  Pulse:  91  Resp:  17  Temp:  98.2 F (36.8 C)  SpO2: 92% 95%    Intake/Output Summary (Last 24 hours) at 07/26/2020 1636 Last data filed at 07/26/2020 1413 Gross per 24 hour  Intake 1293.29 ml  Output 500 ml  Net 793.29 ml   Filed Weights   07/23/20 0623  Weight: 97.5 kg    ROS: Review of Systems  Respiratory: Negative for shortness of breath.   Cardiovascular: Negative for chest pain.  Gastrointestinal: Negative for abdominal pain, nausea and vomiting.  Musculoskeletal: Negative for joint pain.   Exam: Physical Exam HENT:     Head: Normocephalic.     Mouth/Throat:     Pharynx: No oropharyngeal exudate.  Eyes:     General: Lids are normal.     Conjunctiva/sclera: Conjunctivae normal.     Pupils: Pupils are equal, round, and reactive to light.  Cardiovascular:     Rate and Rhythm: Normal rate and regular rhythm.     Heart sounds: Normal heart sounds, S1 normal and S2 normal.  Pulmonary:     Breath sounds: No decreased breath sounds, wheezing, rhonchi or rales.  Abdominal:     Palpations: Abdomen is soft.     Tenderness: There is no abdominal tenderness.  Musculoskeletal:     Right lower leg: No swelling.     Left lower leg: No swelling.  Skin:    General: Skin is warm.     Findings: No rash.  Neurological:     Mental Status: He is alert and oriented to person, place, and time.       Data Reviewed: Basic Metabolic Panel: Recent Labs  Lab 07/23/20 0631 07/24/20 0322 07/25/20 0322 07/26/20 0650   NA 137 138 137 137  K 3.5 3.6 3.3* 3.2*  CL 103 102 98 98  CO2 23 28 30 29   GLUCOSE 159* 124* 99 158*  BUN 12 14 18 22   CREATININE 0.58* 0.58* 0.62 0.62  CALCIUM 8.4* 8.5* 8.0* 8.2*   Liver Function Tests: Recent Labs  Lab 07/23/20 0631  AST 15  ALT 12  ALKPHOS 88  BILITOT 0.7  PROT 6.8  ALBUMIN 2.9*   CBC: Recent Labs  Lab 07/23/20 0631 07/24/20 0322 07/25/20 0322 07/26/20 0650  WBC 9.5 12.1* 9.6 12.1*  NEUTROABS 8.0*  --   --   --   HGB 12.1* 11.7* 11.9* 10.7*  HCT 38.5* 37.2* 36.5* 33.8*  MCV 77.9* 78.8* 77.3* 78.1*  PLT 304 326 293 276   Cardiac Enzymes: Recent Labs  Lab 07/23/20 0631  CKTOTAL 50   BNP (last 3 results) Recent Labs    07/23/20 0631  BNP 71.8     Recent Results (from the past 240 hour(s))  Resp Panel by RT-PCR (Flu A&B, Covid) Nasopharyngeal Swab     Status: None   Collection Time: 07/23/20  6:50 AM   Specimen: Nasopharyngeal Swab; Nasopharyngeal(NP) swabs in vial transport medium  Result Value Ref Range Status   SARS Coronavirus 2  by RT PCR NEGATIVE NEGATIVE Final    Comment: (NOTE) SARS-CoV-2 target nucleic acids are NOT DETECTED.  The SARS-CoV-2 RNA is generally detectable in upper respiratory specimens during the acute phase of infection. The lowest concentration of SARS-CoV-2 viral copies this assay can detect is 138 copies/mL. A negative result does not preclude SARS-Cov-2 infection and should not be used as the sole basis for treatment or other patient management decisions. A negative result may occur with  improper specimen collection/handling, submission of specimen other than nasopharyngeal swab, presence of viral mutation(s) within the areas targeted by this assay, and inadequate number of viral copies(<138 copies/mL). A negative result must be combined with clinical observations, patient history, and epidemiological information. The expected result is Negative.  Fact Sheet for Patients:   EntrepreneurPulse.com.au  Fact Sheet for Healthcare Providers:  IncredibleEmployment.be  This test is no t yet approved or cleared by the [place] FDA and  has been authorized for detection and/or diagnosis of SARS-CoV-2 by FDA under an Emergency Use Authorization (EUA). This EUA will remain  in effect (meaning this test can be used) for the duration of the COVID-19 declaration under Section 564(b)(1) of the Act, 21 U.S.C.section 360bbb-3(b)(1), unless the authorization is terminated  or revoked sooner.       Influenza A by PCR NEGATIVE NEGATIVE Final   Influenza B by PCR NEGATIVE NEGATIVE Final    Comment: (NOTE) The Xpert Xpress SARS-CoV-2/FLU/RSV plus assay is intended as an aid in the diagnosis of influenza from Nasopharyngeal swab specimens and should not be used as a sole basis for treatment. Nasal washings and aspirates are unacceptable for Xpert Xpress SARS-CoV-2/FLU/RSV testing.  Fact Sheet for Patients: EntrepreneurPulse.com.au  Fact Sheet for Healthcare Providers: IncredibleEmployment.be  This test is not yet approved or cleared by the [place] FDA and has been authorized for detection and/or diagnosis of SARS-CoV-2 by FDA under an Emergency Use Authorization (EUA). This EUA will remain in effect (meaning this test can be used) for the duration of the COVID-19 declaration under Section 564(b)(1) of the Act, 21 U.S.C. section 360bbb-3(b)(1), unless the authorization is terminated or revoked.  Performed at [facility], [address], [place], [place] [address]   Surgical PCR screen     Status: None   Collection Time: 07/24/20 11:12 AM   Specimen: Nasal Mucosa; Nasal Swab  Result Value Ref Range Status   MRSA, PCR NEGATIVE NEGATIVE Final   Staphylococcus aureus NEGATIVE NEGATIVE Final    Comment: (NOTE) The Xpert SA Assay (FDA approved for NASAL specimens in patients  31 years of age and older), is one component of a comprehensive surveillance program. It is not intended to diagnose infection nor to guide or monitor treatment. Performed at [facility], [address] [place], [place] [address]      Studies: DG Chest Portable 1 View  Result Date: 07/25/2020 CLINICAL DATA:  Hypoxia EXAM: PORTABLE CHEST 1 VIEW COMPARISON:  07/23/2020 chest radiograph and prior studies FINDINGS: Cardiomediastinal silhouette is unchanged. RIGHT UPPER lobe opacity is unchanged. Subsegmental atelectasis within the mid and LOWER LEFT lung are again identified. No large pleural effusion or pneumothorax. IMPRESSION: Unchanged appearance of the chest with RIGHT UPPER lobe opacity and LEFT lung subsegmental atelectasis. Electronically Signed   By: Margarette Canada M.D.   On: 07/25/2020 18:30   DG Hip Port Unilat With Pelvis 1V Right  Result Date: 07/25/2020 CLINICAL DATA:  Status post RIGHT hip hemiarthroplasty EXAM: DG HIP (WITH OR WITHOUT PELVIS) 1V  PORT RIGHT COMPARISON:  07/23/2020 radiograph FINDINGS: RIGHT hip hemiarthroplasty identified without complicating features. There is no evidence of dislocation. IMPRESSION: RIGHT hip hemiarthroplasty without complicating features. Electronically Signed   By: Margarette Canada M.D.   On: 07/25/2020 18:29    Scheduled Meds: . amLODipine  5 mg Oral q morning - 10a  . apixaban  5 mg Oral BID  . atorvastatin  10 mg Oral Daily  . Chlorhexidine Gluconate Cloth  6 each Topical Daily  . docusate sodium  100 mg Oral BID  . feeding supplement  237 mL Oral TID BM  . ipratropium-albuterol  3 mL Nebulization Q6H  . lisinopril  40 mg Oral q morning - 10a  . metoprolol tartrate  50 mg Oral BID  . multivitamin with minerals  1 tablet Oral Daily  . potassium chloride  20 mEq Oral BID  . senna  1 tablet Oral BID  . tamsulosin  0.4 mg Oral QPC breakfast  . traMADol  50 mg Oral Q6H  . zinc sulfate  220 mg Oral Daily   Continuous Infusions: .  sodium chloride Stopped (07/26/20 1321)    Assessment/Plan:  1. Closed right femoral neck fracture requiring operative repair.  Patient not having much pain at all.  Convert to Tylenol as quickly as possible.  Will need rehab on postoperative day three. 2. Inability to place Foley prior to surgery.  Needed urology to place.  Will have to stay in for 1 week.  Started Flomax. 3. Previous mural thrombus of the heart.  On Eliquis 4. History of stroke on Eliquis 5. Hyperlipidemia unspecified.  On Lipitor 6. History of renal cell cancer and melanoma.  Patient has metastases to the right lung and did receive treatment for this in the past with radiation. 7. Right knee gave out prior to falling. 8. Taper off oxygen soon as possible.        Code Status:     Code Status Orders  (From admission, onward)         Start     Ordered   07/23/20 1436  Do not attempt resuscitation (DNR)  Continuous        07/23/20 1435        Code Status History    Date Active Date Inactive Code Status Order ID Comments User Context   07/23/2020 1141 07/23/2020 1435 Full Code [id number]  Ivor Costa, MD ED   07/23/2020 0901 07/23/2020 1141 DNR [id number]  Ivor Costa, MD ED   Advance Care Planning Activity     Family Communication: Son on the phone Disposition Plan: Status is: Inpatient  Dispo: The patient is from: Home              Anticipated d/c is to: Rehab              Anticipated d/c date is: 07/28/2020 at the earliest but more likely 07/29/2020.              Patient currently postoperative day one for hip repair.  Taper off oxygen.  Time spent: 27 minutes  [place]
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
## 2020-07-26 NOTE — Anesthesia Postprocedure Evaluation (Signed)
Anesthesia Post Note  Patient: [name] [name]  Procedure(s) Performed: ARTHROPLASTY BIPOLAR HIP (HEMIARTHROPLASTY) (Right Hip)  Patient location during evaluation: Nursing Unit Anesthesia Type: Spinal Level of consciousness: oriented and awake and alert Pain management: pain level controlled Vital Signs Assessment: post-procedure vital signs reviewed and stable Respiratory status: spontaneous breathing and respiratory function stable Cardiovascular status: blood pressure returned to baseline and stable Postop Assessment: no headache, no backache, no apparent nausea or vomiting and patient able to bend at knees Anesthetic complications: no   No complications documented.   Last Vitals:  Vitals:   07/26/20 0508 07/26/20 0708  BP: 139/66   Pulse: 96   Resp:    Temp: 36.9 C   SpO2: 95% 92%    Last Pain:  Vitals:   07/26/20 0617  TempSrc:   PainSc: 0-No pain                 Malayah Demuro Lily Peer

## 2020-07-27 DIAGNOSIS — S72001A Fracture of unspecified part of neck of right femur, initial encounter for closed fracture: Secondary | ICD-10-CM | POA: Diagnosis not present

## 2020-07-27 LAB — CBC
HCT: 30.9 % — ABNORMAL LOW (ref 39.0–52.0)
Hemoglobin: 10.1 g/dL — ABNORMAL LOW (ref 13.0–17.0)
MCH: 25.4 pg — ABNORMAL LOW (ref 26.0–34.0)
MCHC: 32.7 g/dL (ref 30.0–36.0)
MCV: 77.6 fL — ABNORMAL LOW (ref 80.0–100.0)
Platelets: 271 10*3/uL (ref 150–400)
RBC: 3.98 MIL/uL — ABNORMAL LOW (ref 4.22–5.81)
RDW: 15.3 % (ref 11.5–15.5)
WBC: 13.3 10*3/uL — ABNORMAL HIGH (ref 4.0–10.5)
nRBC: 0 % (ref 0.0–0.2)

## 2020-07-27 NOTE — Progress Notes (Signed)
Subjective: 2 Days Post-Op Procedure(s) (LRB): ARTHROPLASTY BIPOLAR HIP (HEMIARTHROPLASTY) (Right)   Patient is alert and up in a chair.  Is walked a little bit he says.  Pain is controlled.  Planning on going to skilled nursing.  Patient reports pain as mild.  Objective:   VITALS:   Vitals:   07/27/20 0900 07/27/20 1131  BP:  108/60  Pulse:  90  Resp:  18  Temp:  98.4 F (36.9 C)  SpO2: 97% 92%    Neurologically intact Sensation intact distally Dorsiflexion/Plantar flexion intact Incision: dressing C/D/I  LABS Recent Labs    07/25/20 0322 07/26/20 0650 07/27/20 0518  HGB 11.9* 10.7* 10.1*  HCT 36.5* 33.8* 30.9*  WBC 9.6 12.1* 13.3*  PLT 293 276 271    Recent Labs    07/25/20 0322 07/26/20 0650  NA 137 137  K 3.3* 3.2*  BUN 18 22  CREATININE 0.62 0.62  GLUCOSE 99 158*    No results for input(s): LABPT, INR in the last 72 hours.   Assessment/Plan: 2 Days Post-Op Procedure(s) (LRB): ARTHROPLASTY BIPOLAR HIP (HEMIARTHROPLASTY) (Right)   Advance diet Up with therapy Discharge to SNF when available. Follow-up with Dr. Mack Guise in 1 to 2 weeks.

## 2020-07-27 NOTE — Progress Notes (Addendum)
Patient ID: [name] [name], male   DOB: [birth year], 87 y.o.   MRN: [id number] [facility] PROGRESS NOTE  COURT [name] [name]:[id number] DOB: 1932/01/10 DOA: 07/23/2020 PCP: Dion Body, MD  HPI/Subjective: Patient feels okay.  Feels a pulling in his hip.  No shortness of breath or chest pain.  Admitted with hip fracture.  Objective: Vitals:   07/27/20 0900 07/27/20 1131  BP:  108/60  Pulse:  90  Resp:  18  Temp:  98.4 F (36.9 C)  SpO2: 97% 92%    Intake/Output Summary (Last 24 hours) at 07/27/2020 1229 Last data filed at 07/27/2020 1100 Gross per 24 hour  Intake 240 ml  Output 950 ml  Net -710 ml   Filed Weights   07/23/20 0623  Weight: 97.5 kg    ROS: Review of Systems  Respiratory: Negative for shortness of breath.   Cardiovascular: Negative for chest pain.  Gastrointestinal: Negative for abdominal pain, nausea and vomiting.  Musculoskeletal: Positive for joint pain.   Exam: Physical Exam HENT:     Head: Normocephalic.     Mouth/Throat:     Pharynx: No oropharyngeal exudate.  Eyes:     General: Lids are normal.     Conjunctiva/sclera: Conjunctivae normal.     Pupils: Pupils are equal, round, and reactive to light.  Cardiovascular:     Rate and Rhythm: Normal rate and regular rhythm.     Heart sounds: Normal heart sounds, S1 normal and S2 normal.  Pulmonary:     Breath sounds: No decreased breath sounds, wheezing, rhonchi or rales.  Abdominal:     Palpations: Abdomen is soft.     Tenderness: There is no abdominal tenderness.  Musculoskeletal:     Right lower leg: No swelling.     Left lower leg: No swelling.  Skin:    General: Skin is warm.     Findings: No rash.  Neurological:     Mental Status: He is alert and oriented to person, place, and time.       Data Reviewed: Basic Metabolic Panel: Recent Labs  Lab 07/23/20 0631 07/24/20 0322 07/25/20 0322 07/26/20 0650  NA 137 138 137 137  K 3.5 3.6 3.3* 3.2*  CL 103 102 98 98  CO2 23 28 30 29    GLUCOSE 159* 124* 99 158*  BUN 12 14 18 22   CREATININE 0.58* 0.58* 0.62 0.62  CALCIUM 8.4* 8.5* 8.0* 8.2*   Liver Function Tests: Recent Labs  Lab 07/23/20 0631  AST 15  ALT 12  ALKPHOS 88  BILITOT 0.7  PROT 6.8  ALBUMIN 2.9*   CBC: Recent Labs  Lab 07/23/20 0631 07/24/20 0322 07/25/20 0322 07/26/20 0650 07/27/20 0518  WBC 9.5 12.1* 9.6 12.1* 13.3*  NEUTROABS 8.0*  --   --   --   --   HGB 12.1* 11.7* 11.9* 10.7* 10.1*  HCT 38.5* 37.2* 36.5* 33.8* 30.9*  MCV 77.9* 78.8* 77.3* 78.1* 77.6*  PLT 304 326 293 276 271   Cardiac Enzymes: Recent Labs  Lab 07/23/20 0631  CKTOTAL 50   BNP (last 3 results) Recent Labs    07/23/20 0631  BNP 71.8     Recent Results (from the past 240 hour(s))  Resp Panel by RT-PCR (Flu A&B, Covid) Nasopharyngeal Swab     Status: None   Collection Time: 07/23/20  6:50 AM   Specimen: Nasopharyngeal Swab; Nasopharyngeal(NP) swabs in vial transport medium  Result Value Ref Range Status   SARS Coronavirus 2 by RT  PCR NEGATIVE NEGATIVE Final    Comment: (NOTE) SARS-CoV-2 target nucleic acids are NOT DETECTED.  The SARS-CoV-2 RNA is generally detectable in upper respiratory specimens during the acute phase of infection. The lowest concentration of SARS-CoV-2 viral copies this assay can detect is 138 copies/mL. A negative result does not preclude SARS-Cov-2 infection and should not be used as the sole basis for treatment or other patient management decisions. A negative result may occur with  improper specimen collection/handling, submission of specimen other than nasopharyngeal swab, presence of viral mutation(s) within the areas targeted by this assay, and inadequate number of viral copies(<138 copies/mL). A negative result must be combined with clinical observations, patient history, and epidemiological information. The expected result is Negative.  Fact Sheet for Patients:  EntrepreneurPulse.com.au  Fact Sheet  for Healthcare Providers:  IncredibleEmployment.be  This test is no t yet approved or cleared by the [place] FDA and  has been authorized for detection and/or diagnosis of SARS-CoV-2 by FDA under an Emergency Use Authorization (EUA). This EUA will remain  in effect (meaning this test can be used) for the duration of the COVID-19 declaration under Section 564(b)(1) of the Act, 21 U.S.C.section 360bbb-3(b)(1), unless the authorization is terminated  or revoked sooner.       Influenza A by PCR NEGATIVE NEGATIVE Final   Influenza B by PCR NEGATIVE NEGATIVE Final    Comment: (NOTE) The Xpert Xpress SARS-CoV-2/FLU/RSV plus assay is intended as an aid in the diagnosis of influenza from Nasopharyngeal swab specimens and should not be used as a sole basis for treatment. Nasal washings and aspirates are unacceptable for Xpert Xpress SARS-CoV-2/FLU/RSV testing.  Fact Sheet for Patients: EntrepreneurPulse.com.au  Fact Sheet for Healthcare Providers: IncredibleEmployment.be  This test is not yet approved or cleared by the [place] FDA and has been authorized for detection and/or diagnosis of SARS-CoV-2 by FDA under an Emergency Use Authorization (EUA). This EUA will remain in effect (meaning this test can be used) for the duration of the COVID-19 declaration under Section 564(b)(1) of the Act, 21 U.S.C. section 360bbb-3(b)(1), unless the authorization is terminated or revoked.  Performed at [facility], [address], [place], [place] [address]   Surgical PCR screen     Status: None   Collection Time: 07/24/20 11:12 AM   Specimen: Nasal Mucosa; Nasal Swab  Result Value Ref Range Status   MRSA, PCR NEGATIVE NEGATIVE Final   Staphylococcus aureus NEGATIVE NEGATIVE Final    Comment: (NOTE) The Xpert SA Assay (FDA approved for NASAL specimens in patients 86 years of age and older), is one component of a  comprehensive surveillance program. It is not intended to diagnose infection nor to guide or monitor treatment. Performed at [facility], [address] [place], [place] [address]      Studies: DG Chest Portable 1 View  Result Date: 07/25/2020 CLINICAL DATA:  Hypoxia EXAM: PORTABLE CHEST 1 VIEW COMPARISON:  07/23/2020 chest radiograph and prior studies FINDINGS: Cardiomediastinal silhouette is unchanged. RIGHT UPPER lobe opacity is unchanged. Subsegmental atelectasis within the mid and LOWER LEFT lung are again identified. No large pleural effusion or pneumothorax. IMPRESSION: Unchanged appearance of the chest with RIGHT UPPER lobe opacity and LEFT lung subsegmental atelectasis. Electronically Signed   By: Margarette Canada M.D.   On: 07/25/2020 18:30   DG Hip Port Unilat With Pelvis 1V Right  Result Date: 07/25/2020 CLINICAL DATA:  Status post RIGHT hip hemiarthroplasty EXAM: DG HIP (WITH OR WITHOUT PELVIS) 1V PORT RIGHT  COMPARISON:  07/23/2020 radiograph FINDINGS: RIGHT hip hemiarthroplasty identified without complicating features. There is no evidence of dislocation. IMPRESSION: RIGHT hip hemiarthroplasty without complicating features. Electronically Signed   By: Margarette Canada M.D.   On: 07/25/2020 18:29    Scheduled Meds: . amLODipine  5 mg Oral q morning - 10a  . apixaban  5 mg Oral BID  . atorvastatin  10 mg Oral Daily  . Chlorhexidine Gluconate Cloth  6 each Topical Daily  . docusate sodium  100 mg Oral BID  . feeding supplement  237 mL Oral TID BM  . ipratropium-albuterol  3 mL Nebulization Q6H  . lisinopril  40 mg Oral q morning - 10a  . metoprolol tartrate  50 mg Oral BID  . multivitamin with minerals  1 tablet Oral Daily  . potassium chloride  20 mEq Oral BID  . senna  1 tablet Oral BID  . tamsulosin  0.4 mg Oral QPC breakfast  . traMADol  50 mg Oral Q6H  . zinc sulfate  220 mg Oral Daily    Assessment/Plan:  1. Closed right femoral neck fracture requiring operative  repair.  Patient does have a little pulling in his right hip.  As needed pain medications and convert to Tylenol as quickly as possible.  Will need rehab once bed available. 2. 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 History of stroke on Eliquis 4. Hyperlipidemia unspecified on Lipitor 5. History of renal cell cancer and melanoma.  Patient has metastases to the right lung and did receive past of radiation treatment for this. 6. Foley catheter will have to stay in for 1 week as per urology.  Started Flomax 7. Patient able to come off oxygen this morning.        Code Status:     Code Status Orders  (From admission, onward)         Start     Ordered   07/23/20 1436  Do not attempt resuscitation (DNR)  Continuous        07/23/20 1435        Code Status History    Date Active Date Inactive Code Status Order ID Comments User Context   07/23/2020 1141 07/23/2020 1435 Full Code [id number]  Ivor Costa, MD ED   07/23/2020 0901 07/23/2020 1141 DNR [id number]  Ivor Costa, MD ED   Advance Care Planning Activity     Family Communication: Spoke with son on the phone Disposition Plan: Status is: Inpatient  Dispo: The patient is from: Home              Anticipated d/c is to: Rehab              Anticipated d/c date is: Most likely 07/29/2020.  Unless we hear about bed offer and able to get out tomorrow.              Patient currently doing well postoperative day 2 for hip fracture  Time spent: 27 minutes  [place]

## 2020-07-27 NOTE — Progress Notes (Signed)
Physical Therapy Treatment Patient Details Name: [name] [name] MRN: [id number] DOB: 1931-09-11 Today's Date: 07/27/2020    History of Present Illness Pt is an 87 y.o. 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  Pt diagnosed with Right Displaced Femoral Neck Hip Fracture with metastatic lesion in the proximal femoral metaphysis and is s/p R hip hemiarthroplasty.    PT Comments    Pt received sitting upright in bedside chair, attempting to shift his weight d/t buttock pain. Pt states he has been upright in chair >3 hours. Upon entry R hip IR noted past neutral with pt reporting pain. RN notified. Pt requires increased assistance for bed<>chair stand pivot transfer. He also requires increased assistance to return to supine. Once positioned in bed, leg length checked with no discrepancy and R hip palpated with edema noted but no significant bony prominences. Current POC remains appropriate. PT will follow up for b.i.d. session for ROM/strengthening exercises.     Follow Up Recommendations  SNF;Supervision for mobility/OOB     Equipment Recommendations  3in1 (PT)    Recommendations for Other Services       Precautions / Restrictions Precautions Precautions: Posterior Hip Precaution Booklet Issued: No Restrictions Weight Bearing Restrictions: Yes RLE Weight Bearing: Weight bearing as tolerated    Mobility  Bed Mobility Overal bed mobility: Needs Assistance Bed Mobility: Sit to Supine       Sit to supine: Max assist   General bed mobility comments: Pt with increased fatigue. Requires assistance for BLE management and trunk  control.  Transfers Overall transfer level: Needs assistance Equipment used: 2 person hand held assist Transfers: Stand Pivot Transfers Sit to Stand: Min assist Stand pivot transfers: +2 physical assistance;Mod assist       General transfer comment: Pt requiring cues for hand placement and for head hipss relationship.  Ambulation/Gait                 Stairs             Wheelchair Mobility    Modified Rankin (Stroke Patients Only)       Balance Overall balance assessment: Needs assistance Sitting-balance support: Feet unsupported;Bilateral upper extremity supported Sitting balance-Leahy Scale: Fair                                      Cognition Arousal/Alertness: Awake/alert Behavior During Therapy: WFL for tasks assessed/performed Overall Cognitive Status: No family/caregiver present to determine baseline cognitive functioning                                        Exercises      General Comments        Pertinent Vitals/Pain Faces Pain Scale: Hurts whole lot Pain Location: R hip and tail bone Pain Descriptors / Indicators: Aching;Sore Pain Intervention(s): Limited activity within patient's tolerance;Repositioned;Monitored during session    Home Living                      Prior Function  PT Goals (current goals can now be found in the care plan section) Acute Rehab PT Goals Patient Stated Goal: To get back to walking again PT Goal Formulation: With patient Time For Goal Achievement: 08/08/20 Potential to Achieve Goals: Fair Progress towards PT goals: Progressing toward goals    Frequency    BID      PT Plan Current plan remains appropriate    Co-evaluation              AM-PAC PT "6 Clicks" Mobility   Outcome Measure  Help needed turning from your back to your side while in a flat bed without using bedrails?: A Lot Help needed moving from lying on your back to sitting  on the side of a flat bed without using bedrails?: A Lot Help needed moving to and from a bed to a chair (including a wheelchair)?: A Lot Help needed standing up from a chair using your arms (e.g., wheelchair or bedside chair)?: A Lot Help needed to walk in hospital room?: Total Help needed climbing 3-5 steps with a railing? : Total 6 Click Score: 10    End of Session Equipment Utilized During Treatment: Gait belt Activity Tolerance: Patient limited by pain Patient left: in bed;with call bell/phone within reach;with SCD's reapplied;with nursing/sitter in room Nurse Communication: Mobility status;Weight bearing status;Precautions PT Visit Diagnosis: Unsteadiness on feet (R26.81);Repeated falls (R29.6);Other abnormalities of gait and mobility (R26.89);Muscle weakness (generalized) (M62.81)     Time: 2202-5427 PT Time Calculation (min) (ACUTE ONLY): 31 min  Charges:  $Therapeutic Activity: 23-37 mins                     12:37 PM, 07/27/20 Angi Goodell A. Saverio Danker PT, DPT Physical Therapist - [facility] A Jackey Housey 07/27/2020, 12:33 PM

## 2020-07-27 NOTE — Progress Notes (Signed)
Physical Therapy Treatment Patient Details Name: [name] [name] MRN: [id number] DOB: 1931-12-11 Today's Date: 07/27/2020    History of Present Illness Pt is an 87 y.o. 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  Pt diagnosed with Right Displaced Femoral Neck Hip Fracture with metastatic lesion in the proximal femoral metaphysis and is s/p R hip hemiarthroplasty.    PT Comments    Pt's son is present for this session and is able to report that pt presented with limited R knee ROM prior to this admission d/t previous R TKA. This session HEP is reviewed and portions of the program are completed. The pt is able to recall 2/3 total hip precautions with minimal cueing. This session it is noted that the pt presents with limited quadricep activations during SAQ and has limited TKE with knee is ranged passively. He reports inconsistently regarding sensation of his R leg this session. POC continues to be appropriate.    Follow Up Recommendations  SNF;Supervision for mobility/OOB     Equipment Recommendations  3in1 (PT)    Recommendations for Other Services       Precautions / Restrictions Precautions Precautions: Posterior Hip Precaution Booklet Issued: No Restrictions Weight Bearing Restrictions: Yes RLE Weight Bearing: Weight bearing as tolerated    Mobility  Bed Mobility Overal bed mobility: Needs Assistance Bed Mobility: Sit to Supine       Sit to supine: Max assist   General bed mobility comments: Pt with increased fatigue. Requires assistance for BLE management and trunk control.  Transfers Overall transfer level: Needs assistance Equipment  used: 2 person hand held assist Transfers: Stand Pivot Transfers Sit to Stand: Min assist Stand pivot transfers: +2 physical assistance;Mod assist       General transfer comment: Pt requiring cues for hand placement and for head hipss relationship.  Ambulation/Gait                 Stairs             Wheelchair Mobility    Modified Rankin (Stroke Patients Only)       Balance Overall balance assessment: Needs assistance Sitting-balance support: Feet unsupported;Bilateral upper extremity supported Sitting balance-Leahy Scale: Fair                                      Cognition Arousal/Alertness: Awake/alert Behavior During Therapy: WFL for tasks assessed/performed Overall Cognitive Status: Within Functional Limits for tasks assessed                                 General Comments: Pt is HOH and without this earing aid per son      Exercises Total Joint Exercises Ankle Circles/Pumps: AROM;Both (Pt educated to complete every hour) Gluteal Sets: Strengthening;10 reps;Supine;Both Short Arc Quad: AAROM;10 reps;Right;Limitations Short Arc Quad Limitations: Pt presenting with limited quad contraction/activation. He also presents with limited TKE which son and pt state is normal following R TKA Heel Slides: 10 reps;AROM;Supine;Limitations;Right Heel Slides Limitations: pain; pt with limited R knee flexion at baseline following  TKA Hip ABduction/ADduction: 15 reps;AAROM;Right;Supine    General Comments        Pertinent Vitals/Pain Faces Pain Scale: Hurts little more Pain Location: R hip and tail bone Pain Descriptors / Indicators: Aching;Sore Pain Intervention(s): Heat applied;Monitored during session;Premedicated before session (Pt presenting with muscle spasm. Heat pack applied with instruction to son to remove at 20 min and to apply ice pack x20 min following.)    Home Living                      Prior Function             PT Goals (current goals can now be found in the care plan section) Acute Rehab PT Goals Patient Stated Goal: To get back to walking again PT Goal Formulation: With patient Time For Goal Achievement: 08/08/20 Potential to Achieve Goals: Fair Progress towards PT goals: Progressing toward goals    Frequency    BID      PT Plan Current plan remains appropriate    Co-evaluation              AM-PAC PT "6 Clicks" Mobility   Outcome Measure  Help needed turning from your back to your side while in a flat bed without using bedrails?: A Lot Help needed moving from lying on your back to sitting on the side of a flat bed without using bedrails?: A Lot Help needed moving to and from a bed to a chair (including a wheelchair)?: A Lot Help needed standing up from a chair using your arms (e.g., wheelchair or bedside chair)?: A Lot Help needed to walk in hospital room?: Total Help needed climbing 3-5 steps with a railing? : Total 6 Click Score: 10    End of Session Equipment Utilized During Treatment: Gait belt Activity Tolerance: Patient limited by pain Patient left: in bed;with call bell/phone within reach;with SCD's reapplied;with family/visitor present Nurse Communication: Mobility status;Weight bearing status;Precautions PT Visit Diagnosis: Unsteadiness on feet (R26.81);Repeated falls (R29.6);Other abnormalities of gait and mobility (R26.89);Muscle weakness (generalized) (M62.81)     Time: 8828-0034 PT Time Calculation (min) (ACUTE ONLY): 23 min  Charges:  $Therapeutic Exercise: 23-37 mins $Therapeutic Activity: 23-37 mins                    3:42 PM, 07/27/20 [name] [name] A. Saverio Danker PT, DPT Physical Therapist - [facility]    [name] [name] A [name] [name] 07/27/2020, 3:39 PM

## 2020-07-28 DIAGNOSIS — I10 Essential (primary) hypertension: Secondary | ICD-10-CM

## 2020-07-28 DIAGNOSIS — S72001A Fracture of unspecified part of neck of right femur, initial encounter for closed fracture: Secondary | ICD-10-CM | POA: Diagnosis not present

## 2020-07-28 DIAGNOSIS — Z8673 Personal history of transient ischemic attack (TIA), and cerebral infarction without residual deficits: Secondary | ICD-10-CM | POA: Diagnosis not present

## 2020-07-28 DIAGNOSIS — I513 Intracardiac thrombosis, not elsewhere classified: Secondary | ICD-10-CM | POA: Diagnosis not present

## 2020-07-28 DIAGNOSIS — E785 Hyperlipidemia, unspecified: Secondary | ICD-10-CM | POA: Diagnosis not present

## 2020-07-28 LAB — CBC
HCT: 31.7 % — ABNORMAL LOW (ref 39.0–52.0)
Hemoglobin: 10.3 g/dL — ABNORMAL LOW (ref 13.0–17.0)
MCH: 25.2 pg — ABNORMAL LOW (ref 26.0–34.0)
MCHC: 32.5 g/dL (ref 30.0–36.0)
MCV: 77.5 fL — ABNORMAL LOW (ref 80.0–100.0)
Platelets: 238 10*3/uL (ref 150–400)
RBC: 4.09 MIL/uL — ABNORMAL LOW (ref 4.22–5.81)
RDW: 15.5 % (ref 11.5–15.5)
WBC: 10.2 10*3/uL (ref 4.0–10.5)
nRBC: 0 % (ref 0.0–0.2)

## 2020-07-28 LAB — BASIC METABOLIC PANEL
Anion gap: 8 (ref 5–15)
BUN: 21 mg/dL (ref 8–23)
CO2: 28 mmol/L (ref 22–32)
Calcium: 8.2 mg/dL — ABNORMAL LOW (ref 8.9–10.3)
Chloride: 99 mmol/L (ref 98–111)
Creatinine, Ser: 0.53 mg/dL — ABNORMAL LOW (ref 0.61–1.24)
GFR, Estimated: >60 mL/min (ref >60)
Glucose, Bld: 125 mg/dL — ABNORMAL HIGH (ref 70–99)
Potassium: 4.4 mmol/L (ref 3.5–5.1)
Sodium: 135 mmol/L (ref 135–145)

## 2020-07-28 MED ORDER — LACTULOSE 10 GM/15ML PO SOLN: 30.0000 g | Freq: Two times a day (BID) | ORAL | Status: DC | PRN

## 2020-07-28 MED ORDER — FLEET ENEMA 7-19 GM/118ML RE ENEM
1.0000 | ENEMA | Freq: Once | RECTAL | Status: AC
  Administered 2020-07-28: 1 via RECTAL

## 2020-07-28 MED ORDER — TRAMADOL HCL 50 MG PO TABS
50.0000 mg | ORAL_TABLET | Freq: Four times a day (QID) | ORAL | Status: DC | PRN
  Administered 2020-07-29 (×2): 50 mg via ORAL
  Filled 2020-07-28 (×2): qty 1

## 2020-07-28 MED ORDER — LACTULOSE 10 GM/15ML PO SOLN
30.0000 g | Freq: Two times a day (BID) | ORAL | Status: DC
  Administered 2020-07-28 (×2): 30 g via ORAL
  Filled 2020-07-28 (×2): qty 60

## 2020-07-28 NOTE — Progress Notes (Signed)
Physical Therapy Treatment Patient Details Name: [name] [name] MRN: [id number] DOB: Mar 01, 1932 Today's Date: 07/28/2020    History of Present Illness Pt is an 87 y.o. 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  Pt diagnosed with Right Displaced Femoral Neck Hip Fracture with metastatic lesion in the proximal femoral metaphysis and is s/p R hip hemiarthroplasty.    PT Comments    Participated in exercises as described below.  To EOB with mod a x 1.  Stood with mod a x 2 to walker and very slow shuffle steps to recliner with encouragement.  No true steps taken.  Remained in recliner with breakfast/needs met.  Pleased to be up for meal today.   Follow Up Recommendations  SNF;Supervision for mobility/OOB     Equipment Recommendations  3in1 (PT)    Recommendations for Other Services       Precautions / Restrictions Precautions Precautions: Posterior Hip Precaution Booklet Issued: No Restrictions Weight Bearing Restrictions: Yes RLE Weight Bearing: Weight bearing as tolerated    Mobility  Bed Mobility Overal bed mobility: Needs Assistance Bed Mobility: Supine to Sit     Supine to sit: Mod assist        Transfers Overall transfer level: Needs assistance Equipment used: Rolling walker (2 wheeled) Transfers: Sit to/from Stand Sit to Stand: Mod assist;+2 physical assistance Stand pivot transfers: +2 physical assistance;Mod assist       General transfer comment: increased time and no true steps but shuffles to chair  Ambulation/Gait Ambulation/Gait assistance: Mod assist;+2 physical assistance Gait Distance (Feet): 2 Feet   Gait  Pattern/deviations: Step-to pattern;Decreased stance time - right Gait velocity: decreased   General Gait Details: no true steps but shuffles feet to chair.   Stairs             Wheelchair Mobility    Modified Rankin (Stroke Patients Only)       Balance Overall balance assessment: Needs assistance Sitting-balance support: Feet unsupported;Bilateral upper extremity supported Sitting balance-Leahy Scale: Fair     Standing balance support: Bilateral upper extremity supported;During functional activity Standing balance-Leahy Scale: Poor                              Cognition Arousal/Alertness: Awake/alert Behavior During Therapy: WFL for tasks assessed/performed Overall Cognitive Status: Within Functional Limits for tasks assessed                                        Exercises Total Joint Exercises Ankle Circles/Pumps: AROM;Strengthening;Both;10 reps;15 reps (with manual resistance) Quad Sets: Strengthening;Both;10 reps;5 reps Gluteal Sets: Strengthening;Both;10 reps Towel Squeeze: Strengthening;Both;15 reps;10 reps Short Arc Quad: Strengthening;Both;10 reps;15 reps;AAROM (with manual resistance on the LLE, AAROM on the RLE) Hip ABduction/ADduction: AAROM;Both;10 reps;Strengthening;15 reps (with manual resistance on the LLE) Straight Leg Raises: AAROM;Both;10 reps;5 reps;Strengthening (AAROM on the RLE) Bridges: Strengthening;Both;10 reps;5 reps (pushing from bolster behind knees)    General Comments        Pertinent Vitals/Pain Pain Assessment: Faces Faces Pain Scale: Hurts little more Pain Location: R hip  and tail bone Pain Descriptors / Indicators: Aching;Sore Pain Intervention(s): Limited activity within patient's tolerance;Monitored during session;Repositioned    Home Living                      Prior Function            PT Goals (current goals can now be found in the care plan section) Progress towards PT  goals: Progressing toward goals    Frequency    BID      PT Plan Current plan remains appropriate    Co-evaluation              AM-PAC PT "6 Clicks" Mobility   Outcome Measure  Help needed turning from your back to your side while in a flat bed without using bedrails?: A Lot Help needed moving from lying on your back to sitting on the side of a flat bed without using bedrails?: A Lot Help needed moving to and from a bed to a chair (including a wheelchair)?: A Lot Help needed standing up from a chair using your arms (e.g., wheelchair or bedside chair)?: A Lot Help needed to walk in hospital room?: Total Help needed climbing 3-5 steps with a railing? : Total 6 Click Score: 10    End of Session   Activity Tolerance: Patient limited by pain Patient left: in bed;with call bell/phone within reach;with SCD's reapplied;with family/visitor present   PT Visit Diagnosis: Unsteadiness on feet (R26.81);Repeated falls (R29.6);Other abnormalities of gait and mobility (R26.89);Muscle weakness (generalized) (M62.81)     Time: 9476-5465 PT Time Calculation (min) (ACUTE ONLY): 13 min  Charges:  $Therapeutic Activity: 8-22 mins                    Chesley Noon, PTA 07/28/20, 9:57 AM

## 2020-07-28 NOTE — Progress Notes (Signed)
Patient ID: [name] [name], male   DOB: September 24, 1931, 87 y.o.   MRN: [id number] [facility] PROGRESS NOTE  [name] [name] YQI:[id number] DOB: 1931-09-24 DOA: 07/23/2020 PCP: Dion Body, MD  HPI/Subjective: Patient had a little pain in his hip this morning.  He said he has been up thinking this morning about things.  Offers no other complaints.  Breathing okay.  Objective: Vitals:   07/28/20 0748 07/28/20 0814  BP:  129/67  Pulse: 89 93  Resp: 14 16  Temp:  98.2 F (36.8 C)  SpO2: 93% 93%    Intake/Output Summary (Last 24 hours) at 07/28/2020 1221 Last data filed at 07/28/2020 3875 Gross per 24 hour  Intake --  Output 1350 ml  Net -1350 ml   Filed Weights   07/23/20 0623  Weight: 97.5 kg    ROS: Review of Systems  Respiratory: Negative for shortness of breath.   Cardiovascular: Negative for chest pain.  Gastrointestinal: Negative for abdominal pain.   Exam: Physical Exam HENT:     Head: Normocephalic.     Mouth/Throat:     Pharynx: No oropharyngeal exudate.  Eyes:     General: Lids are normal.     Conjunctiva/sclera: Conjunctivae normal.     Pupils: Pupils are equal, round, and reactive to light.  Cardiovascular:     Rate and Rhythm: Normal rate and regular rhythm.     Heart sounds: Normal heart sounds, S1 normal and S2 normal.  Pulmonary:     Breath sounds: Examination of the right-lower field reveals wheezing. Examination of the left-lower field reveals wheezing. Wheezing present. No decreased breath sounds, rhonchi or rales.  Abdominal:     Palpations: Abdomen is soft.     Tenderness: There is no abdominal tenderness.  Musculoskeletal:     Right lower leg: No swelling.     Left lower leg: No swelling.  Skin:    General: Skin is warm.     Findings: No rash.  Neurological:     Mental Status: He is alert.       Data Reviewed: Basic Metabolic Panel: Recent Labs  Lab 07/23/20 0631 07/24/20 0322 07/25/20 0322 07/26/20 0650 07/28/20 0611  NA 137 138  137 137 135  K 3.5 3.6 3.3* 3.2* 4.4  CL 103 102 98 98 99  CO2 23 28 30 29 28   GLUCOSE 159* 124* 99 158* 125*  BUN 12 14 18 22 21   CREATININE 0.58* 0.58* 0.62 0.62 0.53*  CALCIUM 8.4* 8.5* 8.0* 8.2* 8.2*   Liver Function Tests: Recent Labs  Lab 07/23/20 0631  AST 15  ALT 12  ALKPHOS 88  BILITOT 0.7  PROT 6.8  ALBUMIN 2.9*   CBC: Recent Labs  Lab 07/23/20 0631 07/24/20 0322 07/25/20 0322 07/26/20 0650 07/27/20 0518 07/28/20 0611  WBC 9.5 12.1* 9.6 12.1* 13.3* 10.2  NEUTROABS 8.0*  --   --   --   --   --   HGB 12.1* 11.7* 11.9* 10.7* 10.1* 10.3*  HCT 38.5* 37.2* 36.5* 33.8* 30.9* 31.7*  MCV 77.9* 78.8* 77.3* 78.1* 77.6* 77.5*  PLT 304 326 293 276 271 238   Cardiac Enzymes: Recent Labs  Lab 07/23/20 0631  CKTOTAL 50   BNP (last 3 results) Recent Labs    07/23/20 0631  BNP 71.8      Recent Results (from the past 240 hour(s))  Resp Panel by RT-PCR (Flu A&B, Covid) Nasopharyngeal Swab     Status: None   Collection Time: 07/23/20  6:50 AM   Specimen: Nasopharyngeal Swab; Nasopharyngeal(NP) swabs in vial transport medium  Result Value Ref Range Status   SARS Coronavirus 2 by RT PCR NEGATIVE NEGATIVE Final    Comment: (NOTE) SARS-CoV-2 target nucleic acids are NOT DETECTED.  The SARS-CoV-2 RNA is generally detectable in upper respiratory specimens during the acute phase of infection. The lowest concentration of SARS-CoV-2 viral copies this assay can detect is 138 copies/mL. A negative result does not preclude SARS-Cov-2 infection and should not be used as the sole basis for treatment or other patient management decisions. A negative result may occur with  improper specimen collection/handling, submission of specimen other than nasopharyngeal swab, presence of viral mutation(s) within the areas targeted by this assay, and inadequate number of viral copies(<138 copies/mL). A negative result must be combined with clinical observations, patient history, and  epidemiological information. The expected result is Negative.  Fact Sheet for Patients:  EntrepreneurPulse.com.au  Fact Sheet for Healthcare Providers:  IncredibleEmployment.be  This test is no t yet approved or cleared by the [place] FDA and  has been authorized for detection and/or diagnosis of SARS-CoV-2 by FDA under an Emergency Use Authorization (EUA). This EUA will remain  in effect (meaning this test can be used) for the duration of the COVID-19 declaration under Section 564(b)(1) of the Act, 21 U.S.C.section 360bbb-3(b)(1), unless the authorization is terminated  or revoked sooner.       Influenza A by PCR NEGATIVE NEGATIVE Final   Influenza B by PCR NEGATIVE NEGATIVE Final    Comment: (NOTE) The Xpert Xpress SARS-CoV-2/FLU/RSV plus assay is intended as an aid in the diagnosis of influenza from Nasopharyngeal swab specimens and should not be used as a sole basis for treatment. Nasal washings and aspirates are unacceptable for Xpert Xpress SARS-CoV-2/FLU/RSV testing.  Fact Sheet for Patients: EntrepreneurPulse.com.au  Fact Sheet for Healthcare Providers: IncredibleEmployment.be  This test is not yet approved or cleared by the [place] FDA and has been authorized for detection and/or diagnosis of SARS-CoV-2 by FDA under an Emergency Use Authorization (EUA). This EUA will remain in effect (meaning this test can be used) for the duration of the COVID-19 declaration under Section 564(b)(1) of the Act, 21 U.S.C. section 360bbb-3(b)(1), unless the authorization is terminated or revoked.  Performed at [facility], [address] [place], [place] [address]   Surgical PCR screen     Status: None   Collection Time: 07/24/20 11:12 AM   Specimen: Nasal Mucosa; Nasal Swab  Result Value Ref Range Status   MRSA, PCR NEGATIVE NEGATIVE Final   Staphylococcus aureus NEGATIVE NEGATIVE  Final    Comment: (NOTE) The Xpert SA Assay (FDA approved for NASAL specimens in patients 103 years of age and older), is one component of a comprehensive surveillance program. It is not intended to diagnose infection nor to guide or monitor treatment. Performed at [facility], [address] [place], [place] [address]       Scheduled Meds: . amLODipine  5 mg Oral q morning - 10a  . apixaban  5 mg Oral BID  . atorvastatin  10 mg Oral Daily  . Chlorhexidine Gluconate Cloth  6 each Topical Daily  . docusate sodium  100 mg Oral BID  . feeding supplement  237 mL Oral TID BM  . ipratropium-albuterol  3 mL Nebulization Q6H  . lisinopril  40 mg Oral q morning - 10a  . metoprolol tartrate  50 mg Oral BID  . multivitamin with minerals  1 tablet Oral Daily  . potassium chloride  20 mEq Oral BID  . senna  1 tablet Oral BID  . tamsulosin  0.4 mg Oral QPC breakfast  . traMADol  50 mg Oral Q6H  . zinc sulfate  220 mg Oral Daily    Assessment/Plan:  1. Closed right femoral neck fracture requiring operative repair.  Patient stated he had a little pain in his hip.  And noticed that tramadol was ordered a standing dose will make that as needed.  Try to convert to Tylenol as quickly as possible.  Anticipate out to rehab tomorrow.  Covid swab 6 to 24-hour test ordered today in anticipation of discharge tomorrow. 2. Hyperlipidemia unspecified on Lipitor 3. History of stroke.  On Eliquis 4. History of mural thrombus.  On Eliquis. 5. History of renal cell carcinoma and melanoma.  Patient does have metastases to the right lung and did received prior treatment for this. 6. In the OR they had difficulty placing a Foley catheter and urology had a place and they recommended it stay in for 1 week.  Continue Foley catheter.  Started Flomax. 7. Slight wheeze.  Continue nebulizers. 8. Essential hypertension on metoprolol, amlodipine and lisinopril 9. Patient followed by [facility] hosp[facility] and is a  DNR.        Code Status:     Code Status Orders  (From admission, onward)         Start     Ordered   07/23/20 1436  Do not attempt resuscitation (DNR)  Continuous        07/23/20 1435        Code Status History    Date Active Date Inactive Code Status Order ID Comments User Context   07/23/2020 1141 07/23/2020 1435 Full Code [id number]  Ivor Costa, MD ED   07/23/2020 0901 07/23/2020 1141 DNR [id number]  Ivor Costa, MD ED   Advance Care Planning Activity     Family Communication: Spoke with son on the phone Disposition Plan: Status is: Inpatient  Dispo: The patient is from: Home              Anticipated d/c is to: Rehab              Anticipated d/c date is: Likely out to rehab on 07/29/2020              Patient currently will go out to rehab when bed available.  Time spent: 27 minutes  [place]

## 2020-07-28 NOTE — TOC Progression Note (Signed)
Transition of Care [facility]) - Progression Note    Patient Details  Name: THELMER LEGLER MRN: [id number] Date of Birth: Aug 27, 1931  Transition of [facility]) CM/SW [place], RN Phone Number: 07/28/2020, 2:34 PM  Clinical Narrative:   RNCM reached out to patient's son Jonni Sanger to follow on placement decision, no answer VM was left. RNCM received insurance authorization through [place] portal with reference # [id number] which is good through 1/12. Once facility is chosen authorization details will need to be updated.     Expected Discharge Plan: [place] Barriers to Discharge: No Barriers Identified  Expected Discharge Plan and Services Expected Discharge Plan: [place]   Discharge Planning Services: CM Consult   Living arrangements for the past 2 months: Single Family Home                                       Social Determinants of Health (SDOH) Interventions    Readmission Risk Interventions No flowsheet data found.

## 2020-07-28 NOTE — TOC Progression Note (Signed)
Transition of Care [facility]) - Progression Note    Patient Details  Name: [name] [name] MRN: [id number] Date of Birth: 03-18-1932  [facility] CM/SW [place], RN Phone Number: 07/28/2020, 10:49 AM  Clinical Narrative:   RNCM reached out to patient's son [name] [name] to discuss SNF bed offers that have been made. [name] [name] requested facility list be e-mailed to him which was completed. This RNCM will start insurance authorization and follow up with son this afternoon to follow up on bed choice.     Expected Discharge Plan: [place] Barriers to Discharge: No Barriers Identified  Expected Discharge Plan and Services Expected Discharge Plan: [place]   Discharge Planning Services: CM Consult   Living arrangements for the past 2 months: Single Family Home                                       Social Determinants of Health (SDOH) Interventions    Readmission Risk Interventions No flowsheet data found.

## 2020-07-28 NOTE — Progress Notes (Signed)
Physical Therapy Treatment Patient Details Name: [name] [name] MRN: [id number] DOB: 1932/01/21 Today's Date: 07/28/2020    History of Present Illness Pt is an 87 y.o. 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  Pt diagnosed with Right Displaced Femoral Neck Hip Fracture with metastatic lesion in the proximal femoral metaphysis and is s/p R hip hemiarthroplasty.    PT Comments    Pt in recliner x 4 hours today.  Attempted to stand and transfer to commode due to runny watery stool.  She is unable to stand to transfer to commode x 2 attempts.  Collene Mares lift is used to transfer to commode and back to bed with no further BM. Returned to supine with mod a x 2.   Follow Up Recommendations  SNF;Supervision for mobility/OOB     Equipment Recommendations  3in1 (PT)    Recommendations for Other Services       Precautions / Restrictions Precautions Precautions: Posterior Hip Precaution Booklet Issued: No Restrictions Weight Bearing Restrictions: Yes RLE Weight Bearing: Weight bearing as tolerated    Mobility  Bed Mobility Overal bed mobility: Needs Assistance Bed Mobility: Supine to Sit     Supine to sit: Mod assist Sit to supine: Mod assist;+2 for physical assistance      Transfers Overall transfer level: Needs assistance Equipment used: Rolling walker (2 wheeled) Transfers: Sit to/from Stand Sit to Stand: Mod assist;+2 physical assistance Stand pivot transfers: +2 physical assistance;Mod assist       General transfer comment: unable to stand safetly this pm for transfer, sabina lift used  Ambulation/Gait Ambulation/Gait assistance: Mod  assist;+2 physical assistance Gait Distance (Feet): 2 Feet   Gait Pattern/deviations: Step-to pattern;Decreased stance time - right Gait velocity: decreased   General Gait Details: no true steps but shuffles feet to chair.   Stairs             Wheelchair Mobility    Modified Rankin (Stroke Patients Only)       Balance Overall balance assessment: Needs assistance Sitting-balance support: Feet unsupported;Bilateral upper extremity supported Sitting balance-Leahy Scale: Fair     Standing balance support: Bilateral upper extremity supported;During functional activity Standing balance-Leahy Scale: Zero Standing balance comment: R knee buckled just in standing                            Cognition Arousal/Alertness: Awake/alert Behavior During Therapy: WFL for tasks assessed/performed Overall Cognitive Status: Within Functional Limits for tasks assessed                                        Exercises Total Joint Exercises Ankle Circles/Pumps: AROM;Strengthening;Both;10 reps;15 reps (with manual resistance) Quad Sets: Strengthening;Both;10 reps;5 reps Gluteal Sets: Strengthening;Both;10 reps Towel Squeeze: Strengthening;Both;15 reps;10 reps Short Arc Quad: Strengthening;Both;10 reps;15 reps;AAROM (with manual resistance on the LLE, AAROM on the RLE) Hip ABduction/ADduction: AAROM;Both;10 reps;Strengthening;15 reps (with manual resistance on the LLE) Straight Leg Raises: AAROM;Both;10 reps;5 reps;Strengthening (AAROM on the RLE) Bridges: Strengthening;Both;10 reps;5 reps (pushing from bolster behind knees)    General Comments  Pertinent Vitals/Pain Pain Assessment: Faces Faces Pain Scale: Hurts little more Pain Location: R hip and tail bone Pain Descriptors / Indicators: Aching;Sore Pain Intervention(s): Limited activity within patient's tolerance;Monitored during session;Repositioned    Home Living                       Prior Function            PT Goals (current goals can now be found in the care plan section) Progress towards PT goals: Progressing toward goals    Frequency    BID      PT Plan Current plan remains appropriate    Co-evaluation              AM-PAC PT "6 Clicks" Mobility   Outcome Measure  Help needed turning from your back to your side while in a flat bed without using bedrails?: A Lot Help needed moving from lying on your back to sitting on the side of a flat bed without using bedrails?: A Lot Help needed moving to and from a bed to a chair (including a wheelchair)?: Total Help needed standing up from a chair using your arms (e.g., wheelchair or bedside chair)?: Total Help needed to walk in hospital room?: Total Help needed climbing 3-5 steps with a railing? : Total 6 Click Score: 8    End of Session Equipment Utilized During Treatment: Gait belt Activity Tolerance: Patient limited by pain Patient left: in bed;with call bell/phone within reach;with SCD's reapplied;with family/visitor present Nurse Communication: Mobility status;Weight bearing status;Precautions PT Visit Diagnosis: Unsteadiness on feet (R26.81);Repeated falls (R29.6);Other abnormalities of gait and mobility (R26.89);Muscle weakness (generalized) (M62.81)     Time: 1696-7893 PT Time Calculation (min) (ACUTE ONLY): 25 min  Charges:  $Therapeutic Activity: 8-22 mins                    Chesley Noon, PTA 07/28/20, 1:38 PM

## 2020-07-28 NOTE — Progress Notes (Signed)
Subjective: 3 Days Post-Op Procedure(s) (LRB): ARTHROPLASTY BIPOLAR HIP (HEMIARTHROPLASTY) (Right)   Patient continues to do well.  Still waiting for skilled nursing placement.  Pain is better.  Is walked with PT a little.    Patient reports pain as mild.  Objective:   VITALS:   Vitals:   07/28/20 0814 07/28/20 1532  BP: 129/67 134/67  Pulse: 93 81  Resp: 16 18  Temp: 98.2 F (36.8 C) 98.8 F (37.1 C)  SpO2: 93% 93%    Neurologically intact Sensation intact distally Dorsiflexion/Plantar flexion intact Incision: dressing C/D/I  LABS Recent Labs    07/26/20 0650 07/27/20 0518 07/28/20 0611  HGB 10.7* 10.1* 10.3*  HCT 33.8* 30.9* 31.7*  WBC 12.1* 13.3* 10.2  PLT 276 271 238    Recent Labs    07/26/20 0650 07/28/20 0611  NA 137 135  K 3.2* 4.4  BUN 22 21  CREATININE 0.62 0.53*  GLUCOSE 158* 125*    No results for input(s): LABPT, INR in the last 72 hours.   Assessment/Plan: 3 Days Post-Op Procedure(s) (LRB): ARTHROPLASTY BIPOLAR HIP (HEMIARTHROPLASTY) (Right)   Advance diet Up with therapy Discharge to SNF when available

## 2020-07-28 NOTE — Progress Notes (Signed)
Noticed dried blood around meatus area and also to foley catheter tube. Cleanse meatus and cath tub. Pt unable to state the cause of bleeding.  Will monitor.

## 2020-07-29 DIAGNOSIS — Z8673 Personal history of transient ischemic attack (TIA), and cerebral infarction without residual deficits: Secondary | ICD-10-CM | POA: Diagnosis not present

## 2020-07-29 DIAGNOSIS — R062 Wheezing: Secondary | ICD-10-CM | POA: Diagnosis not present

## 2020-07-29 DIAGNOSIS — Z85528 Personal history of other malignant neoplasm of kidney: Secondary | ICD-10-CM | POA: Diagnosis not present

## 2020-07-29 DIAGNOSIS — S72001A Fracture of unspecified part of neck of right femur, initial encounter for closed fracture: Secondary | ICD-10-CM | POA: Diagnosis not present

## 2020-07-29 LAB — SARS CORONAVIRUS 2 (TAT 6-24 HRS): SARS Coronavirus 2: NEGATIVE

## 2020-07-29 MED ORDER — ACETAMINOPHEN 325 MG PO TABS: 325.0000 mg | ORAL_TABLET | Freq: Four times a day (QID) | ORAL | Status: DC | PRN

## 2020-07-29 MED ORDER — MOMETASONE FURO-FORMOTEROL FUM 100-5 MCG/ACT IN AERO
2.0000 | INHALATION_SPRAY | Freq: Two times a day (BID) | RESPIRATORY_TRACT | Status: DC
  Administered 2020-07-29 – 2020-07-30 (×3): 2 via RESPIRATORY_TRACT
  Filled 2020-07-29: qty 8.8

## 2020-07-29 MED ORDER — NYSTATIN 100000 UNIT/ML MT SUSP: 5.0000 mL | Freq: Four times a day (QID) | OROMUCOSAL | 0 refills | Status: AC

## 2020-07-29 MED ORDER — IPRATROPIUM-ALBUTEROL 0.5-2.5 (3) MG/3ML IN SOLN: 3.0000 mL | Freq: Four times a day (QID) | RESPIRATORY_TRACT | 0 refills | Status: DC

## 2020-07-29 MED ORDER — ENSURE ENLIVE PO LIQD: 237.0000 mL | Freq: Three times a day (TID) | ORAL | 0 refills | Status: AC

## 2020-07-29 MED ORDER — ADULT MULTIVITAMIN W/MINERALS CH: 1.0000 | ORAL_TABLET | Freq: Every day | ORAL | 0 refills | Status: AC

## 2020-07-29 MED ORDER — DOCUSATE SODIUM 100 MG PO CAPS: 100.0000 mg | ORAL_CAPSULE | Freq: Two times a day (BID) | ORAL | 0 refills | Status: AC

## 2020-07-29 MED ORDER — POLYETHYLENE GLYCOL 3350 17 G PO PACK: 17.0000 g | PACK | Freq: Every day | ORAL | 0 refills | Status: AC | PRN

## 2020-07-29 MED ORDER — TAMSULOSIN HCL 0.4 MG PO CAPS: 0.4000 mg | ORAL_CAPSULE | Freq: Every day | ORAL | 0 refills | Status: DC

## 2020-07-29 MED ORDER — HYDROCODONE-ACETAMINOPHEN 5-325 MG PO TABS: 1.0000 | ORAL_TABLET | Freq: Four times a day (QID) | ORAL | 0 refills | Status: DC | PRN

## 2020-07-29 MED ORDER — MOMETASONE FURO-FORMOTEROL FUM 100-5 MCG/ACT IN AERO: 2.0000 | INHALATION_SPRAY | Freq: Two times a day (BID) | RESPIRATORY_TRACT | 0 refills | Status: DC

## 2020-07-29 MED ORDER — NYSTATIN 100000 UNIT/ML MT SUSP
5.0000 mL | Freq: Four times a day (QID) | OROMUCOSAL | Status: DC
  Administered 2020-07-29 – 2020-07-30 (×5): 500000 [IU] via ORAL
  Filled 2020-07-29 (×4): qty 5

## 2020-07-29 NOTE — TOC Progression Note (Addendum)
Transition of Care [facility]) - Progression Note    Patient Details  Name: [name] [name] MRN: [id number] Date of Birth: Jul 23, 1931  Transition of Care [facility]) CM/SW [place], LCSW Phone Number: 07/29/2020, 9:31 AM  Clinical Narrative:   CSW called patient's son to follow up if he has chosen a SNF based on bed offers presented. He reported he does not really want patient go to to either of the bed offers available, asked if CSW can extend bed search. Bed search extended. Son understands he needs to make a decision on SNF today.  12:10- Called son with additional bed offers. Sent list via email. Son understands patient is medically ready so he would need to choose today so we can see if chosen SNF has a bed available for DC today.  1:30- Son chose [facility]. CSW left voicemail for Admissions Lexine Baton to see if they have a bed available today.  2:30- Attempted call to [place] at [facility]. No beds today, would have a bed tomorrow. Updated MD and patient's son.   Terry to update SNF for insurance authorization, spoke with [employer]. She reported will have to re start auth with new SNF. Restarted auth. Reference # [id number] valid through 1/13  4:00- [facility] Representative Kieth Brightly aware of plan to DC to [facility]. Kieth Brightly will meet with son in the morning to complete revocation paperwork for hospice.    Expected Discharge Plan: [place] Barriers to Discharge: No Barriers Identified  Expected Discharge Plan and Services Expected Discharge Plan: [place]   Discharge Planning Services: CM Consult   Living arrangements for the past 2 months: Single Family Home Expected Discharge Date: 07/29/20                                     Social Determinants of Health (SDOH) Interventions    Readmission Risk Interventions No flowsheet data found.

## 2020-07-29 NOTE — Progress Notes (Signed)
  Subjective:  POD #4 s/p cemented right hip hemiarthroplasty.   Patient reports right hip pain as mild.  Patient demonstrates confusion today.  His son is at the bedside.  Objective:   VITALS:   Vitals:   07/28/20 2347 07/29/20 0450 07/29/20 0745 07/29/20 1121  BP: 112/64 129/75 129/77 105/64  Pulse: (!) 107 87 85 70  Resp:  18 18 16   Temp:  98.8 F (37.1 C) 98.1 F (36.7 C) 98.2 F (36.8 C)  TempSrc:  Oral Oral Oral  SpO2: 91% 94% 94% 94%  Weight:      Height:        PHYSICAL EXAM: Right lower extremity: Neurovascular intact Sensation intact distally Intact pulses distally Dorsiflexion/Plantar flexion intact Honeycomb dressing: moderate serosanguineous drainage No cellulitis present Compartment soft  LABS  No results found for this or any previous visit (from the past 24 hour(s)).  No results found.  Assessment/Plan: 4 Days Post-Op   Principal Problem:   Closed displaced fracture of right femoral neck (HCC) Active Problems:   Essential hypertension   History of stroke   Pure hypercholesterolemia   Fall   CAD (coronary artery disease)   Mural thrombus of heart   Nausea and vomiting   Hyperlipidemia   Hx of renal cell cancer   Wheeze   Malignant neoplasm metastatic to right lung [facility])   The patient is stable from an orthopedic standpoint.  He is weightbearing as tolerated on the right lower extremity.  He will continue to observe posterior hip precautions.  Patient awaiting skilled nursing facility placement.  The son indicated he would ideally like the patient to go to a facility in [facility] close to where the son lives.  Patient should continue Eliquis 5 mg p.o. twice daily which is his baseline medication for atrial fibrillation.  He will follow-up in my office in 2 weeks.  He is weightbearing as tolerated on the right lower extremity and should continue physical therapy for gait training, lower extremity strengthening and hip range of motion while  at his skilled nursing facility.    Thornton Park , MD 07/29/2020, 1:48 PM

## 2020-07-29 NOTE — Progress Notes (Signed)
Pt not able to tolerate turning side to side when nursing staff changed chuck pads due to incontinence episode this morning. Pt also refused PRN pain medication when offered. Nursing staff encouraged pt to turn in order to properly clean sacral area.

## 2020-07-29 NOTE — Progress Notes (Signed)
Physical Therapy Treatment Patient Details Name: [name] [name] MRN: [id number] DOB: 12/12/1931 Today's Date: 07/29/2020    History of Present Illness Pt is an 87 y.o. 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  Pt diagnosed with Right Displaced Femoral Neck Hip Fracture with metastatic lesion in the proximal femoral metaphysis and is s/p R hip hemiarthroplasty.    PT Comments    Multiple sessions this am.   9:07-9:20 Participated in exercises as described below.  To EOB and transferred to recliner at bedside with mod a x 2.   9:54-9:58  Assisted tech to transfer to commode with mod a x 2.  11:58-12:05 Returned to assist pt to bed.  Stood with RW and mod a x 2 but unable to step or shuffle to right.  Returned to sitting and chair moved so he was able to transfer with mod a x 2 towards left.  Back to supine with mod/max x 2  Pt continues with poor ability to step and transition from surfaces.  RLE continues to buckle with attempts at stepping.    Initially pt declined pain meds this am.  He is HOH and makes communication difficulty at times.  LPN notified that he did in fact want pain meds.     Follow Up Recommendations  SNF;Supervision for mobility/OOB     Equipment Recommendations  3in1 (PT)    Recommendations for Other Services       Precautions / Restrictions Precautions Precautions: Posterior Hip Precaution Booklet Issued: No Restrictions Weight Bearing Restrictions: Yes RLE Weight Bearing: Weight bearing as tolerated    Mobility  Bed Mobility Overal bed mobility: Needs Assistance Bed Mobility: Supine to Sit;Sit to Supine      Supine to sit: Mod assist Sit to supine: Mod assist;+2 for physical assistance      Transfers Overall transfer level: Needs assistance Equipment used: Rolling walker (2 wheeled) Transfers: Sit to/from Stand Sit to Stand: Mod assist;+2 physical assistance Stand pivot transfers: +2 physical assistance;Mod assist          Ambulation/Gait Ambulation/Gait assistance: Mod assist;+2 physical assistance Gait Distance (Feet): 2 Feet Assistive device: Rolling walker (2 wheeled) Gait Pattern/deviations: Step-to pattern Gait velocity: decreased   General Gait Details: no true steps but shuffles feet to chair.   Stairs             Wheelchair Mobility    Modified Rankin (Stroke Patients Only)       Balance Overall balance assessment: Needs assistance Sitting-balance support: Feet unsupported;Bilateral upper extremity supported Sitting balance-Leahy Scale: Fair     Standing balance support: Bilateral upper extremity supported;During functional activity Standing balance-Leahy Scale: Poor Standing balance comment: R knee buckled just in standing                            Cognition Arousal/Alertness: Awake/alert Behavior During Therapy: WFL for tasks assessed/performed Overall Cognitive Status: Within Functional Limits for tasks assessed                                 General Comments: HOH which can  limit understanding at times      Exercises Total Joint Exercises Ankle Circles/Pumps: AROM;Strengthening;Both;10 reps;15 reps (with manual resistance) Quad Sets: Strengthening;Both;10 reps;5 reps Gluteal Sets: Strengthening;Both;10 reps Towel Squeeze: Strengthening;Both;15 reps;10 reps Short Arc Quad: Strengthening;Both;10 reps;15 reps;AAROM (with manual resistance on the LLE, AAROM on the RLE) Hip ABduction/ADduction: AAROM;Both;10 reps;Strengthening;15 reps (with manual resistance on the LLE) Straight Leg Raises: AAROM;Both;10 reps;5  reps;Strengthening (AAROM on the RLE) Bridges: Strengthening;Both;10 reps;5 reps (pushing from bolster behind knees)    General Comments        Pertinent Vitals/Pain Pain Assessment: Faces Faces Pain Scale: Hurts little more Pain Location: R hip and tail bone Pain Descriptors / Indicators: Aching;Sore Pain Intervention(s): Limited activity within patient's tolerance;Monitored during session;Repositioned;Patient requesting pain meds-RN notified    Home Living                      Prior Function            PT Goals (current goals can now be found in the care plan section) Progress towards PT goals: Progressing toward goals    Frequency    BID      PT Plan Current plan remains appropriate    Co-evaluation              AM-PAC PT "6 Clicks" Mobility   Outcome Measure  Help needed turning from your back to your side while in a flat bed without using bedrails?: A Lot Help needed moving from lying on your back to sitting on the side of a flat bed without using bedrails?: A Lot Help needed moving to and from a bed to a chair (including a wheelchair)?: A Lot Help needed standing up from a chair using your arms (e.g., wheelchair or bedside chair)?: A Lot Help needed to walk in hospital room?: Total Help needed climbing 3-5 steps with a railing? : Total 6 Click Score: 10    End of Session Equipment Utilized During Treatment: Gait belt Activity Tolerance: Patient limited by pain Patient left: in bed;with call bell/phone within reach;with SCD's reapplied;with family/visitor present Nurse Communication: Mobility status;Weight bearing status;Precautions       Time: 1610-9604 PT Time Calculation (min) (ACUTE ONLY): 24 min  Charges:  $Therapeutic Exercise: 8-22 mins $Therapeutic Activity: 8-22 mins                    Chesley Noon, PTA 07/29/20, 12:58 PM

## 2020-07-29 NOTE — Care Management Important Message (Signed)
Important Message  Patient Details  Name: [name] [name] MRN: [id number] Date of Birth: August 01, 1931   Medicare Important Message Given:  Yes     Dannette Barbara 07/29/2020, 11:33 AM

## 2020-07-29 NOTE — Discharge Summary (Addendum)
[facility] NAME: [name] [name]    MR#:  [id number]  DATE OF BIRTH:  11-Oct-1931  DATE OF ADMISSION:  07/23/2020 ADMITTING PHYSICIAN: Ivor Costa, MD  DATE OF DISCHARGE: 07/30/2020  PRIMARY CARE PHYSICIAN: Dion Body, MD    ADMISSION DIAGNOSIS:  Fall, initial encounter [W19.XXXA] Acute hip pain, right [M25.551] Closed displaced fracture of right femoral neck ([place]) [S72.001A]  DISCHARGE DIAGNOSIS:  Principal Problem:   Closed displaced fracture of right femoral neck (HCC) Active Problems:   Essential hypertension   History of stroke   Pure hypercholesterolemia   Fall   CAD (coronary artery disease)   Mural thrombus of heart   Nausea and vomiting   Hyperlipidemia   Hx of renal cell cancer   Wheeze   Malignant neoplasm metastatic to right lung ([place])   SECONDARY DIAGNOSIS:   Past Medical History:  Diagnosis Date  . Arthritis   . Cancer ([place]) 45 years ago melanoma  . Enlarged prostate 11/21/14  . GERD (gastroesophageal reflux disease)   . History of kidney stones   . Hypertension   . Renal cancer ([place])   . Stroke [facility]) [id number]   slight weakness RT side    HOSPITAL COURSE:   1.  Closed right femoral neck fracture requiring operative repair.  Patient is postop day five from right hip hemiarthroplasty.  Activity as tolerated as per orthopedic surgeon.  Patient stated that he only has slight discomfort.  As needed pain medication.  Try to convert to Tylenol as quickly as possible.  Follow-up with orthopedics 2 weeks.  Dressing changes daily.  Physical therapy at rehab. 2.  Patient has intermittent wheeze during the hospitalization.  Continue nebulizer treatments.  Continue Dulera inhaler.  Trying to hold off on steroids 3.  Hyperlipidemia unspecified.  Continue Lipitor 4.  History of nonhemorrhagic stroke and mural thrombus of the heart on Eliquis 5.  History of renal cell carcinoma and melanoma.  Patient does have  metastases to the right lung and did receive prior treatment for this. 6.  In the operating room they had a difficulty placing the Foley and urology had to place.  They recommended it staying in for 1 week.  I started Flomax.  Urology will do a voiding trial. 7.  Essential hypertension on metoprolol, amlodipine and lisinopril 8.  The patient is followed by [facility] and is a DNR. 9.  Thrush on tongue we will give nystatin swish and swallow.  DISCHARGE CONDITIONS:  Satisfactory  CONSULTS OBTAINED:  Treatment Team:  Thornton Park, MD Billey Co, MD  DRUG ALLERGIES:  No Known Allergies  DISCHARGE MEDICATIONS:   Allergies as of 07/29/2020   No Known Allergies     Medication List    STOP taking these medications   meloxicam 15 MG tablet Commonly known as: MOBIC     TAKE these medications   acetaminophen 325 MG tablet Commonly known as: TYLENOL Take 1-2 tablets (325-650 mg total) by mouth every 6 (six) hours as needed for mild pain (pain score 1-3 or temp > 100.5).   amLODipine 5 MG tablet Commonly known as: NORVASC Take 5 mg by mouth every morning.   apixaban 5 MG Tabs tablet Commonly known as: ELIQUIS Take 5 mg by mouth 2 (two) times daily.   atorvastatin 10 MG tablet Commonly known as: LIPITOR Take 10 mg by mouth daily.   beta carotene 25000 UNIT capsule Take 25,000 Units by mouth daily.   docusate  sodium 100 MG capsule Commonly known as: COLACE Take 1 capsule (100 mg total) by mouth 2 (two) times daily.   feeding supplement Liqd Take 237 mLs by mouth 3 (three) times daily between meals.   HYDROcodone-acetaminophen 5-325 MG tablet Commonly known as: NORCO/VICODIN Take 1-2 tablets by mouth every 6 (six) hours as needed for moderate pain or severe pain.   ipratropium-albuterol 0.5-2.5 (3) MG/3ML Soln Commonly known as: DUONEB Take 3 mLs by nebulization every 6 (six) hours.   lisinopril 40 MG tablet Commonly known as: ZESTRIL Take 40 mg by  mouth every morning.   metoprolol tartrate 50 MG tablet Commonly known as: LOPRESSOR Take 50 mg by mouth 2 (two) times daily.   mometasone-formoterol 100-5 MCG/ACT Aero Commonly known as: DULERA Inhale 2 puffs into the lungs 2 (two) times daily.   multivitamin with minerals Tabs tablet Take 1 tablet by mouth daily.   nystatin 100000 UNIT/ML suspension Commonly known as: MYCOSTATIN Take 5 mLs (500,000 Units total) by mouth 4 (four) times daily for 10 days.   polyethylene glycol 17 g packet Commonly known as: MIRALAX / GLYCOLAX Take 17 g by mouth daily as needed for mild constipation.   potassium chloride SA 20 MEQ tablet Commonly known as: KLOR-CON Take 20 mEq by mouth 2 (two) times daily.   tamsulosin 0.4 MG Caps capsule Commonly known as: FLOMAX Take 1 capsule (0.4 mg total) by mouth daily after breakfast.   Zinc 50 MG Tabs Take 50 mg by mouth daily.        DISCHARGE INSTRUCTIONS:   Follow-up doctor at rehab 1 day Follow-up urology in a few days for voiding trial Follow-up Dr. Mack Guise 2 weeks  If you experience worsening of your admission symptoms, develop shortness of breath, life threatening emergency, suicidal or homicidal thoughts you must seek medical attention immediately by calling 911 or calling your MD immediately  if symptoms less severe.  You Must read complete instructions/literature along with all the possible adverse reactions/side effects for all the Medicines you take and that have been prescribed to you. Take any new Medicines after you have completely understood and accept all the possible adverse reactions/side effects.   Please note  You were cared for by a hospitalist during your hospital stay. If you have any questions about your discharge medications or the care you received while you were in the hospital after you are discharged, you can call the unit and asked to speak with the hospitalist on call if the hospitalist that took care of you is  not available. Once you are discharged, your primary care physician will handle any further medical issues. Please note that NO REFILLS for any discharge medications will be authorized once you are discharged, as it is imperative that you return to your primary care physician (or establish a relationship with a primary care physician if you do not have one) for your aftercare needs so that they can reassess your need for medications and monitor your lab values.    Today   CHIEF COMPLAINT:   Chief Complaint  Patient presents with  . Fall    HISTORY OF PRESENT ILLNESS:  [name] [name]  is a 87 y.o. male came in after a fall and found to have a fractured hip.   VITAL SIGNS:  Blood pressure 129/77, pulse 85, temperature 98.1 F (36.7 C), temperature source Oral, resp. rate 18, height 5\' 10"  (1.778 m), weight 97.5 kg, SpO2 94 %.    PHYSICAL EXAMINATION:  GENERAL:  87 y.o.-year-old patient lying in the bed with no acute distress.  EYES: Pupils equal, round, reactive to light and accommodation. No scleral icterus.  HEENT: Head atraumatic, normocephalic.  Thrush on tongue.  LUNGS: Normal breath sounds bilaterally, slight expiratory wheezing left mid and lower lung.  No rales,rhonchi or crepitation. No use of accessory muscles of respiration.  CARDIOVASCULAR: S1, S2 normal. No murmurs, rubs, or gallops.  ABDOMEN: Soft, non-tender, non-distended.  EXTREMITIES: No pedal edema.  NEUROLOGIC: Cranial nerves II through XII are intact.  PSYCHIATRIC: The patient is alert and answers questions appropriately.  SKIN: No obvious rash, lesion, or ulcer.   DATA REVIEW:   CBC Recent Labs  Lab 07/28/20 0611  WBC 10.2  HGB 10.3*  HCT 31.7*  PLT 238    Chemistries  Recent Labs  Lab 07/23/20 0631 07/24/20 0322 07/28/20 0611  NA 137   < > 135  K 3.5   < > 4.4  CL 103   < > 99  CO2 23   < > 28  GLUCOSE 159*   < > 125*  BUN 12   < > 21  CREATININE 0.58*   < > 0.53*  CALCIUM 8.4*   < > 8.2*   AST 15  --   --   ALT 12  --   --   ALKPHOS 88  --   --   BILITOT 0.7  --   --    < > = values in this interval not displayed.    Microbiology Results  Results for orders placed or performed during the hospital encounter of 07/23/20  Resp Panel by RT-PCR (Flu A&B, Covid) Nasopharyngeal Swab     Status: None   Collection Time: 07/23/20  6:50 AM   Specimen: Nasopharyngeal Swab; Nasopharyngeal(NP) swabs in vial transport medium  Result Value Ref Range Status   SARS Coronavirus 2 by RT PCR NEGATIVE NEGATIVE Final    Comment: (NOTE) SARS-CoV-2 target nucleic acids are NOT DETECTED.  The SARS-CoV-2 RNA is generally detectable in upper respiratory specimens during the acute phase of infection. The lowest concentration of SARS-CoV-2 viral copies this assay can detect is 138 copies/mL. A negative result does not preclude SARS-Cov-2 infection and should not be used as the sole basis for treatment or other patient management decisions. A negative result may occur with  improper specimen collection/handling, submission of specimen other than nasopharyngeal swab, presence of viral mutation(s) within the areas targeted by this assay, and inadequate number of viral copies(<138 copies/mL). A negative result must be combined with clinical observations, patient history, and epidemiological information. The expected result is Negative.  Fact Sheet for Patients:  EntrepreneurPulse.com.au  Fact Sheet for Healthcare Providers:  IncredibleEmployment.be  This test is no t yet approved or cleared by the [place] FDA and  has been authorized for detection and/or diagnosis of SARS-CoV-2 by FDA under an Emergency Use Authorization (EUA). This EUA will remain  in effect (meaning this test can be used) for the duration of the COVID-19 declaration under Section 564(b)(1) of the Act, 21 U.S.C.section 360bbb-3(b)(1), unless the authorization is terminated  or  revoked sooner.       Influenza A by PCR NEGATIVE NEGATIVE Final   Influenza B by PCR NEGATIVE NEGATIVE Final    Comment: (NOTE) The Xpert Xpress SARS-CoV-2/FLU/RSV plus assay is intended as an aid in the diagnosis of influenza from Nasopharyngeal swab specimens and should not be used as a sole basis for treatment. Nasal washings and aspirates are  unacceptable for Xpert Xpress SARS-CoV-2/FLU/RSV testing.  Fact Sheet for Patients: EntrepreneurPulse.com.au  Fact Sheet for Healthcare Providers: IncredibleEmployment.be  This test is not yet approved or cleared by the [place] FDA and has been authorized for detection and/or diagnosis of SARS-CoV-2 by FDA under an Emergency Use Authorization (EUA). This EUA will remain in effect (meaning this test can be used) for the duration of the COVID-19 declaration under Section 564(b)(1) of the Act, 21 U.S.C. section 360bbb-3(b)(1), unless the authorization is terminated or revoked.  Performed at [facility], [address], [place], [place] [address]   Surgical PCR screen     Status: None   Collection Time: 07/24/20 11:12 AM   Specimen: Nasal Mucosa; Nasal Swab  Result Value Ref Range Status   MRSA, PCR NEGATIVE NEGATIVE Final   Staphylococcus aureus NEGATIVE NEGATIVE Final    Comment: (NOTE) The Xpert SA Assay (FDA approved for NASAL specimens in patients 17 years of age and older), is one component of a comprehensive surveillance program. It is not intended to diagnose infection nor to guide or monitor treatment. Performed at [facility], [place], [place] [address]   SARS CORONAVIRUS 2 (TAT 6-24 HRS) Nasopharyngeal Nasopharyngeal Swab     Status: None   Collection Time: 07/28/20 11:27 AM   Specimen: Nasopharyngeal Swab  Result Value Ref Range Status   SARS Coronavirus 2 NEGATIVE NEGATIVE Final    Comment: (NOTE) SARS-CoV-2 target nucleic acids are NOT  DETECTED.  The SARS-CoV-2 RNA is generally detectable in upper and lower respiratory specimens during the acute phase of infection. Negative results do not preclude SARS-CoV-2 infection, do not rule out co-infections with other pathogens, and should not be used as the sole basis for treatment or other patient management decisions. Negative results must be combined with clinical observations, patient history, and epidemiological information. The expected result is Negative.  Fact Sheet for Patients: SugarRoll.be  Fact Sheet for Healthcare Providers: https://www.woods-mathews.com/  This test is not yet approved or cleared by the [place] FDA and  has been authorized for detection and/or diagnosis of SARS-CoV-2 by FDA under an Emergency Use Authorization (EUA). This EUA will remain  in effect (meaning this test can be used) for the duration of the COVID-19 declaration under Se ction 564(b)(1) of the Act, 21 U.S.C. section 360bbb-3(b)(1), unless the authorization is terminated or revoked sooner.  Performed at [facility], [place] [address], [place], [place] [address]      Management plans discussed with the patient, family and they are in agreement.  CODE STATUS:     Code Status Orders  (From admission, onward)         Start     Ordered   07/23/20 1436  Do not attempt resuscitation (DNR)  Continuous        07/23/20 1435        Code Status History    Date Active Date Inactive Code Status Order ID Comments User Context   07/23/2020 1141 07/23/2020 1435 Full Code [id number]  Ivor Costa, MD ED   07/23/2020 0901 07/23/2020 1141 DNR [id number]  Ivor Costa, MD ED   Advance Care Planning Activity      TOTAL TIME TAKING CARE OF THIS PATIENT: 35 minutes.    Loletha Grayer M.D on 07/29/2020 at 9:16 AM  Between 7am to 6pm - Pager - [phone]  After 6pm go to [email or web address] - password [email or web address]  [facility]  CC: Primary  care physician; [place],  Lucianne Muss, MD

## 2020-07-29 NOTE — Progress Notes (Addendum)
[facility] ([place] patient RN note:  [name] [name] is a current hospice patient with [facility] with a terminal diagnosis of lung cancer. He is independent and lives alone but reports that on 01.04,  he got up to go to the bathroom,tripped and fellin the morning. He did not lose consciousness .He thinks he might have hit his head but he had no headache and no neck pain.He developed severe pain in the right hip, which was sharp, constant, severe, nonradiating, aggravated by movement.He had no numbness nor tinglingin his legs. He was brought to the Emergency Room by EMS. Son, [name] reports that he forgot to contact the Hospice agency prior to [facility] Liaison visit on 01.06. Patient was admitted to [facility] on 01.04.22 with a diagnosis of right femoral neck fracture. Per Dr. Gilford Rile with [facility], this is a related hospital admission. Patient is a DNR.  Visited with patient and son, [name] in room. Patient was resting in bed and drowsy. Arouses to verbal stimuli and denies any pain or nausea. Son reports that he had just had some pain medication. Per son, plan is still to go to rehab when stable and verifies that he understands that patient will need to revoke hospice services. He and his spouse are looking at a couple of places and will make a decision shortly. Meagan Hagwood, TOC is aware and assisting with discharge needs. Spoke with patient's RN for update. She reports that patient has been more confused at times. His incision is intact with no redness noted but is requiring frequent dressing changes with copious amounts of serosanguineous drainage.She also reports that patient had had no BM since admission. Colace and lactulose was given with results yesterday and today. He continues to work with PT but needs 2+ assist to stand and pivot.  Vital Signs: BP 105/64, HR 70, Resp 16, Temp 98.2, O2 sat 94% on RA  I & O: 166ml / 122ml--Net since  admit -1464ml  Abnormal labs: Glucose: 125 (H) Creatinine: 0.53 (L) Calcium: 8.2 (L) RBC: 4.09 (L) Hemoglobin: 10.3 (L) HCT: 31.7 (L) MCV: 77.5 (L) MCH: 25.2 (L)  Diagnostics: none new  IV/PRN meds: traMADol (ULTRAM) tablet 50 mg Dose: 50 mg Freq: Every 6 hours PRN Route: PO  HYDROcodone-acetaminophen (NORCO) 7.5-325 MG per tablet 1-2 tablet Dose: 1-2 tablet Freq: Every 4 hours PRN Route: PO  ondansetron (ZOFRAN) injection 4 mg Dose: 4 mg Freq: Every 6 hours PRN Route: IV  Problem list: Principal Problem:   Closed displaced fracture of right femoral neck (HCC) Active Problems:   Essential hypertension   History of stroke   Pure hypercholesterolemia   Fall   CAD (coronary artery disease)   Mural thrombus of heart   Nausea and vomiting   Hyperlipidemia   Hx of renal cell cancer   Wheeze   Malignant neoplasm metastatic to right lung [facility])  Discharge Planning: Ongoing Plans to discharge to SNF for Rehab and revoke Hospice  Family Contact: Spoke with son, [name] in room. No issues.  IDG: Updated  Goals of Care: Clear  Reason for continued need for GIP: Medication adjustments for pain due to increased confusion and drowsiness. IV Medication for nausea. No BM in 5 days. Frequent dressing changes due to copious drainage from surgical site.  Please call with any hospice related questions or concerns.  Thank you.  Zandra Abts, RN [facility] Liaison  [phone]

## 2020-07-29 NOTE — Progress Notes (Signed)
Patient ID: [name] [name], male   DOB: March 23, 1932, 87 y.o.   MRN: [id number] [facility] PROGRESS NOTE  [name] [name] GEX:[id number] DOB: [birth year] DOA: 07/23/2020 PCP: Dion Body, MD  HPI/Subjective: Notified that the rehab facility would not have a bed until tomorrow.  Discharge canceled today.  Initially admitted with hip fracture.  Has a little pain in his hip.  Objective: Vitals:   07/29/20 0745 07/29/20 1121  BP: 129/77 105/64  Pulse: 85 70  Resp: 18 16  Temp: 98.1 F (36.7 C) 98.2 F (36.8 C)  SpO2: 94% 94%    Intake/Output Summary (Last 24 hours) at 07/29/2020 1458 Last data filed at 07/29/2020 1057 Gross per 24 hour  Intake 120 ml  Output 1 ml  Net 119 ml   Filed Weights   07/23/20 0623  Weight: 97.5 kg    ROS: Review of Systems  Respiratory: Negative for shortness of breath.   Cardiovascular: Negative for chest pain.  Gastrointestinal: Negative for abdominal pain.  Musculoskeletal: Positive for joint pain.   Exam: Physical Exam HENT:     Head: Normocephalic.     Mouth/Throat:     Pharynx: No oropharyngeal exudate.     Comments: Slight thrush on tongue. Eyes:     General: Lids are normal.     Conjunctiva/sclera: Conjunctivae normal.  Cardiovascular:     Rate and Rhythm: Normal rate and regular rhythm.     Heart sounds: Normal heart sounds, S1 normal and S2 normal.  Pulmonary:     Breath sounds: Examination of the right-middle field reveals wheezing. Examination of the right-lower field reveals wheezing. Examination of the left-lower field reveals wheezing. Wheezing present. No decreased breath sounds, rhonchi or rales.  Abdominal:     Palpations: Abdomen is soft.     Tenderness: There is no abdominal tenderness.  Musculoskeletal:     Right ankle: Swelling present.     Left ankle: Swelling present.  Skin:    General: Skin is warm.     Findings: No rash.  Neurological:     Mental Status: He is alert and oriented to person, place, and time.        Data Reviewed: Basic Metabolic Panel: Recent Labs  Lab 07/23/20 0631 07/24/20 0322 07/25/20 0322 07/26/20 0650 07/28/20 0611  NA 137 138 137 137 135  K 3.5 3.6 3.3* 3.2* 4.4  CL 103 102 98 98 99  CO2 23 28 30 29 28   GLUCOSE 159* 124* 99 158* 125*  BUN 12 14 18 22 21   CREATININE 0.58* 0.58* 0.62 0.62 0.53*  CALCIUM 8.4* 8.5* 8.0* 8.2* 8.2*   Liver Function Tests: Recent Labs  Lab 07/23/20 0631  AST 15  ALT 12  ALKPHOS 88  BILITOT 0.7  PROT 6.8  ALBUMIN 2.9*   CBC: Recent Labs  Lab 07/23/20 0631 07/24/20 0322 07/25/20 0322 07/26/20 0650 07/27/20 0518 07/28/20 0611  WBC 9.5 12.1* 9.6 12.1* 13.3* 10.2  NEUTROABS 8.0*  --   --   --   --   --   HGB 12.1* 11.7* 11.9* 10.7* 10.1* 10.3*  HCT 38.5* 37.2* 36.5* 33.8* 30.9* 31.7*  MCV 77.9* 78.8* 77.3* 78.1* 77.6* 77.5*  PLT 304 326 293 276 271 238   Cardiac Enzymes: Recent Labs  Lab 07/23/20 0631  CKTOTAL 50   BNP (last 3 results) Recent Labs    07/23/20 0631  BNP 71.8     Recent Results (from the past 240 hour(s))  Resp Panel by RT-PCR (Flu  A&B, Covid) Nasopharyngeal Swab     Status: None   Collection Time: 07/23/20  6:50 AM   Specimen: Nasopharyngeal Swab; Nasopharyngeal(NP) swabs in vial transport medium  Result Value Ref Range Status   SARS Coronavirus 2 by RT PCR NEGATIVE NEGATIVE Final    Comment: (NOTE) SARS-CoV-2 target nucleic acids are NOT DETECTED.  The SARS-CoV-2 RNA is generally detectable in upper respiratory specimens during the acute phase of infection. The lowest concentration of SARS-CoV-2 viral copies this assay can detect is 138 copies/mL. A negative result does not preclude SARS-Cov-2 infection and should not be used as the sole basis for treatment or other patient management decisions. A negative result may occur with  improper specimen collection/handling, submission of specimen other than nasopharyngeal swab, presence of viral mutation(s) within the areas targeted by  this assay, and inadequate number of viral copies(<138 copies/mL). A negative result must be combined with clinical observations, patient history, and epidemiological information. The expected result is Negative.  Fact Sheet for Patients:  EntrepreneurPulse.com.au  Fact Sheet for Healthcare Providers:  IncredibleEmployment.be  This test is no t yet approved or cleared by the [place] FDA and  has been authorized for detection and/or diagnosis of SARS-CoV-2 by FDA under an Emergency Use Authorization (EUA). This EUA will remain  in effect (meaning this test can be used) for the duration of the COVID-19 declaration under Section 564(b)(1) of the Act, 21 U.S.C.section 360bbb-3(b)(1), unless the authorization is terminated  or revoked sooner.       Influenza A by PCR NEGATIVE NEGATIVE Final   Influenza B by PCR NEGATIVE NEGATIVE Final    Comment: (NOTE) The Xpert Xpress SARS-CoV-2/FLU/RSV plus assay is intended as an aid in the diagnosis of influenza from Nasopharyngeal swab specimens and should not be used as a sole basis for treatment. Nasal washings and aspirates are unacceptable for Xpert Xpress SARS-CoV-2/FLU/RSV testing.  Fact Sheet for Patients: EntrepreneurPulse.com.au  Fact Sheet for Healthcare Providers: IncredibleEmployment.be  This test is not yet approved or cleared by the [place] FDA and has been authorized for detection and/or diagnosis of SARS-CoV-2 by FDA under an Emergency Use Authorization (EUA). This EUA will remain in effect (meaning this test can be used) for the duration of the COVID-19 declaration under Section 564(b)(1) of the Act, 21 U.S.C. section 360bbb-3(b)(1), unless the authorization is terminated or revoked.  Performed at [facility], [address], [place], [place] [address]   Surgical PCR screen     Status: None   Collection Time: 07/24/20 11:12 AM    Specimen: Nasal Mucosa; Nasal Swab  Result Value Ref Range Status   MRSA, PCR NEGATIVE NEGATIVE Final   Staphylococcus aureus NEGATIVE NEGATIVE Final    Comment: (NOTE) The Xpert SA Assay (FDA approved for NASAL specimens in patients 47 years of age and older), is one component of a comprehensive surveillance program. It is not intended to diagnose infection nor to guide or monitor treatment. Performed at [facility], [place], [place] [address]   SARS CORONAVIRUS 2 (TAT 6-24 HRS) Nasopharyngeal Nasopharyngeal Swab     Status: None   Collection Time: 07/28/20 11:27 AM   Specimen: Nasopharyngeal Swab  Result Value Ref Range Status   SARS Coronavirus 2 NEGATIVE NEGATIVE Final    Comment: (NOTE) SARS-CoV-2 target nucleic acids are NOT DETECTED.  The SARS-CoV-2 RNA is generally detectable in upper and lower respiratory specimens during the acute phase of infection. Negative results do not preclude SARS-CoV-2 infection,  do not rule out co-infections with other pathogens, and should not be used as the sole basis for treatment or other patient management decisions. Negative results must be combined with clinical observations, patient history, and epidemiological information. The expected result is Negative.  Fact Sheet for Patients: SugarRoll.be  Fact Sheet for Healthcare Providers: https://www.woods-mathews.com/  This test is not yet approved or cleared by the [place] FDA and  has been authorized for detection and/or diagnosis of SARS-CoV-2 by FDA under an Emergency Use Authorization (EUA). This EUA will remain  in effect (meaning this test can be used) for the duration of the COVID-19 declaration under Se ction 564(b)(1) of the Act, 21 U.S.C. section 360bbb-3(b)(1), unless the authorization is terminated or revoked sooner.  Performed at [facility], [place] [address] [place], [place] [address]        Scheduled Meds: . amLODipine  5 mg Oral q morning - 10a  . apixaban  5 mg Oral BID  . atorvastatin  10 mg Oral Daily  . Chlorhexidine Gluconate Cloth  6 each Topical Daily  . docusate sodium  100 mg Oral BID  . feeding supplement  237 mL Oral TID BM  . ipratropium-albuterol  3 mL Nebulization Q6H  . lisinopril  40 mg Oral q morning - 10a  . metoprolol tartrate  50 mg Oral BID  . mometasone-formoterol  2 puff Inhalation BID  . multivitamin with minerals  1 tablet Oral Daily  . nystatin  5 mL Oral QID  . potassium chloride  20 mEq Oral BID  . senna  1 tablet Oral BID  . tamsulosin  0.4 mg Oral QPC breakfast  . zinc sulfate  220 mg Oral Daily   Assessment/Plan:  1. Closed right femoral neck fracture requiring operative repair.  Postop day 4 right hip hemiarthroplasty.  Activity as tolerated.  Follow-up orthopedics 2 weeks.  As needed pain control.  Got rid of standing dose tramadol yesterday. 2. Intermittent wheeze.  Continue nebulizer treatments.  Start Dulera inhaler. 3. Hyperlipidemia unspecified on Lipitor 4. History of nonhemorrhagic stroke and mural thrombus of the heart on Eliquis 5. History of renal cell carcinoma and melanoma and also has metastases to the right lung. 6. Foley catheter will have to stay in for 1 week.  Urology can do a voiding trial as outpatient or can be done at the facility. 7. Essential hypertension on metoprolol amlodipine or lisinopril 8. Thrush on the tongue on nystatin swish and swallow        Code Status:     Code Status Orders  (From admission, onward)         Start     Ordered   07/23/20 1436  Do not attempt resuscitation (DNR)  Continuous        07/23/20 1435        Code Status History    Date Active Date Inactive Code Status Order ID Comments User Context   07/23/2020 1141 07/23/2020 1435 Full Code [id number]  Ivor Costa, MD ED   07/23/2020 0901 07/23/2020 1141 DNR [id number]  Ivor Costa, MD ED   Advance Care Planning Activity      Family Communication: Spoke with son Disposition Plan: Status is: Inpatient  Dispo: The patient is from: Home              Anticipated d/c is to: Rehab              Anticipated d/c date is: Rehab bed will be available  until tomorrow              Patient currently can go into rehab  Time spent: 40 minutes today  [place]

## 2020-07-29 NOTE — Progress Notes (Signed)
Pt's honeycomb dressing on the right hip saturated with serosanguinous exudate. Notified orthopedic surgeon. Per Surgeon, okay to change dressing. Dressing changed and incision site without redness and is clean.

## 2020-07-30 DIAGNOSIS — I513 Intracardiac thrombosis, not elsewhere classified: Secondary | ICD-10-CM | POA: Diagnosis not present

## 2020-07-30 DIAGNOSIS — R062 Wheezing: Secondary | ICD-10-CM | POA: Diagnosis not present

## 2020-07-30 DIAGNOSIS — Z8673 Personal history of transient ischemic attack (TIA), and cerebral infarction without residual deficits: Secondary | ICD-10-CM | POA: Diagnosis not present

## 2020-07-30 DIAGNOSIS — S72001A Fracture of unspecified part of neck of right femur, initial encounter for closed fracture: Secondary | ICD-10-CM | POA: Diagnosis not present

## 2020-07-30 LAB — SURGICAL PATHOLOGY

## 2020-07-30 NOTE — Progress Notes (Signed)
Patient ID: [name] [name], male   DOB: 1932/05/14, 87 y.o.   MRN: [id number]  Called with pathologist.  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  This information was discussed with his son.  The patient does have metastatic cancer and is followed by a [facility] hospice and is a DNR.  Can follow-up with his oncologist at the [place].  Dr Loletha Grayer

## 2020-07-30 NOTE — Progress Notes (Signed)
Patient ID: [name] [name], male   DOB: May 30, 1932, 87 y.o.   MRN: [id number] [facility] PROGRESS NOTE  [name] [name] UVO:[id number] DOB: 1932-03-12 DOA: 07/23/2020 PCP: Dion Body, MD  HPI/Subjective: Patient feels okay.  Still has a little pain in his hip no complaints of shortness of breath cough or wheezing.  Initially admitted with fall and hip fracture.  Objective: Vitals:   07/30/20 0014 07/30/20 0553  BP: 128/64 (!) 142/75  Pulse: (!) 107 85  Resp: 16 16  Temp: (!) 97.5 F (36.4 C) 98 F (36.7 C)  SpO2: 92% 96%    Filed Weights   07/23/20 0623  Weight: 97.5 kg    ROS: Review of Systems  Respiratory: Negative for cough and shortness of breath.   Cardiovascular: Negative for chest pain.  Gastrointestinal: Negative for abdominal pain, nausea and vomiting.  Musculoskeletal: Positive for joint pain.   Exam: Physical Exam HENT:     Head: Normocephalic.     Mouth/Throat:     Pharynx: No oropharyngeal exudate.     Comments: Thrush on tongue improved but still slightly present. Eyes:     General: Lids are normal.     Conjunctiva/sclera: Conjunctivae normal.  Cardiovascular:     Rate and Rhythm: Normal rate and regular rhythm.     Heart sounds: Normal heart sounds, S1 normal and S2 normal.  Pulmonary:     Breath sounds: Examination of the left-middle field reveals wheezing. Examination of the left-lower field reveals wheezing. Wheezing present. No decreased breath sounds, rhonchi or rales.  Abdominal:     Palpations: Abdomen is soft.     Tenderness: There is no abdominal tenderness.  Musculoskeletal:     Right lower leg: No swelling.     Left lower leg: No swelling.  Skin:    General: Skin is warm.     Findings: No rash.  Neurological:     Mental Status: He is alert.     Comments: Answers all questions appropriately.       Data Reviewed: Basic Metabolic Panel: Recent Labs  Lab 07/24/20 0322 07/25/20 0322 07/26/20 0650 07/28/20 0611  NA 138 137  137 135  K 3.6 3.3* 3.2* 4.4  CL 102 98 98 99  CO2 28 30 29 28   GLUCOSE 124* 99 158* 125*  BUN 14 18 22 21   CREATININE 0.58* 0.62 0.62 0.53*  CALCIUM 8.5* 8.0* 8.2* 8.2*   CBC: Recent Labs  Lab 07/24/20 0322 07/25/20 0322 07/26/20 0650 07/27/20 0518 07/28/20 0611  WBC 12.1* 9.6 12.1* 13.3* 10.2  HGB 11.7* 11.9* 10.7* 10.1* 10.3*  HCT 37.2* 36.5* 33.8* 30.9* 31.7*  MCV 78.8* 77.3* 78.1* 77.6* 77.5*  PLT 326 293 276 271 238   BNP (last 3 results) Recent Labs    07/23/20 0631  BNP 71.8     Recent Results (from the past 240 hour(s))  Resp Panel by RT-PCR (Flu A&B, Covid) Nasopharyngeal Swab     Status: None   Collection Time: 07/23/20  6:50 AM   Specimen: Nasopharyngeal Swab; Nasopharyngeal(NP) swabs in vial transport medium  Result Value Ref Range Status   SARS Coronavirus 2 by RT PCR NEGATIVE NEGATIVE Final    Comment: (NOTE) SARS-CoV-2 target nucleic acids are NOT DETECTED.  The SARS-CoV-2 RNA is generally detectable in upper respiratory specimens during the acute phase of infection. The lowest concentration of SARS-CoV-2 viral copies this assay can detect is 138 copies/mL. A negative result does not preclude SARS-Cov-2 infection and should not be  used as the sole basis for treatment or other patient management decisions. A negative result may occur with  improper specimen collection/handling, submission of specimen other than nasopharyngeal swab, presence of viral mutation(s) within the areas targeted by this assay, and inadequate number of viral copies(<138 copies/mL). A negative result must be combined with clinical observations, patient history, and epidemiological information. The expected result is Negative.  Fact Sheet for Patients:  EntrepreneurPulse.com.au  Fact Sheet for Healthcare Providers:  IncredibleEmployment.be  This test is no t yet approved or cleared by the [place] FDA and  has been authorized for  detection and/or diagnosis of SARS-CoV-2 by FDA under an Emergency Use Authorization (EUA). This EUA will remain  in effect (meaning this test can be used) for the duration of the COVID-19 declaration under Section 564(b)(1) of the Act, 21 U.S.C.section 360bbb-3(b)(1), unless the authorization is terminated  or revoked sooner.       Influenza A by PCR NEGATIVE NEGATIVE Final   Influenza B by PCR NEGATIVE NEGATIVE Final    Comment: (NOTE) The Xpert Xpress SARS-CoV-2/FLU/RSV plus assay is intended as an aid in the diagnosis of influenza from Nasopharyngeal swab specimens and should not be used as a sole basis for treatment. Nasal washings and aspirates are unacceptable for Xpert Xpress SARS-CoV-2/FLU/RSV testing.  Fact Sheet for Patients: EntrepreneurPulse.com.au  Fact Sheet for Healthcare Providers: IncredibleEmployment.be  This test is not yet approved or cleared by the [place] FDA and has been authorized for detection and/or diagnosis of SARS-CoV-2 by FDA under an Emergency Use Authorization (EUA). This EUA will remain in effect (meaning this test can be used) for the duration of the COVID-19 declaration under Section 564(b)(1) of the Act, 21 U.S.C. section 360bbb-3(b)(1), unless the authorization is terminated or revoked.  Performed at [facility], [address], [place], [place] [address]   Surgical PCR screen     Status: None   Collection Time: 07/24/20 11:12 AM   Specimen: Nasal Mucosa; Nasal Swab  Result Value Ref Range Status   MRSA, PCR NEGATIVE NEGATIVE Final   Staphylococcus aureus NEGATIVE NEGATIVE Final    Comment: (NOTE) The Xpert SA Assay (FDA approved for NASAL specimens in patients 75 years of age and older), is one component of a comprehensive surveillance program. It is not intended to diagnose infection nor to guide or monitor treatment. Performed at [facility], [place], [place] [address]   SARS CORONAVIRUS 2 (TAT 6-24 HRS) Nasopharyngeal Nasopharyngeal Swab     Status: None   Collection Time: 07/28/20 11:27 AM   Specimen: Nasopharyngeal Swab  Result Value Ref Range Status   SARS Coronavirus 2 NEGATIVE NEGATIVE Final    Comment: (NOTE) SARS-CoV-2 target nucleic acids are NOT DETECTED.  The SARS-CoV-2 RNA is generally detectable in upper and lower respiratory specimens during the acute phase of infection. Negative results do not preclude SARS-CoV-2 infection, do not rule out co-infections with other pathogens, and should not be used as the sole basis for treatment or other patient management decisions. Negative results must be combined with clinical observations, patient history, and epidemiological information. The expected result is Negative.  Fact Sheet for Patients: SugarRoll.be  Fact Sheet for Healthcare Providers: https://www.woods-mathews.com/  This test is not yet approved or cleared by the [place] FDA and  has been authorized for detection and/or diagnosis of SARS-CoV-2 by FDA under an Emergency Use Authorization (EUA). This EUA will remain  in effect (meaning this test can be used)  for the duration of the COVID-19 declaration under Se ction 564(b)(1) of the Act, 21 U.S.C. section 360bbb-3(b)(1), unless the authorization is terminated or revoked sooner.  Performed at [facility], [place] [address], [place],  [address]      Scheduled Meds: . amLODipine  5 mg Oral q morning - 10a  . apixaban  5 mg Oral BID  . atorvastatin  10 mg Oral Daily  . Chlorhexidine Gluconate Cloth  6 each Topical Daily  . docusate sodium  100 mg Oral BID  . feeding supplement  237 mL Oral TID BM  . ipratropium-albuterol  3 mL Nebulization Q6H  . lisinopril  40 mg Oral q morning - 10a  . metoprolol tartrate  50 mg Oral BID  . mometasone-formoterol  2 puff Inhalation BID  . multivitamin with  minerals  1 tablet Oral Daily  . nystatin  5 mL Oral QID  . potassium chloride  20 mEq Oral BID  . senna  1 tablet Oral BID  . tamsulosin  0.4 mg Oral QPC breakfast  . zinc sulfate  220 mg Oral Daily    Assessment/Plan:  1. Closed right femoral neck fracture requiring operative repair.  Postop day five right hip hemiarthroplasty.  Activity as tolerated.  Try to convert to Tylenol as quickly as possible.  As needed pain control.  Follow-up orthopedics 2 weeks. 2. Intermittent wheeze during the hospitalization.  Continue nebulizer treatment and Dulera inhaler.  Trying to hold off on steroid dosing. 3. Hyperlipidemia unspecified.  Continue Lipitor 4. History of nonhemorrhagic stroke and mural thrombus of the heart.  Continue Eliquis 5. History of renal cell carcinoma and melanoma also has metastases to the right lung.  Has received prior treatment.  Follows up with a [facility] hospice.  Patient is a DNR. 6. Foley catheter has to stay in for 1 week.  Urology can do a voiding trial as outpatient. 7. Essential hypertension on metoprolol, amlodipine and lisinopril 8. Thrush on nystatin swish and swallow        Code Status:     Code Status Orders  (From admission, onward)         Start     Ordered   07/23/20 1436  Do not attempt resuscitation (DNR)  Continuous        07/23/20 1435        Code Status History    Date Active Date Inactive Code Status Order ID Comments User Context   07/23/2020 1141 07/23/2020 1435 Full Code [id number]  Ivor Costa, MD ED   07/23/2020 0901 07/23/2020 1141 DNR [id number]  Ivor Costa, MD ED   Advance Care Planning Activity     Disposition Plan: Status is: Inpatient  Dispo: The patient is from: Home              Anticipated d/c is to: Rehab              Anticipated d/c date is: Today              Patient currently can go out to rehab when bed available Time spent: 25 minutes  [place]

## 2020-07-30 NOTE — Progress Notes (Signed)
Physical Therapy Treatment Patient Details Name: [name] [name] MRN: [id number] DOB: Sep 13, 1931 Today's Date: 07/30/2020    History of Present Illness 87 y.o. 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  Pt diagnosed with Right Displaced Femoral Neck Hip Fracture with metastatic lesion in the proximal femoral metaphysis and is s/p R hip hemiarthroplasty.    PT Comments    Nursing had taken out IV in preparation for d/c later today.  Deferred much mobility but did do supine exercises and review of precautions, etc.  Pt showed good effort and appears to have less pain with activity this date, but is still weak and limited with how much he is able to do regarding AROM.    Follow Up Recommendations  SNF;Supervision for mobility/OOB     Equipment Recommendations  3in1 (PT)    Recommendations for Other Services       Precautions / Restrictions Precautions Precautions: Posterior Hip Restrictions Weight Bearing Restrictions: Yes RLE Weight Bearing: Weight bearing as tolerated    Mobility  Bed Mobility                  Transfers                    Ambulation/Gait                 Stairs             Wheelchair Mobility    Modified Rankin (Stroke Patients Only)       Balance                                            Cognition Arousal/Alertness: Awake/alert Behavior During Therapy: WFL for tasks assessed/performed Overall Cognitive Status: Within Functional Limits for tasks assessed                                        Exercises Total Joint Exercises Ankle  Circles/Pumps: AROM;Strengthening;Both;10 reps;15 reps Quad Sets:  (AAROM for DF) Short Arc Quad: AAROM;AROM;10 reps (able to initiate movement but lacks AROM in full knee ext range) Heel Slides: AAROM;10 reps (with resisted leg extensions) Hip ABduction/ADduction: AAROM;10 reps    General Comments        Pertinent Vitals/Pain Faces Pain Scale: Hurts little more Pain Location: R hip    Home Living                      Prior Function            PT Goals (current goals can now be found in the care plan section) Progress towards PT goals: Progressing toward goals    Frequency    BID      PT Plan Current plan remains appropriate    Co-evaluation              AM-PAC PT "6 Clicks" Mobility   Outcome Measure  Help needed turning from your back to your side  while in a flat bed without using bedrails?: A Lot Help needed moving from lying on your back to sitting on the side of a flat bed without using bedrails?: A Lot Help needed moving to and from a bed to a chair (including a wheelchair)?: A Lot Help needed standing up from a chair using your arms (e.g., wheelchair or bedside chair)?: A Lot Help needed to walk in hospital room?: Total Help needed climbing 3-5 steps with a railing? : Total 6 Click Score: 10    End of Session   Activity Tolerance: Patient limited by pain Patient left: with call bell/phone within reach;with family/visitor present;with bed alarm set Nurse Communication: Mobility status PT Visit Diagnosis: Unsteadiness on feet (R26.81);Repeated falls (R29.6);Other abnormalities of gait and mobility (R26.89);Muscle weakness (generalized) (M62.81)     Time: 0071-2197 PT Time Calculation (min) (ACUTE ONLY): 13 min  Charges:  $Therapeutic Exercise: 8-22 mins                     Kreg Shropshire, DPT 07/30/2020, 12:28 PM

## 2020-07-30 NOTE — Progress Notes (Signed)
[place] Room [facility]) Hospital Liaison RN note:  Spoke with son, [name] via phone to confirm that patient would be transferring today to [facility] for rehab. He stated that he would be signing the paperwork today and agreed that his father was alert and oriented and could sign his own revocation papers. Spoke with Meagan Hagwood, TOC to confirm that patient would be transferred today. Met with patient in room to discuss choice of revoking hospice services in order to pursue rehab. Patient verbalized understanding and revocation forms were signed.  Thank you.   Zandra Abts, RN [facility] Liaison [phone]

## 2020-07-30 NOTE — Progress Notes (Signed)
Pt dc to [facility], report called to the receiving nurse Monet. IV dc, AVS and original priscriptions placed in the package.

## 2020-07-30 NOTE — TOC Transition Note (Addendum)
Transition of Care [facility]) - CM/SW Discharge Note   Patient Details  Name: [name] [name] MRN: [id number] Date of Birth: 11-Mar-1932  Transition of Care [facility]) CM/SW Contact:  Magnus Ivan, LCSW Phone Number: 07/30/2020, 10:31 AM   Clinical Narrative:   Patient to discharge to [facility] today, Room 108. Confirmed with Lexine Baton at [facility]. CSW updated MD, RN, and son. Asked RN to call report and MD to submit DC Summary. Medical Necessity Form, DNR, and Face Sheet placed in Discharge Packet by patient chart. CSW will call EMS once informed by RN that patient is ready for transport.  11:08- Informed by RN patient is ready for transport. Arranged with [facility] for first available, 12:30 pick up time. Updated son and Therapist, sports.  Asked Emergency planning/management officer to fax hospice revocation paperwork to [place] at [facility].   Final next level of care: Skilled Nursing Facility Barriers to Discharge: Barriers Resolved   Patient Goals and CMS Choice Patient states their goals for this hospitalization and ongoing recovery are:: SNF rehab CMS Medicare.gov Compare Post Acute Care list provided to:: Patient Represenative (must comment) Choice offered to / list presented to : Adult Children  Discharge Placement              Patient chooses bed at: [facility] Patient to be transferred to facility by: EMS Name of family member notified: [name] son Patient and family notified of of transfer: 07/30/20  Discharge Plan and Services   Discharge Planning Services: CM Consult                                 Social Determinants of Health (SDOH) Interventions     Readmission Risk Interventions No flowsheet data found.

## 2020-08-01 ENCOUNTER — Encounter: Payer: Self-pay | Admitting: Orthopedic Surgery

## 2020-08-01 ENCOUNTER — Non-Acute Institutional Stay (SKILLED_NURSING_FACILITY): Payer: Medicare Other | Admitting: Orthopedic Surgery

## 2020-08-01 DIAGNOSIS — Z8781 Personal history of (healed) traumatic fracture: Secondary | ICD-10-CM | POA: Diagnosis not present

## 2020-08-01 DIAGNOSIS — N401 Enlarged prostate with lower urinary tract symptoms: Secondary | ICD-10-CM

## 2020-08-01 DIAGNOSIS — I10 Essential (primary) hypertension: Secondary | ICD-10-CM | POA: Diagnosis not present

## 2020-08-01 DIAGNOSIS — C342 Malignant neoplasm of middle lobe, bronchus or lung: Secondary | ICD-10-CM | POA: Diagnosis not present

## 2020-08-01 DIAGNOSIS — N138 Other obstructive and reflux uropathy: Secondary | ICD-10-CM

## 2020-08-01 DIAGNOSIS — L89312 Pressure ulcer of right buttock, stage 2: Secondary | ICD-10-CM

## 2020-08-01 DIAGNOSIS — E7800 Pure hypercholesterolemia, unspecified: Secondary | ICD-10-CM | POA: Diagnosis not present

## 2020-08-01 DIAGNOSIS — Z8673 Personal history of transient ischemic attack (TIA), and cerebral infarction without residual deficits: Secondary | ICD-10-CM

## 2020-08-01 DIAGNOSIS — R062 Wheezing: Secondary | ICD-10-CM

## 2020-08-01 NOTE — Progress Notes (Signed)
Location:  [place] Room Number: 108/ P Place of Service:  SNF [phone]) Provider:  Windell Moulding, AGNP-C  Dion Body, MD  Patient Care Team: Dion Body, MD as PCP - General (Family Medicine)  Extended Emergency Contact Information Primary Emergency Contact: Koleen Nimrod [place] Phone: [phone] Mobile Phone: [phone] Relation: Son  Goals of care: Advanced Directive information Advanced Directives 07/24/2020  Does Patient Have a Medical Advance Directive? -  Type of Advance Directive -  Does patient want to make changes to medical advance directive? -  Copy of [place] in Chart? -  Would patient like information on creating a medical advance directive? No - Patient declined     Chief Complaint  Patient presents with  . Follow-up    Follow up from Hospital     HPI:  Pt is a 87 y.o. male seen today for hospital follow-up s/p admission from [facility] 01/04- 01/11.   He is currently a resident at [facility], seen today in room. PMH includes: hypertension, aortic atherosclerosis, CAD, lung cancer, GERD, OA, renal cell cancer, BPH, weakness and fall.   Prior to hospitalization, he lived alone and was followed by [facility] hospice. 01/04 he tripped trying to ambulate to the bathroom. He developed severe right hip pain after. Unsure if he hit is head during fall. In the ED, WBC 9.5, INR 1.2, PTT 37, CK 50, Covid-19 negative, electrolytes unremarkable, renal function stable, vital signs normal. CXR indicated possible right upper lobe mass, cardiomegaly, and possible right 7th rib fracture. Xray right hip showed displaced femoral neck fracture. Admitted for right hip fracture. Orthopedics consulted, right hemi arthroplasty performed by Dr. Mack Guise. He tolerated procedure well. Given morphine and percocet prn for pain, robaxin for muscle spasms. Given dulera and nebs  for intermittent wheezing, no steroids given. Urology consulted due to difficulty placing foley in operating room. 20F coude placed and advised to f/u 1 week after discharge. SNF recommended for discharge.   He was admitted to [facility] 01/11. He is currently receiving PT/OT and skilled nursing services. Today, his son is present for encounter. He is alert and oriented x 3. He cannot state the day of week, cannot name nursing home, but knows he is at rehab. Son states since his surgery he has been foggy at times. Ardian states his right hip hurts at times, but not all the time. Also complains of the catheter and insertion site being very discomforting. He is scheduled to see urology 01/18 for post void trial. Appetite fair. Bowel movement today and yesterday. Remains max assist sit to stand and transfers.   No recent falls or injuries.   Facility nurse does not report any concerns, vitals stable.    Past Medical History:  Diagnosis Date  . Arthritis   . Cancer ([place]) 45 years ago melanoma  . Enlarged prostate 11/21/14  . GERD (gastroesophageal reflux disease)   . History of kidney stones   . Hypertension   . Renal cancer ([place])   . Stroke [facility]) [id number]   slight weakness RT side   Past Surgical History:  Procedure Laterality Date  . CHOLECYSTECTOMY N/A   . CYSTOSCOPY WITH LITHOLAPAXY N/A 09/30/2015   Procedure: CYSTOSCOPY WITH LITHOLAPAXY;  Surgeon: Hollice Espy, MD;  Location: [facility] ORS;  Service: Urology;  Laterality: N/A;  . HIP ARTHROPLASTY Right 07/25/2020   Procedure: ARTHROPLASTY BIPOLAR HIP (HEMIARTHROPLASTY);  Surgeon: Thornton Park, MD;  Location:  [facility] ORS;  Service: Orthopedics;  Laterality: Right;  . HOLEP-LASER ENUCLEATION OF THE PROSTATE WITH MORCELLATION N/A 09/30/2015   Procedure: HOLEP-LASER ENUCLEATION OF THE PROSTATE WITH MORCELLATION;  Surgeon: Hollice Espy, MD;  Location: [facility] ORS;  Service: Urology;  Laterality: N/A;  . IR GENERIC HISTORICAL  09/08/2016   IR  RADIOLOGIST EVAL & MGMT 09/08/2016 Greggory Keen, MD [facility] INTERV RAD  . JOINT REPLACEMENT Bilateral 4-5 years ago   kness replaced   . LAPAROTOMY  2004   1 week after choley "kinicked bile duct"  . SKIN CANCER EXCISION  1973    No Known Allergies  Outpatient Encounter Medications as of 08/01/2020  Medication Sig  . acetaminophen (TYLENOL) 325 MG tablet Take 1-2 tablets (325-650 mg total) by mouth every 6 (six) hours as needed for mild pain (pain score 1-3 or temp > 100.5).  Marland Kitchen amLODipine (NORVASC) 5 MG tablet Take 5 mg by mouth every morning.   Marland Kitchen apixaban (ELIQUIS) 5 MG TABS tablet Take 5 mg by mouth 2 (two) times daily.  Marland Kitchen atorvastatin (LIPITOR) 10 MG tablet Take 10 mg by mouth daily.  . beta carotene 25000 UNIT capsule Take 25,000 Units by mouth daily.  Marland Kitchen docusate sodium (COLACE) 100 MG capsule Take 1 capsule (100 mg total) by mouth 2 (two) times daily.  . feeding supplement (ENSURE ENLIVE / ENSURE PLUS) LIQD Take 237 mLs by mouth 3 (three) times daily between meals.  Marland Kitchen HYDROcodone-acetaminophen (NORCO/VICODIN) 5-325 MG tablet Take 1-2 tablets by mouth every 6 (six) hours as needed for moderate pain or severe pain.  Marland Kitchen ipratropium-albuterol (DUONEB) 0.5-2.5 (3) MG/3ML SOLN Take 3 mLs by nebulization every 6 (six) hours.  Marland Kitchen lisinopril (PRINIVIL,ZESTRIL) 40 MG tablet Take 40 mg by mouth every morning.   . metoprolol (LOPRESSOR) 50 MG tablet Take 50 mg by mouth 2 (two) times daily.  . mometasone-formoterol (DULERA) 100-5 MCG/ACT AERO Inhale 2 puffs into the lungs 2 (two) times daily.  . Multiple Vitamin (MULTIVITAMIN WITH MINERALS) TABS tablet Take 1 tablet by mouth daily.  Marland Kitchen nystatin (MYCOSTATIN) 100000 UNIT/ML suspension Take 5 mLs (500,000 Units total) by mouth 4 (four) times daily for 10 days.  . polyethylene glycol (MIRALAX / GLYCOLAX) 17 g packet Take 17 g by mouth daily as needed for mild constipation.  . potassium chloride SA (K-DUR,KLOR-CON) 20 MEQ tablet Take 20 mEq by mouth 2 (two)  times daily.  . tamsulosin (FLOMAX) 0.4 MG CAPS capsule Take 1 capsule (0.4 mg total) by mouth daily after breakfast.  . Zinc 50 MG TABS Take 50 mg by mouth daily.   No facility-administered encounter medications on file as of 08/01/2020.    Review of Systems  Constitutional: Negative for activity change, appetite change and fever.  HENT: Negative for congestion, dental problem, hearing loss and trouble swallowing.   Eyes: Negative for photophobia.       Glasses  Respiratory: Positive for cough and wheezing. Negative for shortness of breath.   Cardiovascular: Negative for chest pain and leg swelling.  Gastrointestinal: Negative for abdominal pain, constipation, diarrhea and nausea.  Genitourinary: Positive for difficulty urinating. Negative for hematuria.       Foley catheter  Musculoskeletal:       Right hip pain  Skin:       Pressure injury to sacrum, right hip surgical incision, dry skin  Neurological: Positive for weakness. Negative for dizziness and headaches.  Hematological: Bruises/bleeds easily.  Psychiatric/Behavioral: Positive for confusion. Negative for dysphoric mood and sleep disturbance. The  patient is not nervous/anxious.     Immunization History  Administered Date(s) Administered  . Influenza Inj Mdck Quad Pf 06/01/2017  . Influenza, High Dose Seasonal PF 06/28/2015, 04/18/2016  . Influenza-Unspecified 04/19/2012, 06/01/2017  . PFIZER SARS-COV-2 Vaccination 08/03/2019, 08/24/2019  . Pneumococcal Conjugate-13 07/20/2014  . Pneumococcal Polysaccharide-23 07/20/2012  . Tdap 07/20/2006  . Zoster 10/19/2010   Pertinent  Health Maintenance Due  Topic Date Due  . INFLUENZA VACCINE  Completed  . PNA vac Low Risk Adult  Completed   No flowsheet data found. Functional Status Survey:    Vitals:   08/01/20 1005  BP: (!) 142/68  Pulse: 95  Resp: 20  Temp: 97.7 F (36.5 C)  Weight: 215 lb (97.5 kg)  Height: 5\' 10"  (1.778 m)   Body mass index is 30.85  kg/m. Physical Exam Vitals reviewed.  Constitutional:      General: He is not in acute distress. HENT:     Head: Normocephalic.     Right Ear: There is no impacted cerumen.     Left Ear: There is no impacted cerumen.     Nose: Nose normal.     Mouth/Throat:     Mouth: Mucous membranes are moist.     Pharynx: No posterior oropharyngeal erythema.  Eyes:     General:        Right eye: No discharge.        Left eye: No discharge.  Cardiovascular:     Rate and Rhythm: Normal rate and regular rhythm.     Pulses: Normal pulses.     Heart sounds: Normal heart sounds. No murmur heard.   Pulmonary:     Effort: Pulmonary effort is normal. No respiratory distress.     Breath sounds: Examination of the right-lower field reveals decreased breath sounds. Examination of the left-lower field reveals decreased breath sounds. Decreased breath sounds present. No wheezing.  Abdominal:     General: Bowel sounds are normal. There is no distension.     Palpations: Abdomen is soft.     Tenderness: There is no abdominal tenderness.  Musculoskeletal:     Cervical back: Normal range of motion.     Right lower leg: No edema.     Left lower leg: No edema.     Comments: Right hip incision, CDI. Staples present. Slight serosanguinous drainage, covered with dry gauze. Surrounding tissue intact.   Lymphadenopathy:     Cervical: No cervical adenopathy.  Skin:    General: Skin is warm and dry.     Capillary Refill: Capillary refill takes less than 2 seconds.     Comments: STAGE 2 PRESSURE INJURY TO RIGHT BUTTOCK: measures 4.0 x 3.0 x 0.1cm, small amount of serous exudate noted. No indications of infection; no erythema, warmth or induration noted. Denies pain/discomfort noted. Periwound intact. No periwound maceration noted. Order in place to cleanse with NS, pat dry, apply Medihoney and cover with foam dressing QD. Pressure redistribution mattress in place. Order in place to float heels while in bed. Order in  place for Glucerna, multivitamin and Zinc will aide in wound healing.   Neurological:     General: No focal deficit present.     Mental Status: He is alert. Mental status is at baseline.     Motor: Weakness present.     Gait: Gait abnormal.     Comments: wheelchair  Psychiatric:        Mood and Affect: Mood normal.        Behavior:  Behavior normal.        Cognition and Memory: Memory is impaired.     Labs reviewed: Recent Labs    07/25/20 0322 07/26/20 0650 07/28/20 0611  NA 137 137 135  K 3.3* 3.2* 4.4  CL 98 98 99  CO2 30 29 28   GLUCOSE 99 158* 125*  BUN 18 22 21   CREATININE 0.62 0.62 0.53*  CALCIUM 8.0* 8.2* 8.2*   Recent Labs    07/23/20 0631  AST 15  ALT 12  ALKPHOS 88  BILITOT 0.7  PROT 6.8  ALBUMIN 2.9*   Recent Labs    07/23/20 0631 07/24/20 0322 07/26/20 0650 07/27/20 0518 07/28/20 0611  WBC 9.5   < > 12.1* 13.3* 10.2  NEUTROABS 8.0*  --   --   --   --   HGB 12.1*   < > 10.7* 10.1* 10.3*  HCT 38.5*   < > 33.8* 30.9* 31.7*  MCV 77.9*   < > 78.1* 77.6* 77.5*  PLT 304   < > 276 271 238   < > = values in this interval not displayed.   No results found for: TSH No results found for: HGBA1C Lab Results  Component Value Date   CHOL 85 07/10/2014   HDL 23 (L) 07/10/2014   LDLCALC 34 07/10/2014   TRIG 141 07/10/2014    Significant Diagnostic Results in last 30 days:  CT HEAD WO CONTRAST  Result Date: 07/23/2020 CLINICAL DATA:  Tripped and fell while walking to the bathroom this morning, struck back of head history of metastatic renal cell carcinoma EXAM: CT HEAD WITHOUT CONTRAST TECHNIQUE: Contiguous axial images were obtained from the base of the skull through the vertex without intravenous contrast. Sagittal and coronal MPR images reconstructed from axial data set. COMPARISON:  07/09/2014 FINDINGS: Brain: Generalized atrophy. Normal ventricular morphology. No midline shift or mass effect. Small vessel chronic ischemic changes of deep cerebral white  matter. No intracranial hemorrhage, mass lesion, or evidence of acute infarction. No extra-axial fluid collections. Vascular: Atherosclerotic calcifications of internal carotid and vertebral arteries at skull base Skull: Demineralized but intact Sinuses/Orbits: Mucosal thickening in a few ethmoid air cells. Remaining visualized paranasal sinuses and mastoid air cells clear Other: N/A IMPRESSION: Atrophy with small vessel chronic ischemic changes of deep cerebral white matter. No acute intracranial abnormalities. Electronically Signed   By: Lavonia Dana M.D.   On: 07/23/2020 09:14   MR HIP RIGHT W WO CONTRAST  Result Date: 07/23/2020 CLINICAL DATA:  Fall with right hip fracture. History of renal cell carcinoma. EXAM: MRI OF THE RIGHT HIP WITHOUT AND WITH CONTRAST TECHNIQUE: Multiplanar, multisequence MR imaging was performed both before and after administration of intravenous contrast. CONTRAST:  7.11mL GADAVIST GADOBUTROL 1 MMOL/ML IV SOLN COMPARISON:  X-ray 07/23/2020 FINDINGS: Bones: Acute fracture of the right femoral neck with slight impaction and external rotation. Fracture line does not involve the intertrochanteric aspect of the femur or the humeral head articular surface. No additional fractures. No femoral head avascular necrosis. Destructive marrow replacing lesion centered within the lateral aspect of the medullary space of the intertrochanteric right femur measuring approximately 3.5 x 3.2 x 3.3 cm (series 4, image 21; series 6, image 16). There is cortical breakthrough along the lateral cortex with a contiguous enhancing extraosseous soft tissue mass which measures approximately 2.2 x 1.0 x 1.7 cm (series 6, image 14; series 8, image 20). There is marked bone marrow edema surrounding the mass. Mass occupies nearly 50% of the cross-sectional  area of the femur. No complete pathologic fracture of the femur. Additional rounded 8 mm T1 hypointense, T2 hyperintense lesion within the posterior left iliac  (series 3, image 11). Additional subcentimeter T2 hyperintense focus within the L4 vertebral body is nonspecific (series 3, image 20). Articular cartilage and labrum Articular cartilage: Mild diffuse chondral thinning and surface irregularity. Labrum:  Degenerated appearance of the superior labrum. Joint or bursal effusion Joint effusion:  No significant joint effusion. Bursae: No bursal fluid collections. Muscles and tendons Muscles and tendons: Intramuscular edema within the right gluteus minimus muscle. Prominent intramuscular edema and patchy enhancement within the proximal vastus lateralis and vastus intermediate muscles adjacent to the bone lesion. No acute tendinous injury. Other findings Miscellaneous: Prostate gland is enlarged with a heterogeneous, nodular configuration resulting in mass effect upon the base of the urinary bladder. Exophytic cyst emanating from the inferior pole of the left kidney measures approximately 2.1 cm. IMPRESSION: 1. Acute fracture of the right femoral neck with slight impaction and rotation. 2. Marrow replacing lesion within the intertrochanteric aspect of the proximal right femur measuring up to 3.5 cm. Cortical breakthrough along the lateral cortex with associated extra-osseous soft tissue mass. Surrounding bone marrow edema without pathologic fracture. Findings are highly suspicious for a metastatic lesion in this patient with known malignancy. 3. Intramuscular edema and enhancement within the proximal aspect of the vastus lateralis and vastus intermedius muscles adjacent to the proximal femoral mass, which may be reactive or reflect tumor infiltration. 4. Additional subcentimeter lesions within the posterior left iliac bone and L4 vertebral body, which may represent additional metastatic lesions. 5. Enlarged, heterogeneous, nodular configuration of the prostate gland resulting in mass effect upon the base of the urinary bladder. Correlate with serum PSA. These results will be  called to the ordering clinician or representative by the Radiologist Assistant, and communication documented in the PACS or [employer]. Electronically Signed   By: Davina Poke D.O.   On: 07/23/2020 10:51   DG Chest Portable 1 View  Result Date: 07/25/2020 CLINICAL DATA:  Hypoxia EXAM: PORTABLE CHEST 1 VIEW COMPARISON:  07/23/2020 chest radiograph and prior studies FINDINGS: Cardiomediastinal silhouette is unchanged. RIGHT UPPER lobe opacity is unchanged. Subsegmental atelectasis within the mid and LOWER LEFT lung are again identified. No large pleural effusion or pneumothorax. IMPRESSION: Unchanged appearance of the chest with RIGHT UPPER lobe opacity and LEFT lung subsegmental atelectasis. Electronically Signed   By: Margarette Canada M.D.   On: 07/25/2020 18:30   DG Chest Port 1 View  Result Date: 07/23/2020 CLINICAL DATA:  Fall. EXAM: PORTABLE CHEST 1 VIEW COMPARISON:  CT 09/08/2017.  Chest x-ray 07/09/2014. FINDINGS: Mediastinum and hilar structures normal. Cardiomegaly. No pulmonary venous congestion. Prominent right upper lung infiltrate versus mass noted. Atelectatic changes left mid lung and left base. Tiny left pleural effusion cannot be excluded. No pneumothorax. Degenerative change scratched it diffuse osteopenia. Degenerative changes and scoliosis cervicothoracic spine. Left posterolateral seventh rib fracture cannot be completely excluded. IMPRESSION: 1. Prominent right upper lung infiltrate versus mass noted. Lung cancer cannot be excluded. Close follow-up chest x-rays to demonstrate clearing suggested. 2. Atelectatic changes left mid lung and left base. Tiny left pleural effusion cannot be excluded. 3. Cardiomegaly.  No pulmonary venous congestion. 4. Left posterolateral seventh rib fracture cannot be completely excluded. No pneumothorax. Electronically Signed   By: Marcello Moores  Register   On: 07/23/2020 07:16   DG Hip Port Unilat With Pelvis 1V Right  Result Date: 07/25/2020 CLINICAL DATA:   Status  post RIGHT hip hemiarthroplasty EXAM: DG HIP (WITH OR WITHOUT PELVIS) 1V PORT RIGHT COMPARISON:  07/23/2020 radiograph FINDINGS: RIGHT hip hemiarthroplasty identified without complicating features. There is no evidence of dislocation. IMPRESSION: RIGHT hip hemiarthroplasty without complicating features. Electronically Signed   By: Margarette Canada M.D.   On: 07/25/2020 18:29   DG Hip Unilat With Pelvis 2-3 Views Right  Result Date: 07/23/2020 CLINICAL DATA:  Right hip pain after fall. EXAM: DG HIP (WITH OR WITHOUT PELVIS) 2-3V RIGHT COMPARISON:  CT 08/25/2017. FINDINGS: Slightly displaced and angulated right femoral neck fracture. Diffuse osteopenia. Degenerative changes lumbar spine and both hips. Pelvic calcifications consistent phleboliths. Peripheral vascular calcification. IMPRESSION: 1.  Slightly displaced and angulated right femoral neck fracture. 2.  Peripheral vascular disease. Electronically Signed   By: Marcello Moores  Register   On: 07/23/2020 07:11    Assessment/Plan 1. S/p left hip fracture - stable at this time, surgical incision CDI, no sign of infection, moving bowels with narcotics - continue daily dressing changes with clean gauze - continue eliquis for dvt prophylaxis - continue hydrocodone 5/325 mg q 6 hrs x 5 days, then transition to tylenol - apply ice to right hip tid - continue PT/OT  2. Essential hypertension - bp at goal < 150/90 - continue amlodipine, metoprolol, lisinopril - cbc/diff- 1 week - bmp- 1 week  3. Malignant neoplasm of middle lobe of right lung ([place]) - ongoing, receiving hospice care prior to hip fracture - intermittent wheezing noted in hospital, dulera and nebs given - lung sounds with diminished bases, no labored breathing, no oxygen use - palliative consult made  4. Pure hypercholesterolemia - stable, continue lipitor  5. Wheeze - intermittent wheezing noted in hospital, dulera and nebs given - lung sounds with diminished bases, no labored  breathing, no oxygen use - continue duoneb and dulera  6. History of stroke - continue eliquis  7. Benign prostatic hyperplasia with urinary obstruction - followed by urology for renal cancer in past - 20 F coude placed in operating room - he complains of catheter discomfort, urine in collection bag yellow and clear - follow up scheduled for 01/18 for foley removal and voiding trial   8. Pressure injury of right buttock, stage 2 ([place]) - continue daily dressing changes with medihoney and applying foam dressing - continue zinc, protein shakes, and multivitamin to aide healing   Family/ staff Communication: Plan discussed with patient and facility nurse  Labs/tests ordered:  Cbc/diff, bmp in 1 week

## 2020-08-02 ENCOUNTER — Encounter: Payer: Self-pay | Admitting: Internal Medicine

## 2020-08-02 ENCOUNTER — Non-Acute Institutional Stay (SKILLED_NURSING_FACILITY): Payer: Medicare Other | Admitting: Internal Medicine

## 2020-08-02 DIAGNOSIS — N138 Other obstructive and reflux uropathy: Secondary | ICD-10-CM

## 2020-08-02 DIAGNOSIS — Z8673 Personal history of transient ischemic attack (TIA), and cerebral infarction without residual deficits: Secondary | ICD-10-CM | POA: Diagnosis not present

## 2020-08-02 DIAGNOSIS — Z66 Do not resuscitate: Secondary | ICD-10-CM

## 2020-08-02 DIAGNOSIS — Z8781 Personal history of (healed) traumatic fracture: Secondary | ICD-10-CM | POA: Diagnosis not present

## 2020-08-02 DIAGNOSIS — N401 Enlarged prostate with lower urinary tract symptoms: Secondary | ICD-10-CM

## 2020-08-02 DIAGNOSIS — I10 Essential (primary) hypertension: Secondary | ICD-10-CM

## 2020-08-02 DIAGNOSIS — C342 Malignant neoplasm of middle lobe, bronchus or lung: Secondary | ICD-10-CM

## 2020-08-02 DIAGNOSIS — E7800 Pure hypercholesterolemia, unspecified: Secondary | ICD-10-CM

## 2020-08-02 NOTE — Progress Notes (Signed)
Provider:  Rexene Edison. Mariea Clonts, D.O., C.M.D. Location:  [place] Room Number: [place] of Service:  SNF (31)  PCP: Dion Body, MD Patient Care Team: Dion Body, MD as PCP - General (Family Medicine)  Extended Emergency Contact Information Primary Emergency Contact: Koleen Nimrod [place] Phone: [phone] Mobile Phone: [phone] Relation: Son  Code Status: DNR Goals of Care: Advanced Directive information Advanced Directives 08/02/2020  Does Patient Have a Medical Advance Directive? -  Type of Advance Directive Out of facility DNR (pink MOST or yellow form)  Does patient want to make changes to medical advance directive? No - Patient declined  Copy of [place] in Chart? -  Would patient like information on creating a medical advance directive? -  Pre-existing out of facility DNR order (yellow form or pink MOST form) Physician notified to receive inpatient order;Pink MOST/Yellow Form most recent copy in chart - Physician notified to receive inpatient order   Chief Complaint  Patient presents with  . New Admit To SNF    New Admit to [facility] SNF     HPI: Patient is a 87 y.o. male seen today for admission to [facility] s/p hospitalization 1/4-1/11/22 at [facility]. He has a PMH significant for htn, aortic atherosclerosis, CAD, lung cancer, GERD, OA, renal cell ca, BPH, weakness and a fall.    Per NP hospital f/u:  "Prior to hospitalization, he lived alone and was followed by [facility] hospice. 01/04 he tripped trying to ambulate to the bathroom. He developed severe right hip pain after. Unsure if he hit is head during fall. In the ED, WBC 9.5, INR 1.2, PTT 37, CK 50, Covid-19 negative, electrolytes unremarkable, renal function stable, vital signs normal. CXR indicated possible right upper lobe mass, cardiomegaly, and possible right 7th rib fracture. Xray right hip  showed displaced femoral neck fracture. Admitted for right hip fracture. Orthopedics consulted, right hemi arthroplasty performed by Dr. Mack Guise. He tolerated procedure well. Given morphine and percocet prn for pain, robaxin for muscle spasms. Given dulera and nebs for intermittent wheezing, no steroids given. Urology consulted due to difficulty placing foley in operating room. 37F coude placed and advised to f/u 1 week after discharge. SNF recommended for discharge."   He had his pfizer covid vaccine 1/14 and 2/4.  It appears he needs his booster.  When seen, the area on his buttocks is actually an abrasion from transfers.  His right hip incision is healing well--he's had considerable drainage and the dressing has been changed a few times.  He is for a voiding trial next week due to his foley in hopes it can be d/c'd.  He is to d/c back home with hospice.  Several family members had just been here for the care plan meeting.  He was very pleasant and convinced that I was going to [place] when I left the visit which we have no idea where that idea developed.    Past Medical History:  Diagnosis Date  . Arthritis   . Cancer ([place]) 45 years ago melanoma  . Enlarged prostate 11/21/14  . GERD (gastroesophageal reflux disease)   . History of kidney stones   . Hypertension   . Renal cancer ([place])   . Stroke [facility]) [id number]   slight weakness RT side   Past Surgical History:  Procedure Laterality Date  . CHOLECYSTECTOMY N/A   . CYSTOSCOPY WITH LITHOLAPAXY N/A 09/30/2015   Procedure: CYSTOSCOPY  WITH LITHOLAPAXY;  Surgeon: Hollice Espy, MD;  Location: [facility] ORS;  Service: Urology;  Laterality: N/A;  . HIP ARTHROPLASTY Right 07/25/2020   Procedure: ARTHROPLASTY BIPOLAR HIP (HEMIARTHROPLASTY);  Surgeon: Thornton Park, MD;  Location: [facility] ORS;  Service: Orthopedics;  Laterality: Right;  . HOLEP-LASER ENUCLEATION OF THE PROSTATE WITH MORCELLATION N/A 09/30/2015   Procedure: HOLEP-LASER ENUCLEATION OF  THE PROSTATE WITH MORCELLATION;  Surgeon: Hollice Espy, MD;  Location: [facility] ORS;  Service: Urology;  Laterality: N/A;  . IR GENERIC HISTORICAL  09/08/2016   IR RADIOLOGIST EVAL & MGMT 09/08/2016 Greggory Keen, MD [facility] INTERV RAD  . JOINT REPLACEMENT Bilateral 4-5 years ago   kness replaced   . LAPAROTOMY  2004   1 week after choley "kinicked bile duct"  . SKIN CANCER EXCISION  1973    Social History   Socioeconomic History  . Marital status: Single    Spouse name: Not on file  . Number of children: Not on file  . Years of education: Not on file  . Highest education level: Not on file  Occupational History  . Not on file  Tobacco Use  . Smoking status: Former Smoker    Types: Cigarettes    Quit date: 11/20/1968    Years since quitting: 51.7  . Smokeless tobacco: Never Used  Substance and Sexual Activity  . Alcohol use: No    Alcohol/week: 0.0 standard drinks  . Drug use: No  . Sexual activity: Not on file  Other Topics Concern  . Not on file  Social History Narrative  . Not on file   Social Determinants of Health   Financial Resource Strain: Not on file  Food Insecurity: Not on file  Transportation Needs: Not on file  Physical Activity: Not on file  Stress: Not on file  Social Connections: Not on file    reports that he quit smoking about 51 years ago. His smoking use included cigarettes. He has never used smokeless tobacco. He reports that he does not drink alcohol and does not use drugs.  Functional Status Survey:    Family History  Problem Relation Age of Onset  . Cancer Brother   . Cancer Sister   . Cancer Sister   . Cancer - Prostate Brother     Health Maintenance  Topic Date Due  . COVID-19 Vaccine (3 - Pfizer risk 4-dose series) 09/21/2019  . TETANUS/TDAP  09/22/2021  . INFLUENZA VACCINE  Completed  . PNA vac Low Risk Adult  Completed    No Known Allergies  Outpatient Encounter Medications as of 08/02/2020  Medication Sig  . acetaminophen  (TYLENOL) 325 MG tablet Take 1-2 tablets (325-650 mg total) by mouth every 6 (six) hours as needed for mild pain (pain score 1-3 or temp > 100.5).  Marland Kitchen amLODipine (NORVASC) 5 MG tablet Take 5 mg by mouth every morning.   Marland Kitchen apixaban (ELIQUIS) 5 MG TABS tablet Take 5 mg by mouth 2 (two) times daily.  Marland Kitchen atorvastatin (LIPITOR) 10 MG tablet Take 10 mg by mouth daily.  . beta carotene 25000 UNIT capsule Take 25,000 Units by mouth daily.  Marland Kitchen docusate sodium (COLACE) 100 MG capsule Take 1 capsule (100 mg total) by mouth 2 (two) times daily.  . feeding supplement (ENSURE ENLIVE / ENSURE PLUS) LIQD Take 237 mLs by mouth 3 (three) times daily between meals.  Marland Kitchen HYDROcodone-acetaminophen (NORCO/VICODIN) 5-325 MG tablet Take 1-2 tablets by mouth every 6 (six) hours as needed for moderate pain or severe pain.  Marland Kitchen  ipratropium-albuterol (DUONEB) 0.5-2.5 (3) MG/3ML SOLN Take 3 mLs by nebulization every 6 (six) hours.  Marland Kitchen lisinopril (PRINIVIL,ZESTRIL) 40 MG tablet Take 40 mg by mouth every morning.   . metoprolol (LOPRESSOR) 50 MG tablet Take 50 mg by mouth 2 (two) times daily.  . mometasone-formoterol (DULERA) 100-5 MCG/ACT AERO Inhale 2 puffs into the lungs 2 (two) times daily.  . Multiple Vitamin (MULTIVITAMIN WITH MINERALS) TABS tablet Take 1 tablet by mouth daily.  . [EXPIRED] nystatin (MYCOSTATIN) 100000 UNIT/ML suspension Take 5 mLs (500,000 Units total) by mouth 4 (four) times daily for 10 days.  . polyethylene glycol (MIRALAX / GLYCOLAX) 17 g packet Take 17 g by mouth daily as needed for mild constipation.  . potassium chloride SA (K-DUR,KLOR-CON) 20 MEQ tablet Take 20 mEq by mouth 2 (two) times daily.  . tamsulosin (FLOMAX) 0.4 MG CAPS capsule Take 1 capsule (0.4 mg total) by mouth daily after breakfast.  . Zinc 50 MG TABS Take 50 mg by mouth daily.   No facility-administered encounter medications on file as of 08/02/2020.    Review of Systems  Constitutional: Positive for malaise/fatigue. Negative for  chills and fever.  HENT: Positive for hearing loss. Negative for congestion and sore throat.   Eyes: Negative for blurred vision.  Respiratory: Positive for shortness of breath. Negative for cough.        Lung ca  Cardiovascular: Positive for leg swelling. Negative for chest pain and palpitations.  Gastrointestinal: Negative for abdominal pain, blood in stool, constipation and melena.  Genitourinary:       Foley in place to be d/cd  Musculoskeletal: Positive for falls and joint pain.  Skin:       Buttock abrasion   Neurological: Positive for weakness. Negative for dizziness and loss of consciousness.  Endo/Heme/Allergies: Bruises/bleeds easily.  Psychiatric/Behavioral: Positive for memory loss. Negative for depression. The patient is not nervous/anxious and does not have insomnia.     Vitals:   08/02/20 1110  BP: 132/81  Pulse: 79  Temp: (!) 97.3 F (36.3 C)  Weight: 188 lb 3.2 oz (85.4 kg)  Height: 5\' 10"  (1.778 m)   Body mass index is 27 kg/m. Physical Exam Vitals and nursing note reviewed.  Constitutional:      General: He is not in acute distress.    Appearance: Normal appearance. He is not toxic-appearing.     Comments: Pleasant male sitting up in wheelchair, conversant  HENT:     Head: Normocephalic and atraumatic.     Right Ear: External ear normal.     Left Ear: External ear normal.     Nose: Nose normal.     Mouth/Throat:     Pharynx: Oropharynx is clear.  Eyes:     Conjunctiva/sclera: Conjunctivae normal.     Pupils: Pupils are equal, round, and reactive to light.  Cardiovascular:     Rate and Rhythm: Normal rate and regular rhythm.  Pulmonary:     Effort: Pulmonary effort is normal.     Breath sounds: Rhonchi present.  Abdominal:     General: Bowel sounds are normal. There is no distension.     Tenderness: There is no abdominal tenderness. There is no guarding or rebound.  Musculoskeletal:     Cervical back: Neck supple.     Right lower leg: Edema  present.     Left lower leg: Edema present.     Comments: Using manual wheelchair  Lymphadenopathy:     Cervical: No cervical adenopathy.  Skin:  Comments: Abrasion of buttocks; right hip incision site with some drainage, but no erythema or swelling  Neurological:     Mental Status: He is alert.     Motor: Weakness present.     Gait: Gait abnormal.     Comments: Pleasant, confused  Psychiatric:        Mood and Affect: Mood normal.     Labs reviewed: Basic Metabolic Panel: Recent Labs    07/25/20 0322 07/26/20 0650 07/28/20 0611  NA 137 137 135  K 3.3* 3.2* 4.4  CL 98 98 99  CO2 30 29 28   GLUCOSE 99 158* 125*  BUN 18 22 21   CREATININE 0.62 0.62 0.53*  CALCIUM 8.0* 8.2* 8.2*   Liver Function Tests: Recent Labs    07/23/20 0631  AST 15  ALT 12  ALKPHOS 88  BILITOT 0.7  PROT 6.8  ALBUMIN 2.9*   No results for input(s): LIPASE, AMYLASE in the last 8760 hours. No results for input(s): AMMONIA in the last 8760 hours. CBC: Recent Labs    07/23/20 0631 07/24/20 0322 07/26/20 0650 07/27/20 0518 07/28/20 0611  WBC 9.5   < > 12.1* 13.3* 10.2  NEUTROABS 8.0*  --   --   --   --   HGB 12.1*   < > 10.7* 10.1* 10.3*  HCT 38.5*   < > 33.8* 30.9* 31.7*  MCV 77.9*   < > 78.1* 77.6* 77.5*  PLT 304   < > 276 271 238   < > = values in this interval not displayed.   Cardiac Enzymes: Recent Labs    07/23/20 0631  CKTOTAL 50   BNP: Invalid input(s): POCBNP No results found for: HGBA1C No results found for: TSH No results found for: VITAMINB12 No results found for: FOLATE No results found for: IRON, TIBC, FERRITIN  Imaging and Procedures obtained prior to SNF admission: CT HEAD WO CONTRAST  Result Date: 07/23/2020 CLINICAL DATA:  Tripped and fell while walking to the bathroom this morning, struck back of head history of metastatic renal cell carcinoma EXAM: CT HEAD WITHOUT CONTRAST TECHNIQUE: Contiguous axial images were obtained from the base of the skull through  the vertex without intravenous contrast. Sagittal and coronal MPR images reconstructed from axial data set. COMPARISON:  07/09/2014 FINDINGS: Brain: Generalized atrophy. Normal ventricular morphology. No midline shift or mass effect. Small vessel chronic ischemic changes of deep cerebral white matter. No intracranial hemorrhage, mass lesion, or evidence of acute infarction. No extra-axial fluid collections. Vascular: Atherosclerotic calcifications of internal carotid and vertebral arteries at skull base Skull: Demineralized but intact Sinuses/Orbits: Mucosal thickening in a few ethmoid air cells. Remaining visualized paranasal sinuses and mastoid air cells clear Other: N/A IMPRESSION: Atrophy with small vessel chronic ischemic changes of deep cerebral white matter. No acute intracranial abnormalities. Electronically Signed   By: Lavonia Dana M.D.   On: 07/23/2020 09:14   MR HIP RIGHT W WO CONTRAST  Result Date: 07/23/2020 CLINICAL DATA:  Fall with right hip fracture. History of renal cell carcinoma. EXAM: MRI OF THE RIGHT HIP WITHOUT AND WITH CONTRAST TECHNIQUE: Multiplanar, multisequence MR imaging was performed both before and after administration of intravenous contrast. CONTRAST:  7.66mL GADAVIST GADOBUTROL 1 MMOL/ML IV SOLN COMPARISON:  X-ray 07/23/2020 FINDINGS: Bones: Acute fracture of the right femoral neck with slight impaction and external rotation. Fracture line does not involve the intertrochanteric aspect of the femur or the humeral head articular surface. No additional fractures. No femoral head avascular necrosis. Destructive marrow  replacing lesion centered within the lateral aspect of the medullary space of the intertrochanteric right femur measuring approximately 3.5 x 3.2 x 3.3 cm (series 4, image 21; series 6, image 16). There is cortical breakthrough along the lateral cortex with a contiguous enhancing extraosseous soft tissue mass which measures approximately 2.2 x 1.0 x 1.7 cm (series 6,  image 14; series 8, image 20). There is marked bone marrow edema surrounding the mass. Mass occupies nearly 50% of the cross-sectional area of the femur. No complete pathologic fracture of the femur. Additional rounded 8 mm T1 hypointense, T2 hyperintense lesion within the posterior left iliac (series 3, image 11). Additional subcentimeter T2 hyperintense focus within the L4 vertebral body is nonspecific (series 3, image 20). Articular cartilage and labrum Articular cartilage: Mild diffuse chondral thinning and surface irregularity. Labrum:  Degenerated appearance of the superior labrum. Joint or bursal effusion Joint effusion:  No significant joint effusion. Bursae: No bursal fluid collections. Muscles and tendons Muscles and tendons: Intramuscular edema within the right gluteus minimus muscle. Prominent intramuscular edema and patchy enhancement within the proximal vastus lateralis and vastus intermediate muscles adjacent to the bone lesion. No acute tendinous injury. Other findings Miscellaneous: Prostate gland is enlarged with a heterogeneous, nodular configuration resulting in mass effect upon the base of the urinary bladder. Exophytic cyst emanating from the inferior pole of the left kidney measures approximately 2.1 cm. IMPRESSION: 1. Acute fracture of the right femoral neck with slight impaction and rotation. 2. Marrow replacing lesion within the intertrochanteric aspect of the proximal right femur measuring up to 3.5 cm. Cortical breakthrough along the lateral cortex with associated extra-osseous soft tissue mass. Surrounding bone marrow edema without pathologic fracture. Findings are highly suspicious for a metastatic lesion in this patient with known malignancy. 3. Intramuscular edema and enhancement within the proximal aspect of the vastus lateralis and vastus intermedius muscles adjacent to the proximal femoral mass, which may be reactive or reflect tumor infiltration. 4. Additional subcentimeter  lesions within the posterior left iliac bone and L4 vertebral body, which may represent additional metastatic lesions. 5. Enlarged, heterogeneous, nodular configuration of the prostate gland resulting in mass effect upon the base of the urinary bladder. Correlate with serum PSA. These results will be called to the ordering clinician or representative by the Radiologist Assistant, and communication documented in the PACS or [employer]. Electronically Signed   By: Davina Poke D.O.   On: 07/23/2020 10:51   DG Chest Port 1 View  Result Date: 07/23/2020 CLINICAL DATA:  Fall. EXAM: PORTABLE CHEST 1 VIEW COMPARISON:  CT 09/08/2017.  Chest x-ray 07/09/2014. FINDINGS: Mediastinum and hilar structures normal. Cardiomegaly. No pulmonary venous congestion. Prominent right upper lung infiltrate versus mass noted. Atelectatic changes left mid lung and left base. Tiny left pleural effusion cannot be excluded. No pneumothorax. Degenerative change scratched it diffuse osteopenia. Degenerative changes and scoliosis cervicothoracic spine. Left posterolateral seventh rib fracture cannot be completely excluded. IMPRESSION: 1. Prominent right upper lung infiltrate versus mass noted. Lung cancer cannot be excluded. Close follow-up chest x-rays to demonstrate clearing suggested. 2. Atelectatic changes left mid lung and left base. Tiny left pleural effusion cannot be excluded. 3. Cardiomegaly.  No pulmonary venous congestion. 4. Left posterolateral seventh rib fracture cannot be completely excluded. No pneumothorax. Electronically Signed   By: Marcello Moores  Register   On: 07/23/2020 07:16   DG Hip Unilat With Pelvis 2-3 Views Right  Result Date: 07/23/2020 CLINICAL DATA:  Right hip pain after fall. EXAM: DG HIP (  WITH OR WITHOUT PELVIS) 2-3V RIGHT COMPARISON:  CT 08/25/2017. FINDINGS: Slightly displaced and angulated right femoral neck fracture. Diffuse osteopenia. Degenerative changes lumbar spine and both hips. Pelvic  calcifications consistent phleboliths. Peripheral vascular calcification. IMPRESSION: 1.  Slightly displaced and angulated right femoral neck fracture. 2.  Peripheral vascular disease. Electronically Signed   By: Marcello Moores  Register   On: 07/23/2020 07:11    Assessment/Plan 1. DNR (do not resuscitate) - Do not attempt resuscitation (DNR) confirmed and entered into epic  2. S/p left hip fracture -recovering with PT, OT -goal to return home with hospice when able to do transfers safely  3. History of stroke -noted with weakness and some cognitive loss  4. Essential hypertension -bp at goal, cont same regimen  5. 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 Pure hypercholesterolemia -remains on statin therapy  7. Benign prostatic hyperplasia with urinary obstruction -cont flomax, for voiding trial next week in hopes of d/cing foley  Family/ staff Communication: d/w son and other family present   Labs/tests ordered:  No new added today  Audon Heymann L. Killian Ress, D.O. [facility] [address], [place] [address] Cell Phone (Mon-Fri 8am-5pm):  [phone] On Call:  [phone] & follow prompts after 5pm & weekends Office Phone:  [phone] Office Fax:  [phone]

## 2020-08-06 ENCOUNTER — Ambulatory Visit: Payer: Medicare Other | Admitting: Physician Assistant

## 2020-08-06 LAB — CBC AND DIFFERENTIAL
HCT: 32 — AB (ref 41–53)
Hemoglobin: 10.2 — AB (ref 13.5–17.5)
Platelets: 331 (ref 150–399)
WBC: 11.3

## 2020-08-06 LAB — COMPREHENSIVE METABOLIC PANEL
Calcium: 8.4 — AB (ref 8.7–10.7)
GFR calc Af Amer: >90
GFR calc non Af Amer: >90

## 2020-08-06 LAB — BASIC METABOLIC PANEL
BUN: 18 (ref 4–21)
CO2: 21 (ref 13–22)
Chloride: 102 (ref 99–108)
Creatinine: 0.6 (ref 0.6–1.3)
Glucose: 117
Potassium: 4.5 (ref 3.4–5.3)
Sodium: 138 (ref 137–147)

## 2020-08-06 LAB — CBC: RBC: 4.19 (ref 3.87–5.11)

## 2020-08-09 ENCOUNTER — Other Ambulatory Visit: Payer: Self-pay

## 2020-08-09 ENCOUNTER — Ambulatory Visit: Payer: Medicare Other | Admitting: Physician Assistant

## 2020-08-09 ENCOUNTER — Ambulatory Visit (INDEPENDENT_AMBULATORY_CARE_PROVIDER_SITE_OTHER): Payer: Medicare Other | Admitting: Physician Assistant

## 2020-08-09 DIAGNOSIS — N402 Nodular prostate without lower urinary tract symptoms: Secondary | ICD-10-CM

## 2020-08-09 DIAGNOSIS — T839XXA Unspecified complication of genitourinary prosthetic device, implant and graft, initial encounter: Secondary | ICD-10-CM

## 2020-08-09 DIAGNOSIS — R339 Retention of urine, unspecified: Secondary | ICD-10-CM | POA: Diagnosis not present

## 2020-08-09 LAB — BLADDER SCAN AMB NON-IMAGING

## 2020-08-09 MED ORDER — SULFAMETHOXAZOLE-TRIMETHOPRIM 800-160 MG PO TABS
1.0000 | ORAL_TABLET | Freq: Once | ORAL | Status: AC
  Administered 2020-08-09: 1 via ORAL

## 2020-08-09 NOTE — Progress Notes (Signed)
Simple Catheter Placement  Due to urinary retention patient is present today for a foley cath placement.  Patient was cleaned and prepped in a sterile fashion with betadine and 2 tubes of 2% lidocaine jelly were instilled into the urethra. A 20 FR silicone coud foley catheter was inserted, urine return was noted  122ml, urine was dark pink in color.  The balloon was filled with 20cc of sterile water and put on tension to tamponade the prostate.  A night bag was attached for drainage. Patient tolerated well, complications were noted as: Gross hematuria requiring hand irrigation to clear clots   Performed by: Debroah Loop, PA-C, Bradly Bienenstock, CMA, and Gaspar Cola, CMA  Bladder Irrigation  Due to gross hematuria/clot passage patient is present today for a bladder irrigation. Patient was cleaned and prepped in a sterile fashion. 200 ml of sterile water was instilled and irrigated into the bladder with a 44ml [place] syringe through the catheter in place.  After irrigation urine flow was noted pink without clots, no complications were noted catheter is now draining fine.  Catheter was reattached to the night bag for drainage. Patient tolerated well.   Performed by: Debroah Loop, PA-C

## 2020-08-09 NOTE — Progress Notes (Signed)
08/09/2020 9:46 AM   [name] [name] 02/01/1932 [id number]  CC: Chief Complaint  Patient presents with   Voiding trial   HPI: [name] [name] is a 87 y.o. male with PMH BPH s/p HOLEP with Dr. Erlene Quan in 2017 who underwent hip fracture repair with Dr. Mack Guise on 07/25/2020 for management of a pathologic right femoral neck fracture.  Dr. Diamantina Providence was consulted intraoperatively for difficult Foley placement.  He presents today for voiding trial.  He is accompanied today by his son, who contributes to HPI.  Today he reports feeling well.  He remains nonambulatory at outpatient rehab following his recent surgery.  He has a history of postoperative urinary retention in 2004 following cholecystectomy but no other history of urinary retention.  Notably, he is overdue for follow-up with Dr. Erlene Quan with PSA monitoring due to his history of prostate nodule on surveillance.  PMH: Past Medical History:  Diagnosis Date   Arthritis    Cancer ([place]) 45 years ago melanoma   Enlarged prostate 11/21/14   GERD (gastroesophageal reflux disease)    History of kidney stones    Hypertension    Renal cancer (HCC)    Stroke ([place]) [id number]   slight weakness RT side    Surgical History: Past Surgical History:  Procedure Laterality Date   CHOLECYSTECTOMY N/A    CYSTOSCOPY WITH LITHOLAPAXY N/A 09/30/2015   Procedure: CYSTOSCOPY WITH LITHOLAPAXY;  Surgeon: Hollice Espy, MD;  Location: [facility] ORS;  Service: Urology;  Laterality: N/A;   HIP ARTHROPLASTY Right 07/25/2020   Procedure: ARTHROPLASTY BIPOLAR HIP (HEMIARTHROPLASTY);  Surgeon: Thornton Park, MD;  Location: [facility] OR[facility];  Service: Orthopedics;  Laterality: Right;   HOLEP-LASER ENUCLEATION OF THE PROSTATE WITH MORCELLATION N/A 09/30/2015   Procedure: HOLEP-LASER ENUCLEATION OF THE PROSTATE WITH MORCELLATION;  Surgeon: Hollice Espy, MD;  Location: [facility] OR[facility];  Service: Urology;  Laterality: N/A;   IR GENERIC HISTORICAL  09/08/2016   IR RADIOLOGIST  EVAL & MGMT 09/08/2016 Greggory Keen, MD [facility] INTERV RAD   JOINT REPLACEMENT Bilateral 4-5 years ago   kness replaced    LAPAROTOMY  2004   1 week after choley "kinicked bile duct"   SKIN CANCER EXCISION  1973    Home Medications:  Allergies as of 08/09/2020   No Known Allergies     Medication List       Accurate as of August 09, 2020  9:46 AM. If you have any questions, ask your nurse or doctor.        acetaminophen 325 MG tablet Commonly known as: TYLENOL Take 1-2 tablets (325-650 mg total) by mouth every 6 (six) hours as needed for mild pain (pain score 1-3 or temp > 100.5).   amLODipine 5 MG tablet Commonly known as: NORVASC Take 5 mg by mouth every morning.   apixaban 5 MG Tabs tablet Commonly known as: ELIQUIS Take 5 mg by mouth 2 (two) times daily.   atorvastatin 10 MG tablet Commonly known as: LIPITOR Take 10 mg by mouth daily.   beta carotene 25000 UNIT capsule Take 25,000 Units by mouth daily.   docusate sodium 100 MG capsule Commonly known as: COLACE Take 1 capsule (100 mg total) by mouth 2 (two) times daily.   feeding supplement Liqd Take 237 mLs by mouth 3 (three) times daily between meals.   HYDROcodone-acetaminophen 5-325 MG tablet Commonly known as: NORCO/VICODIN Take 1-2 tablets by mouth every 6 (six) hours as needed for moderate pain or severe pain.   ipratropium-albuterol 0.5-2.5 (3) MG/3ML Soln  Commonly known as: DUONEB Take 3 mLs by nebulization every 6 (six) hours.   lisinopril 40 MG tablet Commonly known as: ZESTRIL Take 40 mg by mouth every morning.   metoprolol tartrate 50 MG tablet Commonly known as: LOPRESSOR Take 50 mg by mouth 2 (two) times daily.   mometasone-formoterol 100-5 MCG/ACT Aero Commonly known as: DULERA Inhale 2 puffs into the lungs 2 (two) times daily.   multivitamin with minerals Tabs tablet Take 1 tablet by mouth daily.   polyethylene glycol 17 g packet Commonly known as: MIRALAX / GLYCOLAX Take 17  g by mouth daily as needed for mild constipation.   potassium chloride SA 20 MEQ tablet Commonly known as: KLOR-CON Take 20 mEq by mouth 2 (two) times daily.   tamsulosin 0.4 MG Caps capsule Commonly known as: FLOMAX Take 1 capsule (0.4 mg total) by mouth daily after breakfast.   Zinc 50 MG Tabs Take 50 mg by mouth daily.       Allergies:  No Known Allergies  Family History: Family History  Problem Relation Age of Onset   Cancer Brother    Cancer Sister    Cancer Sister    Cancer - Prostate Brother     Social History:   reports that he quit smoking about 51 years ago. His smoking use included cigarettes. He has never used smokeless tobacco. He reports that he does not drink alcohol and does not use drugs.  Physical Exam: There were no vitals taken for this visit.  Constitutional:  Alert and oriented, no acute distress, nontoxic appearing HEENT: [place], AT Cardiovascular: No clubbing, cyanosis, or edema Respiratory: Normal respiratory effort, no increased work of breathing Skin: No rashes, bruises or suspicious lesions Neurologic: Grossly intact, no focal deficits, moving all 4 extremities Psychiatric: Normal mood and affect  Laboratory Data: Results for orders placed or performed in visit on 08/09/20  BLADDER SCAN AMB NON-IMAGING  Result Value Ref Range   Scan Result 13mL    Assessment & Plan:   1. Difficult Foley catheter placement ([place]) Foley catheter removed in the morning, see separate procedure note for details.  I attempted to fill and pull voiding trial, however patient had a robust bladder spasm with Foley removal with unmeasured efflux of urine.  Ultimately, I recommended that he push fluids and return to clinic in the afternoon for repeat PVR.  Patient returned to clinic this afternoon. He reports drinking several glasses of water. He has not had the urge to void. PVR 153mL. He was prompted to void but unable to do so.  I offered the patient Foley  replacement today and he accepted due to concerns for possible urinary retention over the weekend with anticipated inclement weather and known difficult Foley placement. See separate procedure note for details.  Patient had gross hematuria with catheter insertion consistent with prostatic bleeding in the setting of anticoagulant use. I irrigated his Foley and catheter was draining pink urine without clots upon completion. Foley balloon hyperinflated to 20ccs to tamponade the prostate. POCUS revealed a decompressed urinary bladder with Foley balloon inflated in the bladder.   Patient to hold anticoagulation to allow the urine to clear, no longer than 3 days. 1 dose Bactrim DS administered in clinic due to manipulation today. Will plan for repeat voiding trial mid-next week with early AM Foley removal at his facility. I provided the patient with explicit instructions regarding AM Foley removal that day with notice of hyperinflated Foley balloon AND spoke with nursing staff at [facility] to state the same. All expressed understanding. - BLADDER SCAN AMB NON-IMAGING - sulfamethoxazole-trimethoprim (BACTRIM DS) 800-160 MG per tablet 1 tablet  2. Prostate nodule Overdue for follow-up and PSA with Dr. Erlene Quan.  We will schedule this once he passes a voiding trial and defer PSA until scheduled follow-up due to catheter manipulation and concern for falsely elevated PSA.   Return in about 5 days (around 08/14/2020) for Voiding trial.  Debroah Loop, [facility]  [place] [address] [place], [place] [address] [phone]

## 2020-08-09 NOTE — Patient Instructions (Addendum)
1. Please hold anticoagulation until your urine clears, no longer than 3 days. 2. You are scheduled to return to my clinic next Wednesday in the afternoon. Nursing staff should remove your Foley catheter early that morning. PLEASE NOTE: 20ccs OF WATER ARE IN YOUR FOLEY BALLOON. 3. If you start to pass clots in your catheter or your catheter stops draining well, please have nursing staff irrigate your catheter with sterile saline or water.

## 2020-08-09 NOTE — Progress Notes (Signed)
Fill and Pull Catheter Removal  Patient is present today for a catheter removal.  Patient was cleaned and prepped in a sterile fashion 226ml of sterile saline was instilled into the bladder when the patient felt the urge to urinate. 4ml of water was then drained from the balloon.  A 20FR foley cath was removed from the bladder no complications were noted .  Patient had a robust bladder spasm with Foley removal with unmeasured efflux of pink urine.    Performed by: Debroah Loop, PA-C and Bradly Bienenstock, CMA  Follow up/ Additional notes: Push fluids and RTC this afternoon for PVR.

## 2020-08-12 ENCOUNTER — Telehealth: Payer: Self-pay | Admitting: *Deleted

## 2020-08-12 NOTE — Telephone Encounter (Signed)
[name] from [employer] with questions about the Foley. Per [employer] at 7am, there is no drainage into the bag, nurse tried irrigating and still nothing. Per nurse pt is having drainage out his penis. Nurse is asking if she should remove foley? Please advise

## 2020-08-12 NOTE — Telephone Encounter (Signed)
Verbally spoke with [name] [name] in the hallway, per [name] [name] is pt's bag completley dry? Is urine bloody?  Spoke with monette and pt did have about 127ml in his bag at 7am, pt urine per [facility] is " tea colored". When she tried irrigation fluid came out pt's penis.

## 2020-08-12 NOTE — Telephone Encounter (Signed)
Verbally spoke with [name] [name] and pt having bladder spasms and asking that the nurses watch pt and call if any problems  Spoke with monette at [facility] and advised results, also reminded her pt has appt on Wednesday.

## 2020-08-14 ENCOUNTER — Ambulatory Visit: Payer: Self-pay | Admitting: Physician Assistant

## 2020-08-14 ENCOUNTER — Encounter: Payer: Self-pay | Admitting: Physician Assistant

## 2020-08-14 ENCOUNTER — Other Ambulatory Visit: Payer: Self-pay

## 2020-08-14 ENCOUNTER — Ambulatory Visit (INDEPENDENT_AMBULATORY_CARE_PROVIDER_SITE_OTHER): Payer: Medicare Other | Admitting: Physician Assistant

## 2020-08-14 DIAGNOSIS — R339 Retention of urine, unspecified: Secondary | ICD-10-CM | POA: Diagnosis not present

## 2020-08-14 LAB — CBC: RBC: 3.87 (ref 3.87–5.11)

## 2020-08-14 LAB — CBC AND DIFFERENTIAL
HCT: 29 — AB (ref 41–53)
Hemoglobin: 9.5 — AB (ref 13.5–17.5)
Platelets: 320 (ref 150–399)
WBC: 6.6

## 2020-08-14 LAB — BLADDER SCAN AMB NON-IMAGING: Scan Result: 569

## 2020-08-14 MED ORDER — SULFAMETHOXAZOLE-TRIMETHOPRIM 800-160 MG PO TABS: 1.0000 | ORAL_TABLET | Freq: Two times a day (BID) | ORAL | Status: AC

## 2020-08-14 NOTE — Progress Notes (Signed)
08/14/2020 4:52 PM   [name] [name] 09-16-1931 [id number]  CC: Chief Complaint  Patient presents with  . Other   HPI: [name] [name] is a 87 y.o. male with PMH BPH s/p HOLEP with Dr. Erlene Quan in 2017, metastatic lung cancer who underwent hip fracture repair with Dr. Mack Guise on 07/25/2020 for management of a pathologic right femoral neck fracture, and difficult Foley placement who presents today for repeat voiding trial.  He is accompanied today by his son, who contributes to HPI.  Foley catheter removed by nursing staff at his facility this morning.  History difficult to elicit from the patient today due to cognitive status.  He is unsure if he has drank fluids today or voided.  PVR 569 mL.  He denies lower abdominal pain.  Patient's son reports plans to discharge the patient to hospice care in the son's home.  He reports his father's mental and health status have declined significantly in the last several weeks and he wishes to proceed with chronic indwelling Foley catheter at this time.  PMH: Past Medical History:  Diagnosis Date  . Arthritis   . Cancer ([place]) 45 years ago melanoma  . Enlarged prostate 11/21/14  . GERD (gastroesophageal reflux disease)   . History of kidney stones   . Hypertension   . Renal cancer ([place])   . Stroke [facility]) [id number]   slight weakness RT side    Surgical History: Past Surgical History:  Procedure Laterality Date  . CHOLECYSTECTOMY N/A   . CYSTOSCOPY WITH LITHOLAPAXY N/A 09/30/2015   Procedure: CYSTOSCOPY WITH LITHOLAPAXY;  Surgeon: Hollice Espy, MD;  Location: [facility] OR[facility];  Service: Urology;  Laterality: N/A;  . HIP ARTHROPLASTY Right 07/25/2020   Procedure: ARTHROPLASTY BIPOLAR HIP (HEMIARTHROPLASTY);  Surgeon: Thornton Park, MD;  Location: [facility] OR[facility];  Service: Orthopedics;  Laterality: Right;  . HOLEP-LASER ENUCLEATION OF THE PROSTATE WITH MORCELLATION N/A 09/30/2015   Procedure: HOLEP-LASER ENUCLEATION OF THE PROSTATE WITH MORCELLATION;  Surgeon: Hollice Espy, MD;  Location: [facility] ORS;  Service: Urology;  Laterality: N/A;  . IR GENERIC HISTORICAL  09/08/2016   IR RADIOLOGIST EVAL & MGMT 09/08/2016 Greggory Keen, MD [facility] INTERV RAD  . JOINT REPLACEMENT Bilateral 4-5 years ago   kness replaced   . LAPAROTOMY  2004   1 week after choley "kinicked bile duct"  . SKIN CANCER EXCISION  1973    Home Medications:  Allergies as of 08/14/2020   No Known Allergies     Medication List       Accurate as of August 14, 2020  4:52 PM. If you have any questions, ask your nurse or doctor.        acetaminophen 325 MG tablet Commonly known as: TYLENOL Take 1-2 tablets (325-650 mg total) by mouth every 6 (six) hours as needed for mild pain (pain score 1-3 or temp > 100.5).   amLODipine 5 MG tablet Commonly known as: NORVASC Take 5 mg by mouth every morning.   apixaban 5 MG Tabs tablet Commonly known as: ELIQUIS Take 5 mg by mouth 2 (two) times daily.   atorvastatin 10 MG tablet Commonly known as: LIPITOR Take 10 mg by mouth daily.   beta carotene 25000 UNIT capsule Take 25,000 Units by mouth daily.   docusate sodium 100 MG capsule Commonly known as: COLACE Take 1 capsule (100 mg total) by mouth 2 (two) times daily.   feeding supplement Liqd Take 237 mLs by mouth 3 (three) times daily between meals.   HYDROcodone-acetaminophen 5-325  MG tablet Commonly known as: NORCO/VICODIN Take 1-2 tablets by mouth every 6 (six) hours as needed for moderate pain or severe pain.   ipratropium-albuterol 0.5-2.5 (3) MG/3ML Soln Commonly known as: DUONEB Take 3 mLs by nebulization every 6 (six) hours.   lisinopril 40 MG tablet Commonly known as: ZESTRIL Take 40 mg by mouth every morning.   metoprolol tartrate 50 MG tablet Commonly known as: LOPRESSOR Take 50 mg by mouth 2 (two) times daily.   mometasone-formoterol 100-5 MCG/ACT Aero Commonly known as: DULERA Inhale 2 puffs into the lungs 2 (two) times daily.   multivitamin with minerals  Tabs tablet Take 1 tablet by mouth daily.   polyethylene glycol 17 g packet Commonly known as: MIRALAX / GLYCOLAX Take 17 g by mouth daily as needed for mild constipation.   potassium chloride SA 20 MEQ tablet Commonly known as: KLOR-CON Take 20 mEq by mouth 2 (two) times daily.   tamsulosin 0.4 MG Caps capsule Commonly known as: FLOMAX Take 1 capsule (0.4 mg total) by mouth daily after breakfast.   Zinc 50 MG Tabs Take 50 mg by mouth daily.      Allergies:  No Known Allergies  Family History: Family History  Problem Relation Age of Onset  . Cancer Brother   . Cancer Sister   . Cancer Sister   . Cancer - Prostate Brother     Social History:   reports that he quit smoking about 51 years ago. His smoking use included cigarettes. He has never used smokeless tobacco. He reports that he does not drink alcohol and does not use drugs.  Physical Exam: There were no vitals taken for this visit.  Constitutional:  Alert and oriented, no acute distress, nontoxic appearing HEENT: [place], AT Cardiovascular: No clubbing, cyanosis, or edema Respiratory: Normal respiratory effort, no increased work of breathing Skin: No rashes, bruises or suspicious lesions Neurologic: Grossly intact, no focal deficits, moving all 4 extremities Psychiatric: Normal mood and affect  Laboratory Data: Results for orders placed or performed in visit on 08/14/20  Bladder Scan (Post Void Residual) in office  Result Value Ref Range   Scan Result 569    Assessment & Plan:   1. Urinary retention Voiding trial failed today.  Foley catheter replaced in clinic today, see separate procedure note for details.  Notably, Foley catheter placement remains difficult in this patient and I ultimately required assistance from Dr. Diamantina Providence for successful placement.  Catheter subsequently irrigated to confirm appropriate placement.  I obtained a catheterized urine sample and sent it for culture in case he develops infective  symptoms and also administered 1 dose of Bactrim in clinic today.  I am in agreement with plans for chronic Foley catheterization in this patient.  I had a lengthy conversation with the patient and his son today in which I explained that we could plan for monthly catheter exchanges in our clinic.  Alternatively, I explained that hospice services may provide in-home catheter exchanges.  In this case, I remain very concerned about their ability to successfully place a catheter in this patient.  If they wish to pursue in-home catheter exchanges, I recommend placement of a suprapubic catheter, which would facilitate in-home exchanges.  Patient and son will consider this and follow-up with me as needed. - Bladder Scan (Post Void Residual) in office - Urinalysis, Complete - CULTURE, URINE COMPREHENSIVE - sulfamethoxazole-trimethoprim (BACTRIM DS) 800-160 MG per tablet 1 tablet   Return in about 4 weeks (around 09/11/2020) for Catheter exchange.  Debroah Loop, PA-C  [facility] [address], [place], [place] [address] [phone]

## 2020-08-14 NOTE — Progress Notes (Signed)
Simple Catheter Placement  Due to urinary retention patient is present today for a foley cath placement.  Patient was cleaned and prepped in a sterile fashion with betadine and 2% lidocaine jelly was instilled into the urethra. A 20 FR coud foley catheter was inserted, urine return was noted  580ml, urine was red-brown in color indicating no active bleeds.  The balloon was filled with 10cc of sterile water.  A night bag was attached for drainage. Patient was given instruction on proper catheter care.  Patient tolerated well, no complications were noted   Performed by: Debroah Loop, PA-C and Dr. Nickolas Madrid  Bladder Irrigation  Due to difficult Foley placement patient is present today for a bladder irrigation. Patient was cleaned and prepped in a sterile fashion. 60 ml of saline water was instilled and irrigated into the bladder with a 3ml [place] syringe through the catheter in place with immediate return of amber urine.  Appropriate placement confirmed; catheter attached to a night bag for drainage  Performed by: Dr. Diamantina Providence and Debroah Loop, PA-C

## 2020-08-15 ENCOUNTER — Other Ambulatory Visit: Payer: Self-pay | Admitting: Adult Health

## 2020-08-15 ENCOUNTER — Non-Acute Institutional Stay (SKILLED_NURSING_FACILITY): Payer: Medicare Other | Admitting: Adult Health

## 2020-08-15 ENCOUNTER — Encounter: Payer: Self-pay | Admitting: Adult Health

## 2020-08-15 DIAGNOSIS — C7801 Secondary malignant neoplasm of right lung: Secondary | ICD-10-CM | POA: Diagnosis not present

## 2020-08-15 DIAGNOSIS — S72001A Fracture of unspecified part of neck of right femur, initial encounter for closed fracture: Secondary | ICD-10-CM | POA: Diagnosis not present

## 2020-08-15 DIAGNOSIS — I700 Atherosclerosis of aorta: Secondary | ICD-10-CM

## 2020-08-15 DIAGNOSIS — C649 Malignant neoplasm of unspecified kidney, except renal pelvis: Secondary | ICD-10-CM | POA: Diagnosis not present

## 2020-08-15 MED ORDER — APIXABAN 5 MG PO TABS: 5.0000 mg | ORAL_TABLET | Freq: Two times a day (BID) | ORAL | 0 refills | Status: AC

## 2020-08-15 MED ORDER — IPRATROPIUM-ALBUTEROL 0.5-2.5 (3) MG/3ML IN SOLN: 3.0000 mL | Freq: Four times a day (QID) | RESPIRATORY_TRACT | 0 refills | Status: AC

## 2020-08-15 MED ORDER — LISINOPRIL 40 MG PO TABS: 40.0000 mg | ORAL_TABLET | Freq: Every morning | ORAL | 0 refills | Status: AC

## 2020-08-15 MED ORDER — MOMETASONE FURO-FORMOTEROL FUM 100-5 MCG/ACT IN AERO: 2.0000 | INHALATION_SPRAY | Freq: Two times a day (BID) | RESPIRATORY_TRACT | 0 refills | Status: AC

## 2020-08-15 MED ORDER — METOPROLOL TARTRATE 50 MG PO TABS: 50.0000 mg | ORAL_TABLET | Freq: Two times a day (BID) | ORAL | 0 refills | Status: AC

## 2020-08-15 MED ORDER — TAMSULOSIN HCL 0.4 MG PO CAPS: 0.4000 mg | ORAL_CAPSULE | Freq: Every day | ORAL | 0 refills | Status: AC

## 2020-08-15 MED ORDER — POTASSIUM CHLORIDE CRYS ER 20 MEQ PO TBCR: 20.0000 meq | EXTENDED_RELEASE_TABLET | Freq: Two times a day (BID) | ORAL | 0 refills | Status: AC

## 2020-08-15 MED ORDER — AMLODIPINE BESYLATE 5 MG PO TABS: 5.0000 mg | ORAL_TABLET | Freq: Every morning | ORAL | 0 refills | Status: AC

## 2020-08-15 MED ORDER — ATORVASTATIN CALCIUM 10 MG PO TABS: 10.0000 mg | ORAL_TABLET | Freq: Every day | ORAL | 0 refills | Status: AC

## 2020-08-15 MED ORDER — ONDANSETRON HCL 4 MG PO TABS: 4.0000 mg | ORAL_TABLET | Freq: Four times a day (QID) | ORAL | 0 refills | Status: AC | PRN

## 2020-08-15 NOTE — Progress Notes (Signed)
Location:    [place] Room Number: 108/P Place of Service:  SNF (31)    CODE STATUS: DNR  No Known Allergies  Chief Complaint  Patient presents with  . Discharge Note    Discharge Visit     HPI:  He is being discharged to home with hospice care. He will not participate in home health therapy. He will need a hospital bed; wheelchair; bedside table. He will need his prescriptions filled. He had been hospitalized for a right hip fracture. He had a right hip arthroplasty on 07-25-20. He has participated in pt/ot and is ready to return back home.   Past Medical History:  Diagnosis Date  . Arthritis   . Cancer ([place]) 45 years ago melanoma  . Enlarged prostate 11/21/14  . GERD (gastroesophageal reflux disease)   . History of kidney stones   . Hypertension   . Renal cancer ([place])   . Stroke [facility]) [id number]   slight weakness RT side    Past Surgical History:  Procedure Laterality Date  . CHOLECYSTECTOMY N/A   . CYSTOSCOPY WITH LITHOLAPAXY N/A 09/30/2015   Procedure: CYSTOSCOPY WITH LITHOLAPAXY;  Surgeon: Hollice Espy, MD;  Location: [facility] ORS;  Service: Urology;  Laterality: N/A;  . HIP ARTHROPLASTY Right 07/25/2020   Procedure: ARTHROPLASTY BIPOLAR HIP (HEMIARTHROPLASTY);  Surgeon: Thornton Park, MD;  Location: [facility] ORS;  Service: Orthopedics;  Laterality: Right;  . HOLEP-LASER ENUCLEATION OF THE PROSTATE WITH MORCELLATION N/A 09/30/2015   Procedure: HOLEP-LASER ENUCLEATION OF THE PROSTATE WITH MORCELLATION;  Surgeon: Hollice Espy, MD;  Location: [facility] ORS;  Service: Urology;  Laterality: N/A;  . IR GENERIC HISTORICAL  09/08/2016   IR RADIOLOGIST EVAL & MGMT 09/08/2016 Greggory Keen, MD [facility] INTERV RAD  . JOINT REPLACEMENT Bilateral 4-5 years ago   kness replaced   . LAPAROTOMY  2004   1 week after choley "kinicked bile duct"  . SKIN CANCER EXCISION  1973    Social History   Socioeconomic History  . Marital status: Single    Spouse name: Not  on file  . Number of children: Not on file  . Years of education: Not on file  . Highest education level: Not on file  Occupational History  . Not on file  Tobacco Use  . Smoking status: Former Smoker    Types: Cigarettes    Quit date: 11/20/1968    Years since quitting: 51.7  . Smokeless tobacco: Never Used  Substance and Sexual Activity  . Alcohol use: No    Alcohol/week: 0.0 standard drinks  . Drug use: No  . Sexual activity: Not on file  Other Topics Concern  . Not on file  Social History Narrative  . Not on file   Social Determinants of Health   Financial Resource Strain: Not on file  Food Insecurity: Not on file  Transportation Needs: Not on file  Physical Activity: Not on file  Stress: Not on file  Social Connections: Not on file  Intimate Partner Violence: Not on file   Family History  Problem Relation Age of Onset  . Cancer Brother   . Cancer Sister   . Cancer Sister   . Cancer - Prostate Brother     VITAL SIGNS BP 122/74   Pulse 82   Temp (!) 97.3 F (36.3 C)   Resp 18   Ht 5\' 10"  (1.778 m)   Wt 189 lb 9.6 oz (86 kg)   SpO2 94%   BMI 27.20 kg/m  Patient's Medications  New Prescriptions   No medications on file  Previous Medications   ACETAMINOPHEN (TYLENOL) 325 MG TABLET    Take 650 mg by mouth every 6 (six) hours as needed for mild pain. And temp   AMLODIPINE (NORVASC) 5 MG TABLET    Take 5 mg by mouth every morning.    APIXABAN (ELIQUIS) 5 MG TABS TABLET    Take 5 mg by mouth 2 (two) times daily.   ATORVASTATIN (LIPITOR) 10 MG TABLET    Take 10 mg by mouth daily.   BETA CAROTENE 26415 UNIT CAPSULE    Take 25,000 Units by mouth daily.   DOCUSATE SODIUM (COLACE) 100 MG CAPSULE    Take 1 capsule (100 mg total) by mouth 2 (two) times daily.   FEEDING SUPPLEMENT (ENSURE ENLIVE / ENSURE PLUS) LIQD    Take 237 mLs by mouth 3 (three) times daily between meals.   IPRATROPIUM-ALBUTEROL (DUONEB) 0.5-2.5 (3) MG/3ML SOLN    Take 3 mLs by nebulization every  6 (six) hours.   LISINOPRIL (PRINIVIL,ZESTRIL) 40 MG TABLET    Take 40 mg by mouth every morning.    METOPROLOL (LOPRESSOR) 50 MG TABLET    Take 50 mg by mouth 2 (two) times daily.   MOMETASONE-FORMOTEROL (DULERA) 100-5 MCG/ACT AERO    Inhale 2 puffs into the lungs 2 (two) times daily.   MULTIPLE VITAMIN (MULTIVITAMIN WITH MINERALS) TABS TABLET    Take 1 tablet by mouth daily.   ONDANSETRON (ZOFRAN) 4 MG TABLET    Take 4 mg by mouth every 6 (six) hours as needed for nausea or vomiting.   OXYGEN    Inhale 2 L into the lungs continuous.   POLYETHYLENE GLYCOL (MIRALAX / GLYCOLAX) 17 G PACKET    Take 17 g by mouth daily as needed for mild constipation.   POTASSIUM CHLORIDE SA (K-DUR,KLOR-CON) 20 MEQ TABLET    Take 20 mEq by mouth 2 (two) times daily.   TAMSULOSIN (FLOMAX) 0.4 MG CAPS CAPSULE    Take 1 capsule (0.4 mg total) by mouth daily after breakfast.   ZINC 50 MG TABS    Take 50 mg by mouth daily.  Modified Medications   No medications on file  Discontinued Medications   ACETAMINOPHEN (TYLENOL) 325 MG TABLET    Take 1-2 tablets (325-650 mg total) by mouth every 6 (six) hours as needed for mild pain (pain score 1-3 or temp > 100.5).   HYDROCODONE-ACETAMINOPHEN (NORCO/VICODIN) 5-325 MG TABLET    Take 1-2 tablets by mouth every 6 (six) hours as needed for moderate pain or severe pain.     SIGNIFICANT DIAGNOSTIC EXAMS   Review of Systems  Constitutional: Negative for malaise/fatigue.  Respiratory: Positive for cough. Negative for shortness of breath.   Cardiovascular: Negative for chest pain, palpitations and leg swelling.  Gastrointestinal: Negative for abdominal pain, constipation and heartburn.  Musculoskeletal: Negative for back pain, joint pain and myalgias.  Skin: Negative.   Neurological: Negative for dizziness.  Psychiatric/Behavioral: The patient is not nervous/anxious.      Physical Exam Constitutional:      General: He is not in acute distress.    Appearance: He is  well-developed and well-nourished. He is not diaphoretic.  Neck:     Thyroid: No thyromegaly.  Cardiovascular:     Rate and Rhythm: Normal rate and regular rhythm.     Pulses: Normal pulses and intact distal pulses.     Heart sounds: Normal heart sounds.  Pulmonary:  Effort: Pulmonary effort is normal. No respiratory distress.     Breath sounds: Wheezing present.  Abdominal:     General: Bowel sounds are normal. There is no distension.     Palpations: Abdomen is soft.     Tenderness: There is no abdominal tenderness.  Musculoskeletal:        General: No edema. Normal range of motion.     Cervical back: Neck supple.     Right lower leg: No edema.     Left lower leg: No edema.  Lymphadenopathy:     Cervical: No cervical adenopathy.  Skin:    General: Skin is warm and dry.  Neurological:     Mental Status: He is alert and oriented to person, place, and time.  Psychiatric:        Mood and Affect: Mood and affect and mood normal.       ASSESSMENT/ PLAN:   Patient is being discharged with the following home health services:  None: hospice care   Patient is being discharged with the following durable medical equipment:  Hospice will provide hospital bed; wheelchair and bedside table.   Patient has been advised to f/u with their PCP in 1-2 weeks to bring them up to date on their rehab stay.  Social services at facility was responsible for arranging this appointment.  Pt was provided with a 30 day supply of prescriptions for medications and refills must be obtained from their PCP.  For controlled substances, a more limited supply may be provided adequate until PCP appointment only.  Time spent with patient: 35 minutes; home health medications dme   Ok Edwards NP [place] [phone] Monday through Friday 8am- 5pm  After hours call [phone]

## 2020-08-16 LAB — URINALYSIS, COMPLETE
Bilirubin, UA: NEGATIVE
Glucose, UA: NEGATIVE
Ketones, UA: NEGATIVE
Leukocytes,UA: NEGATIVE
Nitrite, UA: NEGATIVE
Specific Gravity, UA: 1.02 (ref 1.005–1.030)
Urobilinogen, Ur: 0.2 mg/dL (ref 0.2–1.0)
pH, UA: 5 (ref 5.0–7.5)

## 2020-08-16 LAB — MICROSCOPIC EXAMINATION: RBC, Urine: >30 /hpf — AB (ref 0–2)

## 2020-08-18 LAB — CULTURE, URINE COMPREHENSIVE

## 2020-09-16 ENCOUNTER — Ambulatory Visit: Payer: Self-pay | Admitting: Physician Assistant

## 2020-09-17 DEATH — deceased
